# Patient Record
Sex: Female | Born: 1955 | Race: White
Health system: Southern US, Community
[De-identification: ages and names within clinical notes are randomized; demographics above are authoritative.]

## PROBLEM LIST (undated history)

## (undated) DIAGNOSIS — F329 Major depressive disorder, single episode, unspecified: Secondary | ICD-10-CM

## (undated) DIAGNOSIS — R001 Bradycardia, unspecified: Secondary | ICD-10-CM

## (undated) DIAGNOSIS — F32A Depression, unspecified: Secondary | ICD-10-CM

## (undated) DIAGNOSIS — F0390 Unspecified dementia without behavioral disturbance: Secondary | ICD-10-CM

## (undated) DIAGNOSIS — E876 Hypokalemia: Secondary | ICD-10-CM

## (undated) DIAGNOSIS — G309 Alzheimer's disease, unspecified: Secondary | ICD-10-CM

## (undated) DIAGNOSIS — F062 Psychotic disorder with delusions due to known physiological condition: Secondary | ICD-10-CM

## (undated) DIAGNOSIS — C50919 Malignant neoplasm of unspecified site of unspecified female breast: Secondary | ICD-10-CM

## (undated) DIAGNOSIS — I959 Hypotension, unspecified: Secondary | ICD-10-CM

## (undated) DIAGNOSIS — F0280 Dementia in other diseases classified elsewhere without behavioral disturbance: Secondary | ICD-10-CM

## (undated) HISTORY — PX: BREAST BIOPSY: SHX20

## (undated) HISTORY — DX: Hypotension, unspecified: I95.9

## (undated) HISTORY — DX: Hypokalemia: E87.6

## (undated) HISTORY — DX: Malignant neoplasm of unspecified site of unspecified female breast: C50.919

## (undated) HISTORY — DX: Depression, unspecified: F32.A

## (undated) HISTORY — DX: Major depressive disorder, single episode, unspecified: F32.9

## (undated) HISTORY — DX: Unspecified dementia, unspecified severity, without behavioral disturbance, psychotic disturbance, mood disturbance, and anxiety: F03.90

## (undated) HISTORY — PX: ABDOMINAL HYSTERECTOMY: SHX81

## (undated) HISTORY — DX: Dementia in other diseases classified elsewhere, unspecified severity, without behavioral disturbance, psychotic disturbance, mood disturbance, and anxiety: F02.80

## (undated) HISTORY — DX: Psychotic disorder with delusions due to known physiological condition: F06.2

## (undated) HISTORY — DX: Alzheimer's disease, unspecified: G30.9

## (undated) HISTORY — DX: Bradycardia, unspecified: R00.1

---

## 2018-03-29 ENCOUNTER — Other Ambulatory Visit (HOSPITAL_COMMUNITY): Payer: Self-pay | Admitting: Geriatric Medicine

## 2018-03-29 DIAGNOSIS — N63 Unspecified lump in unspecified breast: Secondary | ICD-10-CM

## 2018-04-04 ENCOUNTER — Ambulatory Visit (HOSPITAL_COMMUNITY)
Admission: RE | Admit: 2018-04-04 | Discharge: 2018-04-04 | Disposition: A | Discharge status: Home / Self Care | Payer: Medicaid Other | Source: Ambulatory Visit | Attending: Geriatric Medicine | Admitting: Geriatric Medicine

## 2018-04-04 ENCOUNTER — Other Ambulatory Visit (HOSPITAL_COMMUNITY): Payer: Self-pay | Admitting: Geriatric Medicine

## 2018-04-04 ENCOUNTER — Encounter (HOSPITAL_COMMUNITY): Payer: Self-pay | Admitting: Radiology

## 2018-04-04 DIAGNOSIS — N6320 Unspecified lump in the left breast, unspecified quadrant: Secondary | ICD-10-CM

## 2018-04-04 DIAGNOSIS — N63 Unspecified lump in unspecified breast: Secondary | ICD-10-CM

## 2018-04-04 DIAGNOSIS — N6321 Unspecified lump in the left breast, upper outer quadrant: Secondary | ICD-10-CM | POA: Insufficient documentation

## 2018-04-11 ENCOUNTER — Other Ambulatory Visit (HOSPITAL_COMMUNITY): Payer: Medicaid Other

## 2018-04-11 ENCOUNTER — Other Ambulatory Visit (HOSPITAL_COMMUNITY): Payer: Self-pay | Admitting: Family Medicine

## 2018-04-11 ENCOUNTER — Ambulatory Visit (HOSPITAL_COMMUNITY)
Admission: RE | Admit: 2018-04-11 | Discharge: 2018-04-11 | Disposition: A | Discharge status: Home / Self Care | Payer: Medicaid Other | Source: Ambulatory Visit | Attending: Geriatric Medicine | Admitting: Geriatric Medicine

## 2018-04-11 ENCOUNTER — Ambulatory Visit (HOSPITAL_COMMUNITY)
Admission: RE | Admit: 2018-04-11 | Discharge: 2018-04-11 | Disposition: A | Discharge status: Home / Self Care | Payer: Medicaid Other | Source: Ambulatory Visit | Attending: Family Medicine | Admitting: Family Medicine

## 2018-04-11 ENCOUNTER — Encounter (HOSPITAL_COMMUNITY): Payer: Self-pay

## 2018-04-11 DIAGNOSIS — R928 Other abnormal and inconclusive findings on diagnostic imaging of breast: Secondary | ICD-10-CM

## 2018-04-11 DIAGNOSIS — C50412 Malignant neoplasm of upper-outer quadrant of left female breast: Secondary | ICD-10-CM | POA: Insufficient documentation

## 2018-04-11 DIAGNOSIS — N6321 Unspecified lump in the left breast, upper outer quadrant: Secondary | ICD-10-CM | POA: Diagnosis present

## 2018-04-11 DIAGNOSIS — C773 Secondary and unspecified malignant neoplasm of axilla and upper limb lymph nodes: Secondary | ICD-10-CM | POA: Insufficient documentation

## 2018-04-11 DIAGNOSIS — N6320 Unspecified lump in the left breast, unspecified quadrant: Secondary | ICD-10-CM

## 2018-04-11 MED ORDER — LIDOCAINE HCL (PF) 1 % IJ SOLN
INTRAMUSCULAR | Status: AC
  Filled 2018-04-11: qty 5

## 2018-04-11 MED ORDER — LIDOCAINE-EPINEPHRINE (PF) 1 %-1:200000 IJ SOLN
INTRAMUSCULAR | Status: AC
  Administered 2018-04-11: 6 mL
  Filled 2018-04-11: qty 30

## 2018-04-11 MED ORDER — LIDOCAINE HCL (PF) 1 % IJ SOLN
INTRAMUSCULAR | Status: AC
  Administered 2018-04-11: 5 mL
  Filled 2018-04-11: qty 5

## 2018-04-28 ENCOUNTER — Inpatient Hospital Stay (HOSPITAL_COMMUNITY): Payer: Medicaid Other

## 2018-04-28 ENCOUNTER — Encounter (HOSPITAL_COMMUNITY): Payer: Self-pay | Admitting: Internal Medicine

## 2018-04-28 ENCOUNTER — Inpatient Hospital Stay (HOSPITAL_COMMUNITY): Payer: Medicaid Other | Attending: Internal Medicine | Admitting: Internal Medicine

## 2018-04-28 VITALS — BP 117/62 | HR 66 | Temp 98.1°F | Resp 14 | Ht 61.5 in | Wt 160.0 lb

## 2018-04-28 DIAGNOSIS — Z87891 Personal history of nicotine dependence: Secondary | ICD-10-CM | POA: Diagnosis not present

## 2018-04-28 DIAGNOSIS — R911 Solitary pulmonary nodule: Secondary | ICD-10-CM | POA: Insufficient documentation

## 2018-04-28 DIAGNOSIS — C50812 Malignant neoplasm of overlapping sites of left female breast: Secondary | ICD-10-CM

## 2018-04-28 DIAGNOSIS — F0390 Unspecified dementia without behavioral disturbance: Secondary | ICD-10-CM | POA: Diagnosis not present

## 2018-04-28 DIAGNOSIS — Z171 Estrogen receptor negative status [ER-]: Secondary | ICD-10-CM | POA: Diagnosis not present

## 2018-04-28 LAB — CBC WITH DIFFERENTIAL/PLATELET
BASOS ABS: 0 10*3/uL (ref 0.0–0.1)
BASOS PCT: 0 %
Eosinophils Absolute: 0.1 10*3/uL (ref 0.0–0.7)
Eosinophils Relative: 1 %
HEMATOCRIT: 40.9 % (ref 36.0–46.0)
Hemoglobin: 13.1 g/dL (ref 12.0–15.0)
LYMPHS ABS: 2.7 10*3/uL (ref 0.7–4.0)
LYMPHS PCT: 35 %
MCH: 29.6 pg (ref 26.0–34.0)
MCHC: 32 g/dL (ref 30.0–36.0)
MCV: 92.3 fL (ref 78.0–100.0)
MONO ABS: 0.8 10*3/uL (ref 0.1–1.0)
Monocytes Relative: 10 %
NEUTROS ABS: 4.1 10*3/uL (ref 1.7–7.7)
NEUTROS PCT: 54 %
Platelets: 267 10*3/uL (ref 150–400)
RBC: 4.43 MIL/uL (ref 3.87–5.11)
RDW: 13.5 % (ref 11.5–15.5)
WBC: 7.7 10*3/uL (ref 4.0–10.5)

## 2018-04-28 LAB — COMPREHENSIVE METABOLIC PANEL
ALBUMIN: 3.9 g/dL (ref 3.5–5.0)
ALT: 14 U/L (ref 0–44)
AST: 16 U/L (ref 15–41)
Alkaline Phosphatase: 80 U/L (ref 38–126)
Anion gap: 9 (ref 5–15)
BILIRUBIN TOTAL: 0.6 mg/dL (ref 0.3–1.2)
BUN: 12 mg/dL (ref 8–23)
CALCIUM: 9.2 mg/dL (ref 8.9–10.3)
CO2: 28 mmol/L (ref 22–32)
CREATININE: 0.64 mg/dL (ref 0.44–1.00)
Chloride: 103 mmol/L (ref 98–111)
GFR calc Af Amer: >60 mL/min (ref >60)
GLUCOSE: 117 mg/dL — AB (ref 70–99)
Potassium: 4 mmol/L (ref 3.5–5.1)
Sodium: 140 mmol/L (ref 135–145)
TOTAL PROTEIN: 7.5 g/dL (ref 6.5–8.1)

## 2018-04-28 LAB — LACTATE DEHYDROGENASE: LDH: 135 U/L (ref 98–192)

## 2018-04-28 NOTE — Progress Notes (Signed)
Referring Physician:  [facility]  Diagnosis Malignant neoplasm of overlapping sites of left breast in female, estrogen receptor negative ([place]) - Plan: CBC with Differential/Platelet, Comprehensive metabolic panel, Lactate dehydrogenase, Cancer antigen 27.29, Cancer antigen 15-3, NM PET Image Initial (PI) Skull Base To Thigh, ECHOCARDIOGRAM COMPLETE  Staging Cancer Staging No matching staging information was found for the patient.  Assessment and Plan: 1.  Stage II left breast cancer.  62 yr old female who resides in [facility] and has been in nursing facility since 04/2017.  She reports she felt an area in left breast and underwent evaluation with bilateral diagnostic mammogram on 04/04/2018 that showed  IMPRESSION: 1. Highly suspicious left breast mass with associated calcifications measuring 4.6 cm sonographically.  2. Suspicious lymph node in the left axilla with focal nodular cortical thickening.  3.  No findings of malignancy in the right breast.  She had a biopsy of the left breast lesion 2:00 position and left axillary lymph node on 04/11/2018.  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  I have discussed with the patient, nursing home worker, and her sister who has power of attorney via phone that patient currently has a stage II breast cancer.  She will be recommended for staging evaluation with a PET scan.  I have also discussed with them due to the findings of HER-2 positive breast cancer and the size of the lesion in the left breast as well as axillary adenopathy she will be recommended for neoadjuvant chemotherapy with a Herceptin based regimen.  Potential side effects of medications were reviewed with her sister who is power of attorney via phone.  The patient is set up for PET scan for staging as well as echo for heart function evaluation prior to therapy.   She will return to clinic to go over  the results.  She will not be a candidate for endocrine therapy as her tumor is hormone receptor negative.  She will also be referred to surgery for evaluation for port placement.  She will be set up for chemotherapy teaching once the results of her scan and ECHO  have been reviewed and treatment plan finalized.  Baseline labs obtained today and reviewed and were WNL.  All questions answered and pt expressed understanding of the information presented.    2.  Dementia.  Sister reports this affected several family members.  Pt is alert and conversant.  She is currently in [facility].    3.  Health maintenance.  Pt was not getting mammograms since being in facility.  She is not sure when last colonoscopy was done.    Greater than 60 minutes spent with more than 50% spent in counseling and coordination of care.    HPI: 62 year old female referred for evaluation due to breast cancer.  She is accompanied by nursing home worker.  Patient reportedly has been in nursing facility since September 2018.  She reportedly was diagnosed with dementia which reportedly runs in her family.  Power of attorney is available via phone.  The patient reportedly noted an area in the left breast and brought that to the facility's attention.       She subsequently had bilateral diagnostic mammogram done on 04/04/2018 that showed  IMPRESSION: 1. Highly suspicious left breast mass with associated calcifications measuring 4.6 cm sonographically.  2. Suspicious lymph node in the left axilla with focal nodular cortical thickening.  3.  No findings of malignancy in the right breast.  RECOMMENDATION: 1. Recommend ultrasound-guided biopsy of the palpable mass in the left breast at the 1 to 2 o'clock position.  2. Recommend ultrasound-guided biopsy of the abnormal lymph node in the left axilla.     Patient underwent ultrasound-guided biopsy of the left breast on 04/11/2018.  Clip was placed. Pathology returned as  : ADDITIONAL INFORMATION: 1. Immunohistochemical stain for E-cadherin is positive, consistent with a ductal phenotype. Jaquita Folds MD Pathologist, Electronic Signature ( Signed 04/14/2018) 1. PROGNOSTIC INDICATORS Results: IMMUNOHISTOCHEMICAL AND MORPHOMETRIC ANALYSIS PERFORMED MANUALLY The tumor cells are Positive for Her2 (3+). Estrogen Receptor: 0%, NEGATIVE Progesterone Receptor: 0%, NEGATIVE Proliferation Marker Ki67: 50% REFERENCE RANGE ESTROGEN RECEPTOR NEGATIVE 0% POSITIVE =>1% REFERENCE RANGE PROGESTERONE RECEPTOR NEGATIVE 0% POSITIVE =>1% All controls stained appropriately Vicente Males MD Pathologist, Electronic Signature ( Signed 04/14/2018) FINAL DIAGNOSIS 1 of 3 FINAL for [place], [place] ([id number]) Diagnosis 1. Breast, left, needle core biopsy, 2:00 - INVASIVE MAMMARY CARCINOMA. SEE NOTE. 2. Lymph node, needle/core biopsy, left axilla - INVOLVED BY MAMMARY CARCINOMA  Patient is doing well since biopsy.  She denies any headache, blurred vision, shortness of breath, weight loss, abdominal pain.  She is seen today for consultation due to recent diagnosis of left breast cancer.  Problem List There are no active problems to display for this patient.   Past Medical History Past Medical History:  Diagnosis Date  . Alzheimer's dementia   . Bradycardia   . Breast cancer ([place])   . Dementia   . Depression   . Hypokalemia   . Hypotension   . 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 BREAST BIOPSY Left     Family History History reviewed. No pertinent family history.   Social History  has an unknown smoking status. She has never used smokeless tobacco. She reports that she drank alcohol. She reports that she does not use drugs.  Medications  Current Outpatient Medications:  .  ALPRAZolam (XANAX) 0.25 MG tablet, Take 0.25 mg by mouth 4 (four)  times daily as needed for anxiety., Disp: , Rfl:  .  cholecalciferol (VITAMIN D) 1000 units tablet, Take 1,000 Units by mouth daily., Disp: , Rfl:  .  divalproex (DEPAKOTE) 250 MG DR tablet, Take 250 mg by mouth at bedtime., Disp: , Rfl:  .  divalproex (DEPAKOTE) 500 MG DR tablet, Take 500 mg by mouth every morning., Disp: , Rfl:  .  donepezil (ARICEPT) 10 MG tablet, Take 10 mg by mouth at bedtime., Disp: , Rfl:  .  memantine (NAMENDA) 10 MG tablet, Take 10 mg by mouth 2 (two) times daily., Disp: , Rfl:  .  QUEtiapine (SEROQUEL) 25 MG tablet, Take 12.5 mg by mouth 3 (three) times daily., Disp: , Rfl:  .  sertraline (ZOLOFT) 100 MG tablet, Take 100 mg by mouth daily., Disp: , Rfl:   Allergies Patient has no known allergies.  Review of Systems Review of Systems - Oncology ROS negative.     Physical Exam  Vitals Wt Readings from Last 3 Encounters:  04/28/18 160 lb (72.6 kg)   Temp Readings from Last 3 Encounters:  04/28/18 98.1 F (36.7 C) (Oral)  04/11/18 (!) 97.5 F (36.4 C) (Oral)   BP Readings from Last 3 Encounters:  04/28/18 117/62  04/11/18 (!) 127/56   Pulse Readings from Last 3 Encounters:  04/28/18 66  04/11/18 60  Constitutional: Well-developed, well-nourished, and in no distress.   HENT: Head: Normocephalic and atraumatic.  Mouth/Throat: No oropharyngeal exudate. Mucosa moist. Eyes: Pupils are equal, round, and reactive to light. Conjunctivae are normal. No scleral icterus.  Neck: Normal range of motion. Neck supple. No JVD present.  Cardiovascular: Normal rate, regular rhythm and normal heart sounds.  Exam reveals no gallop and no friction rub.   No murmur heard. Pulmonary/Chest: Effort normal and breath sounds normal. No respiratory distress. No wheezes.No rales.  Abdominal: Soft. Bowel sounds are normal. No distension. There is no tenderness. There is no guarding.  Musculoskeletal: No edema or tenderness.  Lymphadenopathy: No cervical, axillary or  supraclavicular adenopathy.  Neurological: Alert and oriented to person, place, and time. No cranial nerve deficit.  Skin: Skin is warm and dry. No rash noted. No erythema. No pallor.  Psychiatric: Affect and judgment normal.  Breast exam:  Palpable 4 cm lesion in left breast at 2:00 position.  Right breast shows no dominant palpable masses.    Labs Appointment on 04/28/2018  Component Date Value Ref Range Status  . WBC 04/28/2018 7.7  4.0 - 10.5 K/uL Final  . RBC 04/28/2018 4.43  3.87 - 5.11 MIL/uL Final  . Hemoglobin 04/28/2018 13.1  12.0 - 15.0 g/dL Final  . HCT 04/28/2018 40.9  36.0 - 46.0 % Final  . MCV 04/28/2018 92.3  78.0 - 100.0 fL Final  . MCH 04/28/2018 29.6  26.0 - 34.0 pg Final  . MCHC 04/28/2018 32.0  30.0 - 36.0 g/dL Final  . RDW 04/28/2018 13.5  11.5 - 15.5 % Final  . Platelets 04/28/2018 267  150 - 400 K/uL Final  . Neutrophils Relative % 04/28/2018 54  % Final  . Neutro Abs 04/28/2018 4.1  1.7 - 7.7 K/uL Final  . Lymphocytes Relative 04/28/2018 35  % Final  . Lymphs Abs 04/28/2018 2.7  0.7 - 4.0 K/uL Final  . Monocytes Relative 04/28/2018 10  % Final  . Monocytes Absolute 04/28/2018 0.8  0.1 - 1.0 K/uL Final  . Eosinophils Relative 04/28/2018 1  % Final  . Eosinophils Absolute 04/28/2018 0.1  0.0 - 0.7 K/uL Final  . Basophils Relative 04/28/2018 0  % Final  . Basophils Absolute 04/28/2018 0.0  0.0 - 0.1 K/uL Final   Performed at [facility], [address] [place], [place] [address]  . Sodium 04/28/2018 140  135 - 145 mmol/L Final  . Potassium 04/28/2018 4.0  3.5 - 5.1 mmol/L Final  . Chloride 04/28/2018 103  98 - 111 mmol/L Final  . CO2 04/28/2018 28  22 - 32 mmol/L Final  . Glucose, Bld 04/28/2018 117* 70 - 99 mg/dL Final  . BUN 04/28/2018 12  8 - 23 mg/dL Final  . Creatinine, Ser 04/28/2018 0.64  0.44 - 1.00 mg/dL Final  . Calcium 04/28/2018 9.2  8.9 - 10.3 mg/dL Final  . Total Protein 04/28/2018 7.5  6.5 - 8.1 g/dL Final  . Albumin 04/28/2018 3.9  3.5 -  5.0 g/dL Final  . AST 04/28/2018 16  15 - 41 U/L Final  . ALT 04/28/2018 14  0 - 44 U/L Final  . Alkaline Phosphatase 04/28/2018 80  38 - 126 U/L Final  . Total Bilirubin 04/28/2018 0.6  0.3 - 1.2 mg/dL Final  . GFR calc non Af Amer 04/28/2018 >60  >60 mL/min Final  . GFR calc Af Amer 04/28/2018 >60  >60 mL/min Final   Comment: (NOTE) The eGFR has been calculated using the CKD  EPI equation. This calculation has not been validated in all clinical situations. eGFR's persistently <60 mL/min signify possible Chronic Kidney Disease.   Georgiann Hahn gap 04/28/2018 9  5 - 15 Final   Performed at [facility], [address] [place], [place] [address]  . LDH 04/28/2018 135  98 - 192 U/L Final   Performed at [facility], [address] [place], [place] [address]     Pathology Orders Placed This Encounter  Procedures  . NM PET Image Initial (PI) Skull Base To Thigh    Standing Status:   Future    Standing Expiration Date:   04/28/2019    Order Specific Question:   If indicated for the ordered procedure, I authorize the administration of a radiopharmaceutical per Radiology protocol    Answer:   Yes    Order Specific Question:   Preferred imaging location?    Answer:   [facility]    Order Specific Question:   Radiology Contrast Protocol - do NOT remove file path    Answer:   [email or web address]  . CBC with Differential/Platelet    Standing Status:   Future    Number of Occurrences:   1    Standing Expiration Date:   04/29/2019  . Comprehensive metabolic panel    Standing Status:   Future    Number of Occurrences:   1    Standing Expiration Date:   04/29/2019  . Lactate dehydrogenase    Standing Status:   Future    Number of Occurrences:   1    Standing Expiration Date:   04/29/2019  . Cancer antigen 27.29    Standing Status:   Future    Number of Occurrences:   1    Standing Expiration Date:   04/29/2019  . Cancer antigen 15-3    Standing Status:   Future     Number of Occurrences:   1    Standing Expiration Date:   04/29/2019  . ECHOCARDIOGRAM COMPLETE    Standing Status:   Future    Standing Expiration Date:   07/29/2019    Order Specific Question:   Where should this test be performed    Answer:   [facility]    Order Specific Question:   Perflutren DEFINITY (image enhancing agent) should be administered unless hypersensitivity or allergy exist    Answer:   Administer Perflutren    Order Specific Question:   Other Comments    Answer:   heart function evaluation prior to herceptin       Zoila Shutter MD

## 2018-04-28 NOTE — Patient Instructions (Signed)
[facility] Discharge Instructions  You saw Dr. Higgs today.   Thank you for choosing [facility] at [facility] to provide your oncology and hematology care.  To afford each patient quality time with our provider, please arrive at least 15 minutes before your scheduled appointment time.   If you have a lab appointment with the Cancer Center please come in thru the  Main Entrance and check in at the main information desk  You need to re-schedule your appointment should you arrive 10 or more minutes late.  We strive to give you quality time with our providers, and arriving late affects you and other patients whose appointments are after yours.  Also, if you no show three or more times for appointments you may be dismissed from the clinic at the providers discretion.     Again, thank you for choosing [facility].  Our hope is that these requests will decrease the amount of time that you wait before being seen by our physicians.       _____________________________________________________________  Should you have questions after your visit to [facility], please contact our office at [phone] between the hours of 8:00 a.m. and 4:30 p.m.  Voicemails left after 4:00 p.m. will not be returned until the following business day.  For prescription refill requests, have your pharmacy contact our office and allow 72 hours.    Cancer Center Support Programs:   > Cancer Support Group  2nd Tuesday of the month 1pm-2pm, Journey Room    

## 2018-04-29 LAB — CANCER ANTIGEN 27.29: CAN 27.29: 23.8 U/mL (ref 0.0–38.6)

## 2018-04-29 LAB — CANCER ANTIGEN 15-3: CA 15-3: 22.6 U/mL (ref 0.0–25.0)

## 2018-05-03 ENCOUNTER — Ambulatory Visit (HOSPITAL_COMMUNITY)
Admission: RE | Admit: 2018-05-03 | Discharge: 2018-05-03 | Disposition: A | Discharge status: Home / Self Care | Payer: Medicaid Other | Source: Ambulatory Visit | Attending: Internal Medicine | Admitting: Internal Medicine

## 2018-05-03 ENCOUNTER — Other Ambulatory Visit (HOSPITAL_COMMUNITY): Payer: Medicaid Other

## 2018-05-03 DIAGNOSIS — G309 Alzheimer's disease, unspecified: Secondary | ICD-10-CM | POA: Diagnosis not present

## 2018-05-03 DIAGNOSIS — F0280 Dementia in other diseases classified elsewhere without behavioral disturbance: Secondary | ICD-10-CM | POA: Insufficient documentation

## 2018-05-03 DIAGNOSIS — I351 Nonrheumatic aortic (valve) insufficiency: Secondary | ICD-10-CM | POA: Diagnosis not present

## 2018-05-03 DIAGNOSIS — C50812 Malignant neoplasm of overlapping sites of left female breast: Secondary | ICD-10-CM | POA: Diagnosis not present

## 2018-05-03 DIAGNOSIS — C50919 Malignant neoplasm of unspecified site of unspecified female breast: Secondary | ICD-10-CM | POA: Insufficient documentation

## 2018-05-03 DIAGNOSIS — R001 Bradycardia, unspecified: Secondary | ICD-10-CM | POA: Diagnosis not present

## 2018-05-03 DIAGNOSIS — Z171 Estrogen receptor negative status [ER-]: Secondary | ICD-10-CM | POA: Diagnosis present

## 2018-05-03 NOTE — Progress Notes (Signed)
*  PRELIMINARY RESULTS* Echocardiogram 2D Echocardiogram has been performed.  Leavy Cella 05/03/2018, 3:27 PM

## 2018-05-09 ENCOUNTER — Ambulatory Visit (INDEPENDENT_AMBULATORY_CARE_PROVIDER_SITE_OTHER): Payer: Medicaid Other | Admitting: General Surgery

## 2018-05-09 ENCOUNTER — Encounter: Payer: Self-pay | Admitting: General Surgery

## 2018-05-09 VITALS — BP 131/66 | HR 65 | Temp 97.8°F | Resp 16 | Wt 158.0 lb

## 2018-05-09 DIAGNOSIS — C50812 Malignant neoplasm of overlapping sites of left female breast: Secondary | ICD-10-CM | POA: Diagnosis not present

## 2018-05-09 NOTE — Progress Notes (Signed)
[name] [name]; [id number]; June 07, 1956   HPI Patient is a 62 year old white female who was referred to my care by Dr. Walden Field for Port-A-Cath placement.  Patient has left breast carcinoma is about to undergo chemotherapy.  Patient denies any pain.  Patient resides at [facility] facility Past Medical History:  Diagnosis Date  . Alzheimer's dementia   . Bradycardia   . Breast cancer ([place])   . Dementia   . Depression   . Hypokalemia   . Hypotension   . 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 BREAST BIOPSY Left     History reviewed. No pertinent family history.  Current Outpatient Medications on File Prior to Visit  Medication Sig Dispense Refill  . ALPRAZolam (XANAX) 0.25 MG tablet Take 0.25 mg by mouth 4 (four) times daily as needed for anxiety.    . cholecalciferol (VITAMIN D) 1000 units tablet Take 1,000 Units by mouth daily.    . divalproex (DEPAKOTE) 250 MG DR tablet Take 250 mg by mouth at bedtime.    . divalproex (DEPAKOTE) 500 MG DR tablet Take 500 mg by mouth every morning.    . donepezil (ARICEPT) 10 MG tablet Take 10 mg by mouth at bedtime.    . memantine (NAMENDA) 10 MG tablet Take 10 mg by mouth 2 (two) times daily.    . QUEtiapine (SEROQUEL) 25 MG tablet Take 12.5 mg by mouth 3 (three) times daily.    . sertraline (ZOLOFT) 100 MG tablet Take 100 mg by mouth daily.     No current facility-administered medications on file prior to visit.     No Known Allergies  Social History   Substance and Sexual Activity  Alcohol Use Not Currently    Social History   Tobacco Use  Smoking Status Unknown If Ever Smoked  Smokeless Tobacco Never Used    Review of Systems  Constitutional: Negative.   HENT: Negative.   Eyes: Negative.   Respiratory: Negative.   Cardiovascular: Negative.   Gastrointestinal: Negative.   Genitourinary: Negative.   Musculoskeletal: Negative.    Skin: Positive for rash.  Neurological: Negative.   Endo/Heme/Allergies: Negative.   Psychiatric/Behavioral: Negative.     Objective   Vitals:   05/09/18 0857  BP: 131/66  Pulse: 65  Resp: 16  Temp: 97.8 F (36.6 C)    Physical Exam  Constitutional: She is oriented to person, place, and time. She appears well-developed and well-nourished. No distress.  HENT:  Head: Normocephalic and atraumatic.  Cardiovascular: Normal rate, regular rhythm and normal heart sounds. Exam reveals no gallop and no friction rub.  No murmur heard. Pulmonary/Chest: Effort normal and breath sounds normal. No stridor. No respiratory distress. She has no wheezes. She has no rales.  Neurological: She is alert and oriented to person, place, and time.  Skin: Skin is warm and dry.  Vitals reviewed. Oncology notes reviewed  Assessment  Left breast carcinoma, need for central venous access Plan   Patient is scheduled for Port-A-Cath insertion on 05/15/2018.  The risks and benefits of the procedure including bleeding, infection, and pneumothorax were fully explained to the patient, who gave informed consent.

## 2018-05-09 NOTE — H&P (Signed)
[name] [name]; [id number]; 11/19/1955   HPI Patient is a 62 year old white female who was referred to my care by Dr. Walden Field for Port-A-Cath placement.  Patient has left breast carcinoma is about to undergo chemotherapy.  Patient denies any pain.  Patient resides at [facility] facility Past Medical History:  Diagnosis Date  . Alzheimer's dementia   . Bradycardia   . Breast cancer ([place])   . Dementia   . Depression   . Hypokalemia   . Hypotension   . 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 BREAST BIOPSY Left     History reviewed. No pertinent family history.  Current Outpatient Medications on File Prior to Visit  Medication Sig Dispense Refill  . ALPRAZolam (XANAX) 0.25 MG tablet Take 0.25 mg by mouth 4 (four) times daily as needed for anxiety.    . cholecalciferol (VITAMIN D) 1000 units tablet Take 1,000 Units by mouth daily.    . divalproex (DEPAKOTE) 250 MG DR tablet Take 250 mg by mouth at bedtime.    . divalproex (DEPAKOTE) 500 MG DR tablet Take 500 mg by mouth every morning.    . donepezil (ARICEPT) 10 MG tablet Take 10 mg by mouth at bedtime.    . memantine (NAMENDA) 10 MG tablet Take 10 mg by mouth 2 (two) times daily.    . QUEtiapine (SEROQUEL) 25 MG tablet Take 12.5 mg by mouth 3 (three) times daily.    . sertraline (ZOLOFT) 100 MG tablet Take 100 mg by mouth daily.     No current facility-administered medications on file prior to visit.     No Known Allergies  Social History   Substance and Sexual Activity  Alcohol Use Not Currently    Social History   Tobacco Use  Smoking Status Unknown If Ever Smoked  Smokeless Tobacco Never Used    Review of Systems  Constitutional: Negative.   HENT: Negative.   Eyes: Negative.   Respiratory: Negative.   Cardiovascular: Negative.   Gastrointestinal: Negative.   Genitourinary: Negative.   Musculoskeletal: Negative.    Skin: Positive for rash.  Neurological: Negative.   Endo/Heme/Allergies: Negative.   Psychiatric/Behavioral: Negative.     Objective   Vitals:   05/09/18 0857  BP: 131/66  Pulse: 65  Resp: 16  Temp: 97.8 F (36.6 C)    Physical Exam  Constitutional: She is oriented to person, place, and time. She appears well-developed and well-nourished. No distress.  HENT:  Head: Normocephalic and atraumatic.  Cardiovascular: Normal rate, regular rhythm and normal heart sounds. Exam reveals no gallop and no friction rub.  No murmur heard. Pulmonary/Chest: Effort normal and breath sounds normal. No stridor. No respiratory distress. She has no wheezes. She has no rales.  Neurological: She is alert and oriented to person, place, and time.  Skin: Skin is warm and dry.  Vitals reviewed. Oncology notes reviewed  Assessment  Left breast carcinoma, need for central venous access Plan   Patient is scheduled for Port-A-Cath insertion on 05/15/2018.  The risks and benefits of the procedure including bleeding, infection, and pneumothorax were fully explained to the patient, who gave informed consent.

## 2018-05-09 NOTE — Patient Instructions (Signed)
Implanted Port Insertion  Implanted port insertion is a procedure to put in a port and catheter. The port is a device with an injectable disk that can be accessed by your health care provider. The port is connected to a vein in the chest or neck by a small flexible tube (catheter). There are different types of ports. The implanted port may be used as a long-term IV access for:  · Medicines, such as chemotherapy.  · Fluids.  · Liquid nutrition, such as total parenteral nutrition (TPN).  · Blood samples.    Having a port means that your health care provider will not need to use the veins in your arms for these procedures.  Tell a health care provider about:  · Any allergies you have.  · All medicines you are taking, especially blood thinners, as well as any vitamins, herbs, eye drops, creams, over-the-counter medicines, and steroids.  · Any problems you or family members have had with anesthetic medicines.  · Any blood disorders you have.  · Any surgeries you have had.  · Any medical conditions you have, including diabetes or kidney problems.  · Whether you are pregnant or may be pregnant.  What are the risks?  Generally, this is a safe procedure. However, problems may occur, including:  · Allergic reactions to medicines or dyes.  · Damage to other structures or organs.  · Infection.  · Damage to the blood vessel, bruising, or bleeding at the puncture site.  · Blood clot.  · Breakdown of the skin over the port.  · A collection of air in the chest that can cause one of the lungs to collapse (pneumothorax). This is rare.    What happens before the procedure?  Staying hydrated  Follow instructions from your health care provider about hydration, which may include:  · Up to 2 hours before the procedure - you may continue to drink clear liquids, such as water, clear fruit juice, black coffee, and plain tea.    Eating and drinking restrictions  · Follow instructions from your health care provider about eating and drinking,  which may include:  ? 8 hours before the procedure - stop eating heavy meals or foods such as meat, fried foods, or fatty foods.  ? 6 hours before the procedure - stop eating light meals or foods, such as toast or cereal.  ? 6 hours before the procedure - stop drinking milk or drinks that contain milk.  ? 2 hours before the procedure - stop drinking clear liquids.  Medicines  · Ask your health care provider about:  ? Changing or stopping your regular medicines. This is especially important if you are taking diabetes medicines or blood thinners.  ? Taking medicines such as aspirin and ibuprofen. These medicines can thin your blood. Do not take these medicines before your procedure if your health care provider instructs you not to.  · You may be given antibiotic medicine to help prevent infection.  General instructions  · Plan to have someone take you home from the hospital or clinic.  · If you will be going home right after the procedure, plan to have someone with you for 24 hours.  · You may have blood tests.  · You may be asked to shower with a germ-killing soap.  What happens during the procedure?  · To lower your risk of infection:  ? Your health care team will wash or sanitize their hands.  ? Your skin will be washed with   soap.  ? Hair may be removed from the surgical area.  · An IV tube will be inserted into one of your veins.  · You will be given one or more of the following:  ? A medicine to help you relax (sedative).  ? A medicine to numb the area (local anesthetic).  · Two small cuts (incisions) will be made to insert the port.  ? One incision will be made in your neck to get access to the vein where the catheter will lie.  ? The other incision will be made in the upper chest. This is where the port will lie.  · The procedure may be done using continuous X-ray (fluoroscopy) or other imaging tools for guidance.  · The port and catheter will be placed. There may be a small, raised area where the port  is.  · The port will be flushed with a salt solution (saline), and blood will be drawn to make sure that it is working correctly.  · The incisions will be closed.  · Bandages (dressings) may be placed over the incisions.  The procedure may vary among health care providers and hospitals.  What happens after the procedure?  · Your blood pressure, heart rate, breathing rate, and blood oxygen level will be monitored until the medicines you were given have worn off.  · Do not drive for 24 hours if you were given a sedative.  · You will be given a manufacturer's information card for the type of port that you have. Keep this with you.  · Your port will need to be flushed and checked as told by your health care provider, usually every few weeks.  · A chest X-ray will be done to:  ? Check the placement of the port.  ? Make sure there is no injury to your lung.  Summary  · Implanted port insertion is a procedure to put in a port and catheter.  · The implanted port is used as a long-term IV access.  · The port will need to be flushed and checked as told by your health care provider, usually every few weeks.  · Keep your manufacturer's information card with you at all times.  This information is not intended to replace advice given to you by your health care provider. Make sure you discuss any questions you have with your health care provider.  Document Released: 05/30/2013 Document Revised: 06/30/2016 Document Reviewed: 06/30/2016  Elsevier Interactive Patient Education © 2017 [employer]

## 2018-05-11 ENCOUNTER — Ambulatory Visit (HOSPITAL_COMMUNITY)
Admission: RE | Admit: 2018-05-11 | Discharge: 2018-05-11 | Disposition: A | Discharge status: Home / Self Care | Payer: Medicaid Other | Source: Ambulatory Visit | Attending: Internal Medicine | Admitting: Internal Medicine

## 2018-05-11 DIAGNOSIS — C50812 Malignant neoplasm of overlapping sites of left female breast: Secondary | ICD-10-CM | POA: Insufficient documentation

## 2018-05-11 DIAGNOSIS — C773 Secondary and unspecified malignant neoplasm of axilla and upper limb lymph nodes: Secondary | ICD-10-CM | POA: Insufficient documentation

## 2018-05-11 DIAGNOSIS — R918 Other nonspecific abnormal finding of lung field: Secondary | ICD-10-CM | POA: Diagnosis not present

## 2018-05-11 DIAGNOSIS — Z171 Estrogen receptor negative status [ER-]: Secondary | ICD-10-CM | POA: Insufficient documentation

## 2018-05-11 DIAGNOSIS — N6320 Unspecified lump in the left breast, unspecified quadrant: Secondary | ICD-10-CM | POA: Diagnosis not present

## 2018-05-11 LAB — GLUCOSE, CAPILLARY: GLUCOSE-CAPILLARY: 81 mg/dL (ref 70–99)

## 2018-05-11 MED ORDER — FLUDEOXYGLUCOSE F - 18 (FDG) INJECTION
7.8000 | Freq: Once | INTRAVENOUS | Status: AC
  Administered 2018-05-11: 7.8 via INTRAVENOUS

## 2018-05-12 ENCOUNTER — Encounter (HOSPITAL_COMMUNITY): Payer: Self-pay

## 2018-05-12 ENCOUNTER — Encounter (HOSPITAL_COMMUNITY)
Admission: RE | Admit: 2018-05-12 | Discharge: 2018-05-12 | Disposition: A | Discharge status: Home / Self Care | Payer: Medicaid Other | Source: Ambulatory Visit | Attending: General Surgery | Admitting: General Surgery

## 2018-05-15 ENCOUNTER — Ambulatory Visit (HOSPITAL_COMMUNITY): Payer: Medicaid Other

## 2018-05-15 ENCOUNTER — Encounter (HOSPITAL_COMMUNITY): Payer: Self-pay | Admitting: *Deleted

## 2018-05-15 ENCOUNTER — Encounter (HOSPITAL_COMMUNITY)
Admission: RE | Disposition: A | Discharge status: Home / Self Care | Payer: Self-pay | Source: Ambulatory Visit | Attending: General Surgery

## 2018-05-15 ENCOUNTER — Ambulatory Visit (HOSPITAL_COMMUNITY): Payer: Medicaid Other | Admitting: Anesthesiology

## 2018-05-15 ENCOUNTER — Ambulatory Visit (HOSPITAL_COMMUNITY)
Admission: RE | Admit: 2018-05-15 | Discharge: 2018-05-15 | Disposition: A | Discharge status: Home / Self Care | Payer: Medicaid Other | Source: Ambulatory Visit | Attending: General Surgery | Admitting: General Surgery

## 2018-05-15 ENCOUNTER — Other Ambulatory Visit: Payer: Self-pay

## 2018-05-15 DIAGNOSIS — C50912 Malignant neoplasm of unspecified site of left female breast: Secondary | ICD-10-CM | POA: Insufficient documentation

## 2018-05-15 DIAGNOSIS — Z95828 Presence of other vascular implants and grafts: Secondary | ICD-10-CM

## 2018-05-15 DIAGNOSIS — Z79899 Other long term (current) drug therapy: Secondary | ICD-10-CM | POA: Diagnosis not present

## 2018-05-15 DIAGNOSIS — F329 Major depressive disorder, single episode, unspecified: Secondary | ICD-10-CM | POA: Insufficient documentation

## 2018-05-15 DIAGNOSIS — F0280 Dementia in other diseases classified elsewhere without behavioral disturbance: Secondary | ICD-10-CM | POA: Diagnosis not present

## 2018-05-15 DIAGNOSIS — G309 Alzheimer's disease, unspecified: Secondary | ICD-10-CM | POA: Insufficient documentation

## 2018-05-15 DIAGNOSIS — C50812 Malignant neoplasm of overlapping sites of left female breast: Secondary | ICD-10-CM | POA: Diagnosis not present

## 2018-05-15 HISTORY — PX: PORTACATH PLACEMENT: SHX2246

## 2018-05-15 SURGERY — INSERTION, TUNNELED CENTRAL VENOUS DEVICE, WITH PORT
Anesthesia: Monitor Anesthesia Care | Site: Chest | Laterality: Right

## 2018-05-15 MED ORDER — LIDOCAINE HCL (PF) 1 % IJ SOLN
INTRAMUSCULAR | Status: AC
  Filled 2018-05-15: qty 15

## 2018-05-15 MED ORDER — HYDROMORPHONE HCL 1 MG/ML IJ SOLN: 0.2500 mg | INTRAMUSCULAR | Status: DC | PRN

## 2018-05-15 MED ORDER — SODIUM CHLORIDE 0.9 % IV SOLN
INTRAVENOUS | Status: AC | PRN
  Administered 2018-05-15: 500 mL via INTRAMUSCULAR

## 2018-05-15 MED ORDER — LIDOCAINE HCL (CARDIAC) PF 50 MG/5ML IV SOSY
PREFILLED_SYRINGE | INTRAVENOUS | Status: DC | PRN
  Administered 2018-05-15: 40 mg via INTRAVENOUS

## 2018-05-15 MED ORDER — LACTATED RINGERS IV SOLN
INTRAVENOUS | Status: DC
  Administered 2018-05-15: 08:00:00 via INTRAVENOUS

## 2018-05-15 MED ORDER — HYDROCODONE-ACETAMINOPHEN 7.5-325 MG PO TABS: 1.0000 | ORAL_TABLET | Freq: Once | ORAL | Status: DC | PRN

## 2018-05-15 MED ORDER — LIDOCAINE HCL (PF) 1 % IJ SOLN
INTRAMUSCULAR | Status: DC | PRN
  Administered 2018-05-15: 10 mL

## 2018-05-15 MED ORDER — LACTATED RINGERS IV SOLN: INTRAVENOUS | Status: DC

## 2018-05-15 MED ORDER — FENTANYL CITRATE (PF) 100 MCG/2ML IJ SOLN
INTRAMUSCULAR | Status: DC | PRN
  Administered 2018-05-15: 25 ug via INTRAVENOUS

## 2018-05-15 MED ORDER — PROPOFOL 10 MG/ML IV BOLUS
INTRAVENOUS | Status: AC
  Filled 2018-05-15: qty 40

## 2018-05-15 MED ORDER — CEFAZOLIN SODIUM-DEXTROSE 2-4 GM/100ML-% IV SOLN
2.0000 g | INTRAVENOUS | Status: AC
  Administered 2018-05-15: 2 g via INTRAVENOUS
  Filled 2018-05-15: qty 100

## 2018-05-15 MED ORDER — CHLORHEXIDINE GLUCONATE CLOTH 2 % EX PADS: 6.0000 | MEDICATED_PAD | Freq: Once | CUTANEOUS | Status: DC

## 2018-05-15 MED ORDER — FENTANYL CITRATE (PF) 100 MCG/2ML IJ SOLN
INTRAMUSCULAR | Status: AC
  Filled 2018-05-15: qty 2

## 2018-05-15 MED ORDER — PROMETHAZINE HCL 25 MG/ML IJ SOLN: 6.2500 mg | INTRAMUSCULAR | Status: DC | PRN

## 2018-05-15 MED ORDER — KETOROLAC TROMETHAMINE 30 MG/ML IJ SOLN
30.0000 mg | Freq: Once | INTRAMUSCULAR | Status: AC
  Administered 2018-05-15: 30 mg via INTRAVENOUS
  Filled 2018-05-15: qty 1

## 2018-05-15 MED ORDER — HEPARIN SOD (PORK) LOCK FLUSH 100 UNIT/ML IV SOLN
INTRAVENOUS | Status: AC
  Filled 2018-05-15: qty 5

## 2018-05-15 MED ORDER — PROPOFOL 500 MG/50ML IV EMUL
INTRAVENOUS | Status: DC | PRN
  Administered 2018-05-15: 150 ug/kg/min via INTRAVENOUS

## 2018-05-15 MED ORDER — MEPERIDINE HCL 50 MG/ML IJ SOLN: 6.2500 mg | INTRAMUSCULAR | Status: DC | PRN

## 2018-05-15 MED ORDER — PROPOFOL 10 MG/ML IV BOLUS
INTRAVENOUS | Status: DC | PRN
  Administered 2018-05-15 (×2): 20 mg via INTRAVENOUS

## 2018-05-15 MED ORDER — HEPARIN SOD (PORK) LOCK FLUSH 100 UNIT/ML IV SOLN
INTRAVENOUS | Status: DC | PRN
  Administered 2018-05-15: 500 [IU] via INTRAVENOUS

## 2018-05-15 MED ORDER — LIDOCAINE HCL (PF) 1 % IJ SOLN
INTRAMUSCULAR | Status: AC
  Filled 2018-05-15: qty 30

## 2018-05-15 SURGICAL SUPPLY — 31 items
BAG DECANTER FOR FLEXI CONT (MISCELLANEOUS) ×3 IMPLANT
CHLORAPREP W/TINT 10.5 ML (MISCELLANEOUS) ×3 IMPLANT
CLOTH BEACON ORANGE TIMEOUT ST (SAFETY) ×3 IMPLANT
COVER LIGHT HANDLE STERIS (MISCELLANEOUS) ×6 IMPLANT
DECANTER SPIKE VIAL GLASS SM (MISCELLANEOUS) ×3 IMPLANT
DERMABOND ADVANCED (GAUZE/BANDAGES/DRESSINGS) ×2
DERMABOND ADVANCED .7 DNX12 (GAUZE/BANDAGES/DRESSINGS) ×1 IMPLANT
DRAPE C-ARM FOLDED MOBILE STRL (DRAPES) ×3 IMPLANT
ELECT REM PT RETURN 9FT ADLT (ELECTROSURGICAL) ×3
ELECTRODE REM PT RTRN 9FT ADLT (ELECTROSURGICAL) ×1 IMPLANT
GLOVE BIOGEL PI IND STRL 6.5 (GLOVE) ×1 IMPLANT
GLOVE BIOGEL PI IND STRL 7.0 (GLOVE) ×1 IMPLANT
GLOVE BIOGEL PI INDICATOR 6.5 (GLOVE) ×2
GLOVE BIOGEL PI INDICATOR 7.0 (GLOVE) ×2
GLOVE SURG SS PI 6.5 STRL IVOR (GLOVE) ×3 IMPLANT
GLOVE SURG SS PI 7.5 STRL IVOR (GLOVE) ×3 IMPLANT
GOWN STRL REUS W/TWL LRG LVL3 (GOWN DISPOSABLE) ×6 IMPLANT
IV NS 500ML (IV SOLUTION) ×2
IV NS 500ML BAXH (IV SOLUTION) ×1 IMPLANT
KIT PORT POWER 8FR ISP MRI (Port) ×3 IMPLANT
KIT TURNOVER KIT A (KITS) ×3 IMPLANT
NEEDLE HYPO 25X1 1.5 SAFETY (NEEDLE) ×3 IMPLANT
PACK MINOR (CUSTOM PROCEDURE TRAY) ×3 IMPLANT
PAD ARMBOARD 7.5X6 YLW CONV (MISCELLANEOUS) ×3 IMPLANT
SET BASIN LINEN APH (SET/KITS/TRAYS/PACK) ×3 IMPLANT
SUT MNCRL AB 4-0 PS2 18 (SUTURE) ×3 IMPLANT
SUT VIC AB 3-0 SH 27 (SUTURE) ×2
SUT VIC AB 3-0 SH 27X BRD (SUTURE) ×1 IMPLANT
SYR 20CC LL (SYRINGE) ×3 IMPLANT
SYR 5ML LL (SYRINGE) ×3 IMPLANT
SYR CONTROL 10ML LL (SYRINGE) ×3 IMPLANT

## 2018-05-15 NOTE — Transfer of Care (Signed)
Immediate Anesthesia Transfer of Care Note  Patient: [name] [name]  Procedure(s) Performed: INSERTION PORT-A-CATH (Right Chest)  Patient Location: PACU  Anesthesia Type:MAC  Level of Consciousness: awake, alert  and patient cooperative  Airway & Oxygen Therapy: Patient Spontanous Breathing  Post-op Assessment: Report given to RN and Post -op Vital signs reviewed and stable  Post vital signs: Reviewed and stable  Last Vitals:  Vitals Value Taken Time  BP    Temp    Pulse    Resp    SpO2      Last Pain:  Vitals:   05/15/18 0813  TempSrc: Oral  PainSc: 0-No pain      Patients Stated Pain Goal: 5 ([id number])  Complications: No apparent anesthesia complications

## 2018-05-15 NOTE — Anesthesia Postprocedure Evaluation (Signed)
Anesthesia Post Note  Patient: [name] [name]  Procedure(s) Performed: INSERTION PORT-A-CATH (Right Chest)  Patient location during evaluation: PACU Anesthesia Type: MAC Level of consciousness: awake and alert and patient cooperative Pain management: satisfactory to patient Vital Signs Assessment: post-procedure vital signs reviewed and stable Respiratory status: spontaneous breathing Cardiovascular status: stable Postop Assessment: no apparent nausea or vomiting Anesthetic complications: no     Last Vitals:  Vitals:   05/15/18 0908 05/15/18 0915  BP: 104/60 110/64  Pulse:  (!) 53  Resp:  14  Temp: 36.5 C   SpO2: 98% 96%    Last Pain:  Vitals:   05/15/18 0908  TempSrc:   PainSc: Asleep                 Hideo Googe

## 2018-05-15 NOTE — Discharge Instructions (Signed)
Implanted Port Insertion, Care After °This sheet gives you information about how to care for yourself after your procedure. Your health care provider may also give you more specific instructions. If you have problems or questions, contact your health care provider. °What can I expect after the procedure? °After your procedure, it is common to have: °· Discomfort at the port insertion site. °· Bruising on the skin over the port. This should improve over 3-4 days. ° °Follow these instructions at home: °Port care °· After your port is placed, you will get a manufacturer's information card. The card has information about your port. Keep this card with you at all times. °· Take care of the port as told by your health care provider. Ask your health care provider if you or a family member can get training for taking care of the port at home. A home health care nurse may also take care of the port. °· Make sure to remember what type of port you have. °Incision care °· Follow instructions from your health care provider about how to take care of your port insertion site. Make sure you: °? Wash your hands with soap and water before you change your bandage (dressing). If soap and water are not available, use hand sanitizer. °? Change your dressing as told by your health care provider. °? Leave stitches (sutures), skin glue, or adhesive strips in place. These skin closures may need to stay in place for 2 weeks or longer. If adhesive strip edges start to loosen and curl up, you may trim the loose edges. Do not remove adhesive strips completely unless your health care provider tells you to do that. °· Check your port insertion site every day for signs of infection. Check for: °? More redness, swelling, or pain. °? More fluid or blood. °? Warmth. °? Pus or a bad smell. °General instructions °· Do not take baths, swim, or use a hot tub until your health care provider approves. °· Do not lift anything that is heavier than 10 lb (4.5  kg) for a week, or as told by your health care provider. °· Ask your health care provider when it is okay to: °? Return to work or school. °? Resume usual physical activities or sports. °· Do not drive for 24 hours if you were given a medicine to help you relax (sedative). °· Take over-the-counter and prescription medicines only as told by your health care provider. °· Wear a medical alert bracelet in case of an emergency. This will tell any health care providers that you have a port. °· Keep all follow-up visits as told by your health care provider. This is important. °Contact a health care provider if: °· You cannot flush your port with saline as directed, or you cannot draw blood from the port. °· You have a fever or chills. °· You have more redness, swelling, or pain around your port insertion site. °· You have more fluid or blood coming from your port insertion site. °· Your port insertion site feels warm to the touch. °· You have pus or a bad smell coming from the port insertion site. °Get help right away if: °· You have chest pain or shortness of breath. °· You have bleeding from your port that you cannot control. °Summary °· Take care of the port as told by your health care provider. °· Change your dressing as told by your health care provider. °· Keep all follow-up visits as told by your health care provider. °  This information is not intended to replace advice given to you by your health care provider. Make sure you discuss any questions you have with your health care provider. °Document Released: 05/30/2013 Document Revised: 06/30/2016 Document Reviewed: 06/30/2016 °Elsevier Interactive Patient Education © 2017 [employer] °Implanted Port Home Guide °An implanted port is a type of central line that is placed under the skin. Central lines are used to provide IV access when treatment or nutrition needs to be given through a person’s veins. Implanted ports are used for long-term IV access. An implanted port  may be placed because: °· You need IV medicine that would be irritating to the small veins in your hands or arms. °· You need long-term IV medicines, such as antibiotics. °· You need IV nutrition for a long period. °· You need frequent blood draws for lab tests. °· You need dialysis. ° °Implanted ports are usually placed in the chest area, but they can also be placed in the upper arm, the abdomen, or the leg. An implanted port has two main parts: °· Reservoir. The reservoir is round and will appear as a small, raised area under your skin. The reservoir is the part where a needle is inserted to give medicines or draw blood. °· Catheter. The catheter is a thin, flexible tube that extends from the reservoir. The catheter is placed into a large vein. Medicine that is inserted into the reservoir goes into the catheter and then into the vein. ° °How will I care for my incision site? °Do not get the incision site wet. Bathe or shower as directed by your health care provider. °How is my port accessed? °Special steps must be taken to access the port: °· Before the port is accessed, a numbing cream can be placed on the skin. This helps numb the skin over the port site. °· Your health care provider uses a sterile technique to access the port. °? Your health care provider must put on a mask and sterile gloves. °? The skin over your port is cleaned carefully with an antiseptic and allowed to dry. °? The port is gently pinched between sterile gloves, and a needle is inserted into the port. °· Only "non-coring" port needles should be used to access the port. Once the port is accessed, a blood return should be checked. This helps ensure that the port is in the vein and is not clogged. °· If your port needs to remain accessed for a constant infusion, a clear (transparent) bandage will be placed over the needle site. The bandage and needle will need to be changed every week, or as directed by your health care provider. °· Keep the  bandage covering the needle clean and dry. Do not get it wet. Follow your health care provider’s instructions on how to take a shower or bath while the port is accessed. °· If your port does not need to stay accessed, no bandage is needed over the port. ° °What is flushing? °Flushing helps keep the port from getting clogged. Follow your health care provider’s instructions on how and when to flush the port. Ports are usually flushed with saline solution or a medicine called heparin. The need for flushing will depend on how the port is used. °· If the port is used for intermittent medicines or blood draws, the port will need to be flushed: °? After medicines have been given. °? After blood has been drawn. °? As part of routine maintenance. °· If a constant infusion is   running, the port may not need to be flushed. ° °How long will my port stay implanted? °The port can stay in for as long as your health care provider thinks it is needed. When it is time for the port to come out, surgery will be done to remove it. The procedure is similar to the one performed when the port was put in. °When should I seek immediate medical care? °When you have an implanted port, you should seek immediate medical care if: °· You notice a bad smell coming from the incision site. °· You have swelling, redness, or drainage at the incision site. °· You have more swelling or pain at the port site or the surrounding area. °· You have a fever that is not controlled with medicine. ° °This information is not intended to replace advice given to you by your health care provider. Make sure you discuss any questions you have with your health care provider. °Document Released: 08/09/2005 Document Revised: 01/15/2016 Document Reviewed: 04/16/2013 °Elsevier Interactive Patient Education © 2017 [employer] ° ° °PATIENT INSTRUCTIONS °POST-ANESTHESIA ° °IMMEDIATELY FOLLOWING SURGERY:  Do not drive or operate machinery for the first twenty four hours after  surgery.  Do not make any important decisions for twenty four hours after surgery or while taking narcotic pain medications or sedatives.  If you develop intractable nausea and vomiting or a severe headache please notify your doctor immediately. ° °FOLLOW-UP:  Please make an appointment with your surgeon as instructed. You do not need to follow up with anesthesia unless specifically instructed to do so. ° °WOUND CARE INSTRUCTIONS (if applicable):  Keep a dry clean dressing on the anesthesia/puncture wound site if there is drainage.  Once the wound has quit draining you may leave it open to air.  Generally you should leave the bandage intact for twenty four hours unless there is drainage.  If the epidural site drains for more than 36-48 hours please call the anesthesia department. ° °QUESTIONS?:  Please feel free to call your physician or the hospital operator if you have any questions, and they will be happy to assist you.    ° ° ° °

## 2018-05-15 NOTE — Anesthesia Preprocedure Evaluation (Signed)
Anesthesia Evaluation  Patient identified by MRN, date of birth, ID band Patient awake    Reviewed: Allergy & Precautions, H&P , NPO status , Patient's Chart, lab work & pertinent test results, reviewed documented beta blocker date and time   Airway Mallampati: III  TM Distance: >3 FB Neck ROM: full    Dental no notable dental hx. (+) Caps, Poor Dentition, Chipped, Missing, Dental Advidsory Given   Pulmonary neg pulmonary ROS,    Pulmonary exam normal breath sounds clear to auscultation       Cardiovascular Exercise Tolerance: Good negative cardio ROS   Rhythm:regular Rate:Normal     Neuro/Psych PSYCHIATRIC DISORDERS Depression Schizophrenia Dementia negative neurological ROS  negative psych ROS   GI/Hepatic negative GI ROS, Neg liver ROS,   Endo/Other  negative endocrine ROS  Renal/GU negative Renal ROS  negative genitourinary   Musculoskeletal   Abdominal   Peds  Hematology negative hematology ROS (+)   Anesthesia Other Findings Breast CA Cognitive impairment secondary to "dementia" Nutritional deficiency Generalized muscle weakness Psychotic disorder with delusions Sinus [name] [name], 57 Recently lost a lower front cap  Reproductive/Obstetrics negative OB ROS                             Anesthesia Physical Anesthesia Plan  ASA: III  Anesthesia Plan: MAC   Post-op Pain Management:    Induction:   PONV Risk Score and Plan:   Airway Management Planned:   Additional Equipment:   Intra-op Plan:   Post-operative Plan:   Informed Consent: I have reviewed the patients History and Physical, chart, labs and discussed the procedure including the risks, benefits and alternatives for the proposed anesthesia with the patient or authorized representative who has indicated his/her understanding and acceptance.   Dental Advisory Given  Plan Discussed with: CRNA and  Anesthesiologist  Anesthesia Plan Comments:         Anesthesia Quick Evaluation

## 2018-05-15 NOTE — Anesthesia Procedure Notes (Signed)
Procedure Name: MAC Date/Time: 05/15/2018 8:34 AM Performed by: Vista Deck, CRNA Pre-anesthesia Checklist: Patient identified, Emergency Drugs available, Suction available, Timeout performed and Patient being monitored Patient Re-evaluated:Patient Re-evaluated prior to induction Oxygen Delivery Method: Nasal Cannula

## 2018-05-15 NOTE — Interval H&P Note (Signed)
History and Physical Interval Note:  05/15/2018 8:21 AM  [name] [name]  has presented today for surgery, with the diagnosis of left breast cancer  The various methods of treatment have been discussed with the patient and family. After consideration of risks, benefits and other options for treatment, the patient has consented to  Procedure(s): INSERTION PORT-A-CATH as a surgical intervention .  The patient's history has been reviewed, patient examined, no change in status, stable for surgery.  I have reviewed the patient's chart and labs.  Questions were answered to the patient's satisfaction.     Aviva Signs

## 2018-05-15 NOTE — Op Note (Signed)
Patient:  [name] [name]  DOB:  02-May-1956  MRN:  [id number]   Preop Diagnosis: Left breast carcinoma  Postop Diagnosis: Same  Procedure: Port-A-Cath insertion  Surgeon: Aviva Signs, MD  Anes: MAC  Indications: Patient is a 62 year old white female who presents for Port-A-Cath insertion.  She is about to undergo chemotherapy for left breast carcinoma.  The risks and benefits of the procedure including bleeding, infection, and pneumothorax were fully explained to the patient, who gave informed consent.  Procedure note: The patient was placed in the Trendelenburg position after the right upper chest was prepped and draped using usual sterile technique with DuraPrep.  Surgical site confirmation was performed.  1% Xylocaine was used for local anesthesia.  Incision was made below the right clavicle.  Subcutaneous pocket was formed.  A needle was advanced into the right subclavian vein using the Seldinger technique without difficulty.  A guidewire was then advanced into the right atrium under fluoroscopic guidance.  An introducer and peel-away sheath were placed over the guidewire.  The catheter was inserted through the peel-away sheath and the peel-away sheath was removed.  The catheter was then attached to the port and the port placed in subcutaneous pocket.  Adequate positioning was confirmed by fluoroscopy.  Good backflow blood was noted on aspiration of the port.  The port was flushed with heparin flush.  Subcutaneous layer was reapproximated using a 3-0 Vicryl interrupted suture.  The skin was closed using a 4-0 Monocryl subcuticular suture.  Dermabond was applied.  All tape and needle counts were correct at the end of the procedure.  Patient was transferred to the PACU in stable condition.  A chest x-ray will be performed at that time.  Complications: None  EBL: Minimal  Specimen: None

## 2018-05-16 ENCOUNTER — Inpatient Hospital Stay (HOSPITAL_BASED_OUTPATIENT_CLINIC_OR_DEPARTMENT_OTHER): Payer: Medicaid Other | Admitting: Internal Medicine

## 2018-05-16 ENCOUNTER — Other Ambulatory Visit: Payer: Self-pay

## 2018-05-16 ENCOUNTER — Encounter (HOSPITAL_COMMUNITY): Payer: Self-pay | Admitting: Internal Medicine

## 2018-05-16 VITALS — BP 116/54 | HR 69 | Temp 98.4°F | Resp 16 | Wt 157.2 lb

## 2018-05-16 DIAGNOSIS — C50812 Malignant neoplasm of overlapping sites of left female breast: Secondary | ICD-10-CM | POA: Diagnosis not present

## 2018-05-16 DIAGNOSIS — F0390 Unspecified dementia without behavioral disturbance: Secondary | ICD-10-CM

## 2018-05-16 DIAGNOSIS — Z171 Estrogen receptor negative status [ER-]: Secondary | ICD-10-CM | POA: Diagnosis not present

## 2018-05-16 DIAGNOSIS — Z87891 Personal history of nicotine dependence: Secondary | ICD-10-CM

## 2018-05-16 DIAGNOSIS — R911 Solitary pulmonary nodule: Secondary | ICD-10-CM | POA: Diagnosis not present

## 2018-05-16 NOTE — Progress Notes (Signed)
Diagnosis Malignant neoplasm of overlapping sites of left breast in female, estrogen receptor negative ([place]) - Plan: CT Biopsy  Staging Cancer Staging No matching staging information was found for the patient.  Assessment and Plan:  1.  Stage II left breast cancer.  62 yr old female who resides in [facility] and has been in nursing facility since 04/2017.  She reports she felt an area in left breast and underwent evaluation with bilateral diagnostic mammogram on 04/04/2018 that showed  IMPRESSION: 1. Highly suspicious left breast mass with associated calcifications measuring 4.6 cm sonographically.  2. Suspicious lymph node in the left axilla with focal nodular cortical thickening.  3.  No findings of malignancy in the right breast.  She had a biopsy of the left breast lesion 2:00 position and left axillary lymph node on 04/11/2018.  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  FISH +  Pt is here to go over PET scan that was done 05/11/2018 that was reviewed and showed  IMPRESSION: 1. Hypermetabolic LEFT breast mass with hypermetabolic LEFT axillary nodal metastasis. 2. Bilateral hypermetabolic pulmonary nodules consistent pulmonary metastasis. 3. No mediastinal nodal metastasis. 4. No metastatic disease outside of the thorax.  I have discussed with the patient, nursing home worker, and her sister who has power of attorney via phone that Pet findings are concerning for lung involvement.  Pt has reported history of smoking 2 ppd for 20 years but quit smoking 25 years ago.    I have discussed with them if lung lesion is consistent with breast primary, she will still be recommended for Her 2 directed therapy.  She will RTC to go over results of lung biopsy.  ECHO done 05/03/2018 reviewed and showed EF of 60-65%.  She will not be a candidate for endocrine therapy as her tumor is hormone receptor negative.     Baseline labs done 04/28/2018 reviewed and were WNL with normal breast tumor markers.  All questions answered and pt and family expressed understanding of the information presented.    2.  Lung lesion.   Pet findings are concerning for lung involvement.  Pt has reported history of smoking 2 ppd for 20 years but quit smoking 25 years ago.  I have discussed with them if lung lesion is consistent with breast primary, she will still be recommended for Her 2 directed therapy.  She will RTC to go over results of lung biopsy.   3.  Dementia.  Sister reports this affected several family members.  Pt is alert and conversant.  She is currently in [facility].    4.  Health maintenance.  Pt was not getting mammograms since being in facility.  She is not sure when last colonoscopy was done.    30 minutes spent with more than 50% spent in counseling and coordination of care.    Interval history:  Historical data obtained from note dated 04/28/2018.   62 year old female referred for evaluation due to breast cancer.  She is accompanied by nursing home worker.  Patient reportedly has been in nursing facility since September 2018.  She reportedly was diagnosed with dementia which reportedly runs in her family.  Power of attorney is available via phone.  The patient reportedly noted an area in the left breast and brought that to the facility's attention.       She subsequently had bilateral diagnostic mammogram done on 04/04/2018 that showed  IMPRESSION: 1. Highly suspicious left breast mass with associated calcifications measuring 4.6 cm sonographically.  2. Suspicious lymph node in the left axilla with focal nodular cortical thickening.  3.  No findings of malignancy in the right breast.  RECOMMENDATION: 1. Recommend ultrasound-guided biopsy of the palpable mass in the left breast at the 1 to 2 o'clock position.  2. Recommend ultrasound-guided biopsy of the abnormal lymph node in the left axilla.      Patient underwent ultrasound-guided biopsy of the left breast on 04/11/2018.  Clip was placed. Pathology returned as : ADDITIONAL INFORMATION: 1. Immunohistochemical stain for E-cadherin is positive, consistent with a ductal phenotype. Jaquita Folds MD Pathologist, Electronic Signature ( Signed 04/14/2018) 1. PROGNOSTIC INDICATORS Results: IMMUNOHISTOCHEMICAL AND MORPHOMETRIC ANALYSIS PERFORMED MANUALLY The tumor cells are Positive for Her2 (3+). Estrogen Receptor: 0%, NEGATIVE Progesterone Receptor: 0%, NEGATIVE Proliferation Marker Ki67: 50% REFERENCE RANGE ESTROGEN RECEPTOR NEGATIVE 0% POSITIVE =>1% REFERENCE RANGE PROGESTERONE RECEPTOR NEGATIVE 0% POSITIVE =>1% All controls stained appropriately Vicente Males MD Pathologist, Electronic Signature ( Signed 04/14/2018) FINAL DIAGNOSIS 1 of 3 FINAL for [place], Keirsten ([id number]) Diagnosis 1. Breast, left, needle core biopsy, 2:00 - INVASIVE MAMMARY CARCINOMA. SEE NOTE. 2. Lymph node, needle/core biopsy, left axilla - INVOLVED BY MAMMARY CARCINOMA   Current Status:  Pt is seen today for follow-up.  She is here to go over PET scan and ECHO.  She has undergone port placement.     Problem List Patient Active Problem List   Diagnosis Date Noted  . Malignant neoplasm of overlapping sites of left female breast ([place]) [C50.812]     Past Medical History Past Medical History:  Diagnosis Date  . Alzheimer's dementia   . Bradycardia   . Breast cancer ([place])   . Dementia   . Depression   . Hypokalemia   . Hypotension   . 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 ABDOMINAL HYSTERECTOMY    . BREAST BIOPSY Left     Family History History reviewed. No pertinent family history.   Social History  has an unknown smoking status. She has never used smokeless tobacco. She reports that she drank alcohol. She reports  that she does not use drugs.  Medications  Current Outpatient Medications:  .  ALPRAZolam (XANAX) 0.25 MG tablet, Take 0.25 mg by mouth every 6 (six) hours as needed for anxiety., Disp: , Rfl:  .  cholecalciferol (VITAMIN D) 1000 units tablet, Take 1,000 Units by mouth daily., Disp: , Rfl:  .  divalproex (DEPAKOTE) 250 MG DR tablet, Take 250 mg by mouth at bedtime., Disp: , Rfl:  .  divalproex (DEPAKOTE) 500 MG DR tablet, Take 500 mg by mouth daily. , Disp: , Rfl:  .  donepezil (ARICEPT) 10 MG tablet, Take 10 mg by mouth at bedtime., Disp: , Rfl:  .  memantine (NAMENDA) 10 MG tablet, Take 10 mg by mouth 2 (two) times daily., Disp: , Rfl:  .  QUEtiapine (SEROQUEL) 25 MG tablet, Take 12.5 mg by mouth 3 (three) times daily., Disp: , Rfl:  .  sertraline (ZOLOFT) 100 MG tablet, Take 100 mg by mouth daily., Disp: , Rfl:   Allergies Patient has no known allergies.  Review of Systems Review of Systems - Oncology ROS negative   Physical Exam  Vitals Wt Readings from Last 3 Encounters:  05/16/18 157 lb 3.2 oz (71.3 kg)  05/15/18 158 lb (71.7 kg)  05/09/18 158 lb (71.7 kg)   Temp Readings from Last 3 Encounters:  05/16/18 98.4 F (36.9 C) (Oral)  05/15/18 98 F (36.7 C) (Oral)  05/09/18 97.8 F (36.6 C) (Temporal)   BP Readings from Last 3 Encounters:  05/16/18 (!) 116/54  05/15/18 140/60  05/09/18 131/66   Pulse Readings from Last 3 Encounters:  05/16/18 69  05/15/18 (!) 57  05/09/18 65    Constitutional: Well-developed, well-nourished, and in no distress.   HENT: Head: Normocephalic and atraumatic.  Mouth/Throat: No oropharyngeal exudate. Mucosa moist. Eyes: Pupils are equal, round, and reactive to light. Conjunctivae are normal. No scleral icterus.  Neck: Normal range of motion. Neck supple. No JVD present.  Cardiovascular: Normal rate, regular rhythm and normal heart sounds.  Exam reveals no gallop and no friction rub.   No murmur heard. Pulmonary/Chest: Effort normal  and breath sounds normal. No respiratory distress. No wheezes.No rales.  Abdominal: Soft. Bowel sounds are normal. No distension. There is no tenderness. There is no guarding.  Musculoskeletal: No edema or tenderness.  Lymphadenopathy: No cervical, axillary or supraclavicular adenopathy.  Neurological: Alert and oriented to person, place, and time. No cranial nerve deficit.  Skin: Skin is warm and dry. No rash noted. No erythema. No pallor.  Psychiatric: Affect and judgment normal.  Limited breast exam:  Chaperone present.  Left breast mass unchanged.    Labs No visits with results within 3 Day(s) from this visit.  Latest known visit with results is:  Hospital Outpatient Visit on 05/11/2018  Component Date Value Ref Range Status  . Glucose-Capillary 05/11/2018 81  70 - 99 mg/dL Final     Pathology Orders Placed This Encounter  Procedures  . CT Biopsy    Standing Status:   Future    Standing Expiration Date:   05/16/2019    Order Specific Question:   Lab orders requested (DO NOT place separate lab orders, these will be automatically ordered during procedure specimen collection):    Answer:   Surgical Pathology    Order Specific Question:   Reason for Exam (SYMPTOM  OR DIAGNOSIS REQUIRED)    Answer:   breast cancer, lung lesions noted on PET.  History of smoking    Order Specific Question:   Preferred imaging location?    Answer:   [facility]    Order Specific Question:   Radiology Contrast Protocol - do NOT remove file path    Answer:   [email or web address]       Zoila Shutter MD

## 2018-05-16 NOTE — Patient Instructions (Signed)
[place] at [facility] Discharge Instructions    You saw Dr. Walden Field today. We will set you up with a lung biopsy. You will return after biopsy to discuss results and plan.     Thank you for choosing [place] at [facility] to provide your oncology and hematology care.  To afford each patient quality time with our provider, please arrive at least 15 minutes before your scheduled appointment time.   If you have a lab appointment with the [facility] please come in thru the  Main Entrance and check in at the main information desk  You need to re-schedule your appointment should you arrive 10 or more minutes late.  We strive to give you quality time with our providers, and arriving late affects you and other patients whose appointments are after yours.  Also, if you no show three or more times for appointments you may be dismissed from the clinic at the providers discretion.     Again, thank you for choosing [facility].  Our hope is that these requests will decrease the amount of time that you wait before being seen by our physicians.       _____________________________________________________________  Should you have questions after your visit to [facility], please contact our office at [phone] between the hours of 8:00 a.m. and 4:30 p.m.  Voicemails left after 4:00 p.m. will not be returned until the following business day.  For prescription refill requests, have your pharmacy contact our office and allow 72 hours.    Cancer Center Support Programs:   > Cancer Support Group  2nd Tuesday of the month 1pm-2pm, Journey Room

## 2018-05-17 ENCOUNTER — Encounter (HOSPITAL_COMMUNITY): Payer: Self-pay | Admitting: General Surgery

## 2018-05-19 ENCOUNTER — Other Ambulatory Visit (HOSPITAL_COMMUNITY): Payer: Self-pay | Admitting: Internal Medicine

## 2018-05-22 ENCOUNTER — Other Ambulatory Visit: Payer: Self-pay | Admitting: Radiology

## 2018-05-23 ENCOUNTER — Ambulatory Visit (HOSPITAL_COMMUNITY)
Admission: RE | Admit: 2018-05-23 | Discharge: 2018-05-23 | Disposition: A | Discharge status: Home / Self Care | Payer: Medicaid Other | Source: Ambulatory Visit | Attending: Internal Medicine | Admitting: Internal Medicine

## 2018-05-23 ENCOUNTER — Encounter (HOSPITAL_COMMUNITY): Payer: Self-pay

## 2018-05-23 ENCOUNTER — Ambulatory Visit (HOSPITAL_COMMUNITY)
Admission: RE | Admit: 2018-05-23 | Discharge: 2018-05-23 | Disposition: A | Discharge status: Home / Self Care | Payer: Medicaid Other | Source: Ambulatory Visit | Attending: Interventional Radiology | Admitting: Interventional Radiology

## 2018-05-23 DIAGNOSIS — G309 Alzheimer's disease, unspecified: Secondary | ICD-10-CM | POA: Diagnosis not present

## 2018-05-23 DIAGNOSIS — F329 Major depressive disorder, single episode, unspecified: Secondary | ICD-10-CM | POA: Diagnosis not present

## 2018-05-23 DIAGNOSIS — Z79899 Other long term (current) drug therapy: Secondary | ICD-10-CM | POA: Diagnosis not present

## 2018-05-23 DIAGNOSIS — C50812 Malignant neoplasm of overlapping sites of left female breast: Secondary | ICD-10-CM | POA: Insufficient documentation

## 2018-05-23 DIAGNOSIS — Z171 Estrogen receptor negative status [ER-]: Secondary | ICD-10-CM | POA: Diagnosis present

## 2018-05-23 DIAGNOSIS — Z9889 Other specified postprocedural states: Secondary | ICD-10-CM

## 2018-05-23 DIAGNOSIS — F0280 Dementia in other diseases classified elsewhere without behavioral disturbance: Secondary | ICD-10-CM | POA: Diagnosis not present

## 2018-05-23 DIAGNOSIS — C7802 Secondary malignant neoplasm of left lung: Secondary | ICD-10-CM | POA: Diagnosis not present

## 2018-05-23 DIAGNOSIS — R918 Other nonspecific abnormal finding of lung field: Secondary | ICD-10-CM | POA: Diagnosis present

## 2018-05-23 LAB — CBC
HEMATOCRIT: 40.5 % (ref 36.0–46.0)
Hemoglobin: 12.8 g/dL (ref 12.0–15.0)
MCH: 29.4 pg (ref 26.0–34.0)
MCHC: 31.6 g/dL (ref 30.0–36.0)
MCV: 92.9 fL (ref 78.0–100.0)
PLATELETS: 253 10*3/uL (ref 150–400)
RBC: 4.36 MIL/uL (ref 3.87–5.11)
RDW: 12.6 % (ref 11.5–15.5)
WBC: 6 10*3/uL (ref 4.0–10.5)

## 2018-05-23 LAB — PROTIME-INR
INR: 0.99
Prothrombin Time: 13 seconds (ref 11.4–15.2)

## 2018-05-23 MED ORDER — FENTANYL CITRATE (PF) 100 MCG/2ML IJ SOLN
INTRAMUSCULAR | Status: AC | PRN
  Administered 2018-05-23 (×2): 25 ug via INTRAVENOUS

## 2018-05-23 MED ORDER — MIDAZOLAM HCL 2 MG/2ML IJ SOLN
INTRAMUSCULAR | Status: AC
  Filled 2018-05-23: qty 4

## 2018-05-23 MED ORDER — MIDAZOLAM HCL 2 MG/2ML IJ SOLN
INTRAMUSCULAR | Status: AC | PRN
  Administered 2018-05-23 (×2): 0.5 mg via INTRAVENOUS

## 2018-05-23 MED ORDER — FENTANYL CITRATE (PF) 100 MCG/2ML IJ SOLN
INTRAMUSCULAR | Status: AC
  Filled 2018-05-23: qty 4

## 2018-05-23 MED ORDER — SODIUM CHLORIDE 0.9 % IV SOLN: INTRAVENOUS | Status: DC

## 2018-05-23 MED ORDER — LIDOCAINE HCL 1 % IJ SOLN
INTRAMUSCULAR | Status: AC
  Filled 2018-05-23: qty 20

## 2018-05-23 NOTE — Sedation Documentation (Signed)
Patient is resting comfortably. 

## 2018-05-23 NOTE — Discharge Instructions (Signed)
Needle Biopsy, Care After °These instructions give you information about caring for yourself after your procedure. Your doctor may also give you more specific instructions. Call your doctor if you have any problems or questions after your procedure. °Follow these instructions at home: °· Rest as told by your doctor. °· Take medicines only as told by your doctor. °· There are many different ways to close and cover the biopsy site, including stitches (sutures), skin glue, and adhesive strips. Follow instructions from your doctor about: °? How to take care of your biopsy site. °? When and how you should change your bandage (dressing). °? When you should remove your dressing. °? Removing whatever was used to close your biopsy site. °· Check your biopsy site every day for signs of infection. Watch for: °? Redness, swelling, or pain. °? Fluid, blood, or pus. °Contact a doctor if: °· You have a fever. °· You have redness, swelling, or pain at the biopsy site, and it lasts longer than a few days. °· You have fluid, blood, or pus coming from the biopsy site. °· You feel sick to your stomach (nauseous). °· You throw up (vomit). °Get help right away if: °· You are short of breath. °· You have trouble breathing. °· Your chest hurts. °· You feel dizzy or you pass out (faint). °· You have bleeding that does not stop with pressure or a bandage. °· You cough up blood. °· Your belly (abdomen) hurts. °This information is not intended to replace advice given to you by your health care provider. Make sure you discuss any questions you have with your health care provider. °Document Released: 07/22/2008 Document Revised: 01/15/2016 Document Reviewed: 08/05/2014 °Elsevier Interactive Patient Education © 2018 Elsevier Inc. ° °

## 2018-05-23 NOTE — Sedation Documentation (Signed)
dsg R upper chest intact

## 2018-05-23 NOTE — H&P (Addendum)
Chief Complaint: Patient was seen in consultation today for lung nodule biopsy at the request of Higgs,Vetta  Referring Physician(s): Higgs,Vetta  Supervising Physician: Sandi Mariscal  Patient Status: [name] [name] - Out-pt  History of Present Illness: [name] [name] is a 62 y.o. female   Newly diagnosed Breast Ca FINAL for ALLECIA, [name] [phone]) Diagnosis 1. Breast, left, needle core biopsy, 2:00 - INVASIVE MAMMARY CARCINOMA. SEE NOTE. 2. Lymph node, needle/core biopsy, left axilla - INVOLVED BY MAMMARY CARCINOMA  PET scan 05/11/18 IMPRESSION: 1. Hypermetabolic LEFT breast mass with hypermetabolic LEFT axillary nodal metastasis. 2. Bilateral hypermetabolic pulmonary nodules consistent pulmonary metastasis. 3. No mediastinal nodal metastasis. 4. No metastatic disease outside of the thorax.  Dr Walden Field note 05/11/18: Lung lesion.   Pet findings are concerning for lung involvement.  Pt has reported history of smoking 2 ppd for 20 years but quit smoking 25 years ago.  I have discussed with them if lung lesion is consistent with breast primary, she will still be recommended for Her 2 directed therapy.  She will RTC to go over results of lung biopsy.   Scheduled now for lung nodule biopsy  Past Medical History:  Diagnosis Date  . Alzheimer's dementia ([place])   . Bradycardia   . Breast cancer ([name] [name])   . Dementia ([name] [name])   . Depression   . Hypokalemia   . Hypotension   . 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 ABDOMINAL HYSTERECTOMY    . BREAST BIOPSY Left   . PORTACATH PLACEMENT Right 05/15/2018   Procedure: INSERTION PORT-A-CATH;  Surgeon: Aviva Signs, MD;  Location: [facility];  Service: General;  Laterality: Right;    Allergies: Patient has no known allergies.  Medications: Prior to Admission medications   Medication Sig Start Date End Date Taking? Authorizing Provider  cholecalciferol  (VITAMIN D) 1000 units tablet Take 1,000 Units by mouth daily.   Yes [provider]  divalproex (DEPAKOTE) 250 MG DR tablet Take 250 mg by mouth at bedtime.   Yes [provider]  divalproex (DEPAKOTE) 500 MG DR tablet Take 500 mg by mouth daily.    Yes [provider]  donepezil (ARICEPT) 10 MG tablet Take 10 mg by mouth at bedtime.   Yes [provider]  memantine (NAMENDA) 10 MG tablet Take 10 mg by mouth 2 (two) times daily.   Yes [provider]  QUEtiapine (SEROQUEL) 25 MG tablet Take 12.5 mg by mouth 3 (three) times daily.   Yes [provider]  sertraline (ZOLOFT) 100 MG tablet Take 100 mg by mouth daily.   Yes [provider]  ALPRAZolam (XANAX) 0.25 MG tablet Take 0.25 mg by mouth every 6 (six) hours as needed for anxiety.    [provider]     History reviewed. No pertinent family history.  Social History   Socioeconomic History  . Marital status: Widowed    Spouse name: Not on file  . Number of children: Not on file  . Years of education: Not on file  . Highest education level: Not on file  Occupational History  . Not on file  Social Needs  . Financial resource strain: Not on file  . Food insecurity:    Worry: Not on file    Inability: Not on file  . Transportation needs:    Medical: Not on file    Non-medical: Not on file  Tobacco Use  .  Smoking status: Unknown If Ever Smoked  . Smokeless tobacco: Never Used  Substance and Sexual Activity  . Alcohol use: Not Currently  . Drug use: Never  . Sexual activity: Not Currently  Lifestyle  . Physical activity:    Days per week: Not on file    Minutes per session: Not on file  . Stress: Not on file  Relationships  . Social connections:    Talks on phone: Not on file    Gets together: Not on file    Attends religious service: Not on file    Active member of club or organization: Not on file    Attends meetings of clubs or organizations: Not on  file    Relationship status: Not on file  Other Topics Concern  . Not on file  Social History Narrative   Lives at [facility] in [place]: A 12 point ROS discussed and pertinent positives are indicated in the HPI above.  All other systems are negative.  Review of Systems  Constitutional: Negative for activity change, fatigue, fever and unexpected weight change.  Respiratory: Negative for cough and shortness of breath.   Cardiovascular: Negative for chest pain.  Gastrointestinal: Negative for abdominal pain.  Neurological: Negative for weakness.  Psychiatric/Behavioral: Positive for confusion and decreased concentration.    Vital Signs: BP 114/60   Pulse (!) 58   Temp 98 F (36.7 C)   Resp 20   Ht 5\' 2"  (1.575 m)   Wt 170 lb (77.1 kg)   SpO2 97%   BMI 31.09 kg/m   Physical Exam  Cardiovascular: Normal rate, regular rhythm and normal heart sounds.  Pulmonary/Chest: Effort normal and breath sounds normal. She has no wheezes.  Abdominal: Soft. Bowel sounds are normal.  Musculoskeletal: Normal range of motion.  Neurological: She is alert.  Skin: Skin is warm and dry.  Psychiatric: She has a normal mood and affect.  Consented with sister [name] [name] via phone Pt also has good understanding of procedure  Vitals reviewed.   Imaging: Nm Pet Image Initial (pi) Skull Base To Thigh  Result Date: 05/11/2018 CLINICAL DATA:  Initial treatment strategy for tumor type. EXAM: NUCLEAR MEDICINE PET SKULL BASE TO THIGH TECHNIQUE: 7.8 mCi F-18 FDG was injected intravenously. Full-ring PET imaging was performed from the skull base to thigh after the radiotracer. CT data was obtained and used for attenuation correction and anatomic localization. Fasting blood glucose: 81 mg/dl COMPARISON:  None. FINDINGS: Mediastinal blood pool activity: SUV max 3.27 NECK: No hypermetabolic lymph nodes in the neck. Incidental CT findings: none CHEST: Large hypermetabolic mass in  the LEFT breast measuring 4 cm with SUV max equal 22.7. Hypermetabolic irregular LEFT axial lymph node measuring 2.0 cm SUV max equal 17.5. Bilateral hypermetabolic pulmonary nodules. Large example nodule in the LEFT upper lobe measures 2.3 cm SUV max equal 22.3. Small nodule in the RIGHT middle lobe measures 1.0 cm SUV max equal 12.7. Additional large hypermetabolic nodule in the RIGHT lower lobe measuring 2.9 cm. Approximately 6 nodules per lung. Incidental CT findings: none ABDOMEN/PELVIS: No abnormal hypermetabolic activity within the liver, pancreas, adrenal glands, or spleen. No hypermetabolic lymph nodes in the abdomen or pelvis. Incidental CT findings: Post hysterectomy. SKELETON: No focal hypermetabolic activity to suggest skeletal metastasis. Incidental CT findings: none IMPRESSION: 1. Hypermetabolic LEFT breast mass with hypermetabolic LEFT axillary nodal metastasis. 2. Bilateral hypermetabolic pulmonary nodules consistent pulmonary metastasis. 3. No mediastinal nodal metastasis. 4. No metastatic disease  outside of the thorax. Electronically Signed   By: Suzy Bouchard M.D.   On: 05/11/2018 13:53   Dg Chest Port 1 View  Result Date: 05/15/2018 CLINICAL DATA:  Port-A-Cath placement EXAM: PORTABLE CHEST 1 VIEW COMPARISON:  PET-CT May 11, 2018 FINDINGS: Port-A-Cath tip is in the superior vena cava. No pneumothorax. There is a mass in the right base region laterally measuring 3.1 x 2.7 cm. There is a mass in the mid left lung measuring 3.1 x 2.8 cm. Lungs elsewhere clear. Heart size normal. No adenopathy evident. No bone lesions. IMPRESSION: Port-A-Cath tip in superior vena cava. No pneumothorax. Parenchymal lung masses bilaterally, similar to recent CT. No consolidation. Stable cardiac silhouette. Electronically Signed   By: Lowella Grip III M.D.   On: 05/15/2018 09:26   Dg C-arm 1-60 Min-no Report  Result Date: 05/15/2018 Fluoroscopy was utilized by the requesting physician.  No  radiographic interpretation.    Labs:  CBC: Recent Labs    04/28/18 1521  WBC 7.7  HGB 13.1  HCT 40.9  PLT 267    COAGS: No results for input(s): INR, APTT in the last 8760 hours.  BMP: Recent Labs    04/28/18 1521  NA 140  K 4.0  CL 103  CO2 28  GLUCOSE 117*  BUN 12  CALCIUM 9.2  CREATININE 0.64  GFRNONAA >60  GFRAA >60    LIVER FUNCTION TESTS: Recent Labs    04/28/18 1521  BILITOT 0.6  AST 16  ALT 14  ALKPHOS 80  PROT 7.5  ALBUMIN 3.9    TUMOR MARKERS: No results for input(s): AFPTM, CEA, CA199, CHROMGRNA in the last 8760 hours.  Assessment and Plan:  New Breast Ca + PET lung nodules Scheduled now for lung nodule biopsy per Dr Walden Field Risks and benefits discussed with the patient and sister Neoma Laming via phoneincluding, but not limited to bleeding, hemoptysis, respiratory failure requiring intubation, infection, pneumothorax requiring chest tube placement, stroke from air embolism or even death. All of the patient and sisters questions were answered, Neoma Laming is agreeable to proceed. Consent signed via phone and in chart.   Thank you for this interesting consult.  I greatly enjoyed meeting Lorece Keach and look forward to participating in their care.  A copy of this report was sent to the requesting provider on this date.  Electronically Signed: Lavonia Drafts, PA-C 05/23/2018, 9:47 AM   I spent a total of  30 Minutes   in face to face in clinical consultation, greater than 50% of which was counseling/coordinating care for lung nodule biopsy

## 2018-05-23 NOTE — Procedures (Signed)
Pre procedural Dx: Left upper lobe pulmonary mass  Post procedural Dx: Same  Technically successful CT guided biopsy of indeterminate mass within the left upper lobe.   EBL: None.   Complications: None immediate.   Ronny Bacon, MD Pager #: [phone]

## 2018-05-29 NOTE — Progress Notes (Signed)
[name] [name], [place] [id number]   CLINIC:  Medical Oncology/Hematology  PCP:  Renata Caprice, DO [address] [place] [address] [phone]   REASON FOR VISIT: Follow-up for breast cancer, ER-/PR-/HER2+  CURRENT THERAPY: work-up   INTERVAL HISTORY:  [name] [name] 62 y.o. female returns for routine follow-up breast cancer. Patient is here with a care giver from the [facility]. [name] [name] has been living there for almost a year now. [name] [name] has dementia however [name] [name] is full functioning and able to answer and respond appropriately to questions and understand the discussion. [name] [name] has no complaints at this time. Her appetite and energy level are 100% and [name] [name] has no problem maintaining her weight. [name] [name] denies any pain. Denies any nausea, vomiting, or diarrhea. Denies any numbness or tingling to her hands or feet. [name] [name] is ready to start treatment.      REVIEW OF SYSTEMS:  Review of Systems  All other systems reviewed and are negative.    PAST MEDICAL/SURGICAL HISTORY:  Past Medical History:  Diagnosis Date  . Alzheimer's dementia ([place])   . Bradycardia   . Breast cancer ([place])   . Dementia ([place])   . Depression   . Hypokalemia   . Hypotension   . 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 ABDOMINAL HYSTERECTOMY    . BREAST BIOPSY Left   . PORTACATH PLACEMENT Right 05/15/2018   Procedure: INSERTION PORT-A-CATH;  Surgeon: Aviva Signs, MD;  Location: [facility];  Service: General;  Laterality: Right;     SOCIAL HISTORY:  Social History   Socioeconomic History  . Marital status: Widowed    Spouse name: Not on file  . Number of children: Not on file  . Years of education: Not on file  . Highest education level: Not on file  Occupational History  . Not on file  Social Needs  . Financial resource strain: Not on file  . Food insecurity:    Worry: Not on file   Inability: Not on file  . Transportation needs:    Medical: Not on file    Non-medical: Not on file  Tobacco Use  . Smoking status: Unknown If Ever Smoked  . Smokeless tobacco: Never Used  Substance and Sexual Activity  . Alcohol use: Not Currently  . Drug use: Never  . Sexual activity: Not Currently  Lifestyle  . Physical activity:    Days per week: Not on file    Minutes per session: Not on file  . Stress: Not on file  Relationships  . Social connections:    Talks on phone: Not on file    Gets together: Not on file    Attends religious service: Not on file    Active member of club or organization: Not on file    Attends meetings of clubs or organizations: Not on file    Relationship status: Not on file  . Intimate partner violence:    Fear of current or ex partner: Not on file    Emotionally abused: Not on file    Physically abused: Not on file    Forced sexual activity: Not on file  Other Topics Concern  . Not on file  Social History Narrative   Lives at [facility] in [place]:  History reviewed. No pertinent family history.  CURRENT MEDICATIONS:  Outpatient Encounter Medications as of 05/30/2018  Medication Sig  . ALPRAZolam (XANAX) 0.25 MG tablet Take 0.25 mg by mouth every 6 (six) hours as needed for anxiety.  . cholecalciferol (VITAMIN D) 1000 units tablet Take 1,000 Units by mouth daily.  . divalproex (DEPAKOTE) 250 MG DR tablet Take 250 mg by mouth at bedtime.  . divalproex (DEPAKOTE) 500 MG DR tablet Take 500 mg by mouth daily.   Marland Kitchen donepezil (ARICEPT) 10 MG tablet Take 10 mg by mouth at bedtime.  . memantine (NAMENDA) 10 MG tablet Take 10 mg by mouth 2 (two) times daily.  . QUEtiapine (SEROQUEL) 25 MG tablet Take 12.5 mg by mouth 3 (three) times daily.  . sertraline (ZOLOFT) 100 MG tablet Take 100 mg by mouth daily.  Marland Kitchen lidocaine-prilocaine (EMLA) cream Apply to affected area once  . prochlorperazine (COMPAZINE) 10 MG tablet Take 1  tablet (10 mg total) by mouth every 6 (six) hours as needed (Nausea or vomiting).   No facility-administered encounter medications on file as of 05/30/2018.     ALLERGIES:  No Known Allergies   PHYSICAL EXAM:  ECOG Performance status: 2  Vitals:   05/30/18 0832  BP: 121/72  Pulse: 66  Resp: 18  Temp: 98.2 F (36.8 C)  SpO2: 100%   There were no vitals filed for this visit.  Physical Exam  Constitutional: [name] [name] is oriented to person, place, and time. [name] [name] appears well-developed and well-nourished.  Abdominal: Soft.  Musculoskeletal: Normal range of motion.  Neurological: [name] [name] is alert and oriented to person, place, and time.  Skin: Skin is warm and dry.  Psychiatric: [name] [name] has a normal mood and affect. Her behavior is normal. Judgment and thought content normal.  Breast: Left breast mass upper mid quadrant, Apx 3 inches, hard , non-tender freely mobile.  This is below the nipple.  Left axillary lymph node palpable.  No skin changes of the breast mass. Abdomen: No palpable hepatospleno megaly.   LABORATORY DATA:  I have reviewed the labs as listed.  CBC    Component Value Date/Time   WBC 6.8 05/30/2018 1014   RBC 4.54 05/30/2018 1014   HGB 13.2 05/30/2018 1014   HCT 41.9 05/30/2018 1014   PLT 288 05/30/2018 1014   MCV 92.3 05/30/2018 1014   MCH 29.1 05/30/2018 1014   MCHC 31.5 05/30/2018 1014   RDW 13.2 05/30/2018 1014   LYMPHSABS 1.4 05/30/2018 1014   MONOABS 0.5 05/30/2018 1014   EOSABS 0.1 05/30/2018 1014   BASOSABS 0.0 05/30/2018 1014   CMP Latest Ref Rng & Units 05/30/2018 04/28/2018  Glucose 70 - 99 mg/dL 88 117(H)  BUN 8 - 23 mg/dL 13 12  Creatinine 0.44 - 1.00 mg/dL 0.57 0.64  Sodium 135 - 145 mmol/L 140 140  Potassium 3.5 - 5.1 mmol/L 4.4 4.0  Chloride 98 - 111 mmol/L 104 103  CO2 22 - 32 mmol/L 27 28  Calcium 8.9 - 10.3 mg/dL 9.0 9.2  Total Protein 6.5 - 8.1 g/dL 7.4 7.5  Total Bilirubin 0.3 - 1.2 mg/dL 0.6 0.6  Alkaline Phos 38 - 126 U/L 67 80  AST 15 -  41 U/L 17 16  ALT 0 - 44 U/L 13 14       DIAGNOSTIC IMAGING:  I have independently reviewed images of the PET CT scan dated 05/11/2018 and discussed with the patient.     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 - PET/CT scan on 05/11/2018 shows bilateral hypermetabolic pulmonary nodules consistent with pulmonary metastasis with no metastatic disease outside of the thorax. - On 05/23/2018, lung biopsy shows poorly differentiated metastatic carcinoma, consistent with breast primary. - Echocardiogram on 05/03/2018 shows ejection fraction of 60 to 65%. - We talked about the normal prognosis of metastatic breast cancer which is treated in a palliative setting.  Patient is a resident at [facility] in [place] for the last few years secondary to dementia.  Her healthcare proxy was her Sister Neoma Laming who lives at [employer].  I have called her and talked to her in detail about her advanced breast cancer and treatment in the palliative setting. - I have recommended initiating her on combination chemo and HER-2 directed therapy with regimen containing docetaxel, trastuzumab and Pertuzumab.  We talked about the side effects including but not limited to alopecia, bone marrow suppression, life-threatening infections, peripheral neuropathy, nail changes, rare chance of CHF, diarrhea and electrolyte abnormalities.  [name] [name] and her sister understand and give [place] permission to proceed with the treatment. -If [name] [name] does not tolerate chemotherapy, we will discontinue it and continue HER-2 directed therapy. -[name] [name] already has a port placed.  We will plan to start her chemotherapy in the next few days. -We will also consider testing for hereditary breast cancer syndromes.  [name] [name] has 2 young  daughters. -We will see her back 2 weeks after initiation of chemotherapy to see how [name] [name] is tolerating it.  Total time spent is 40 minutes with more than 50% of time spent face-to-face discussing scan results, biopsy results, treatment plan, side effects and coordination of care.    Orders placed this encounter:  Orders Placed This Encounter  Procedures  . CBC with Differential  . Comprehensive metabolic panel  . CBC with Differential/Platelet  . Comprehensive metabolic panel  . Cancer antigen 27.29  . Cancer antigen 15-3  . CBC with Differential/Platelet  . Comprehensive metabolic panel  . PHYSICIAN COMMUNICATION ORDER      Derek Jack, [place] [phone]

## 2018-05-30 ENCOUNTER — Inpatient Hospital Stay (HOSPITAL_COMMUNITY): Payer: Medicaid Other

## 2018-05-30 ENCOUNTER — Encounter (HOSPITAL_COMMUNITY): Payer: Self-pay | Admitting: Hematology

## 2018-05-30 ENCOUNTER — Inpatient Hospital Stay (HOSPITAL_COMMUNITY): Payer: Medicaid Other | Attending: Internal Medicine | Admitting: Hematology

## 2018-05-30 VITALS — BP 121/72 | HR 66 | Temp 98.2°F | Resp 18

## 2018-05-30 DIAGNOSIS — C773 Secondary and unspecified malignant neoplasm of axilla and upper limb lymph nodes: Secondary | ICD-10-CM | POA: Diagnosis not present

## 2018-05-30 DIAGNOSIS — Z5112 Encounter for antineoplastic immunotherapy: Secondary | ICD-10-CM | POA: Insufficient documentation

## 2018-05-30 DIAGNOSIS — Z7189 Other specified counseling: Secondary | ICD-10-CM | POA: Insufficient documentation

## 2018-05-30 DIAGNOSIS — C50812 Malignant neoplasm of overlapping sites of left female breast: Secondary | ICD-10-CM

## 2018-05-30 DIAGNOSIS — F0390 Unspecified dementia without behavioral disturbance: Secondary | ICD-10-CM | POA: Diagnosis not present

## 2018-05-30 DIAGNOSIS — Z171 Estrogen receptor negative status [ER-]: Secondary | ICD-10-CM

## 2018-05-30 DIAGNOSIS — C7802 Secondary malignant neoplasm of left lung: Secondary | ICD-10-CM | POA: Insufficient documentation

## 2018-05-30 DIAGNOSIS — C50412 Malignant neoplasm of upper-outer quadrant of left female breast: Secondary | ICD-10-CM | POA: Insufficient documentation

## 2018-05-30 DIAGNOSIS — C50912 Malignant neoplasm of unspecified site of left female breast: Secondary | ICD-10-CM

## 2018-05-30 DIAGNOSIS — Z5111 Encounter for antineoplastic chemotherapy: Secondary | ICD-10-CM | POA: Diagnosis present

## 2018-05-30 LAB — CBC WITH DIFFERENTIAL/PLATELET
Basophils Absolute: 0 10*3/uL (ref 0.0–0.1)
Basophils Relative: 0 %
Eosinophils Absolute: 0.1 10*3/uL (ref 0.0–0.5)
Eosinophils Relative: 1 %
HCT: 41.9 % (ref 36.0–46.0)
Hemoglobin: 13.2 g/dL (ref 12.0–15.0)
LYMPHS PCT: 21 %
Lymphs Abs: 1.4 10*3/uL (ref 0.7–4.0)
MCH: 29.1 pg (ref 26.0–34.0)
MCHC: 31.5 g/dL (ref 30.0–36.0)
MCV: 92.3 fL (ref 80.0–100.0)
MONO ABS: 0.5 10*3/uL (ref 0.1–1.0)
Monocytes Relative: 8 %
NEUTROS ABS: 4.8 10*3/uL (ref 1.7–7.7)
NEUTROS PCT: 70 %
NRBC: 0 % (ref 0.0–0.2)
Platelets: 288 10*3/uL (ref 150–400)
RBC: 4.54 MIL/uL (ref 3.87–5.11)
RDW: 13.2 % (ref 11.5–15.5)
WBC: 6.8 10*3/uL (ref 4.0–10.5)

## 2018-05-30 LAB — COMPREHENSIVE METABOLIC PANEL
ALT: 13 U/L (ref 0–44)
ANION GAP: 9 (ref 5–15)
AST: 17 U/L (ref 15–41)
Albumin: 3.9 g/dL (ref 3.5–5.0)
Alkaline Phosphatase: 67 U/L (ref 38–126)
BILIRUBIN TOTAL: 0.6 mg/dL (ref 0.3–1.2)
BUN: 13 mg/dL (ref 8–23)
CO2: 27 mmol/L (ref 22–32)
Calcium: 9 mg/dL (ref 8.9–10.3)
Chloride: 104 mmol/L (ref 98–111)
Creatinine, Ser: 0.57 mg/dL (ref 0.44–1.00)
Glucose, Bld: 88 mg/dL (ref 70–99)
POTASSIUM: 4.4 mmol/L (ref 3.5–5.1)
Sodium: 140 mmol/L (ref 135–145)
TOTAL PROTEIN: 7.4 g/dL (ref 6.5–8.1)

## 2018-05-30 MED ORDER — LIDOCAINE-PRILOCAINE 2.5-2.5 % EX CREA: TOPICAL_CREAM | CUTANEOUS | 3 refills | Status: DC

## 2018-05-30 MED ORDER — PROCHLORPERAZINE MALEATE 10 MG PO TABS: 10.0000 mg | ORAL_TABLET | Freq: Four times a day (QID) | ORAL | 1 refills | Status: DC | PRN

## 2018-05-30 NOTE — Patient Instructions (Signed)
[facility]  Discharge Instructions:   _______________________________________________________________  Thank you for choosing [facility] to provide your oncology and hematology care.  To afford each patient quality time with our providers, please arrive at least 15 minutes before your scheduled appointment.  You need to re-schedule your appointment if you arrive 10 or more minutes late.  We strive to give you quality time with our providers, and arriving late affects you and other patients whose appointments are after yours.  Also, if you no show three or more times for appointments you may be dismissed from the clinic.  Again, thank you for choosing [facility]. Our hope is that these requests will allow you access to exceptional care and in a timely manner. _______________________________________________________________  If you have questions after your visit, please contact our office at [phone] between the hours of 8:30 a.m. and 5:00 p.m. Voicemails left after 4:30 p.m. will not be returned until the following business day. _______________________________________________________________  For prescription refill requests, have your pharmacy contact our office. _______________________________________________________________  Recommendations made by the consultant and any test results will be sent to your referring physician. _______________________________________________________________ 

## 2018-05-30 NOTE — Progress Notes (Signed)
START ON PATHWAY REGIMEN - Breast     A cycle is every 21 days:     Pertuzumab      Pertuzumab      Trastuzumab-xxxx      Trastuzumab-xxxx      Docetaxel   **Always confirm dose/schedule in your pharmacy ordering system**  Patient Characteristics: Distant Metastases or Locoregional Recurrent Disease - Unresected or Locally Advanced Unresectable Disease Progressing after Neoadjuvant and Local Therapies, HER2 Positive, ER Negative/Unknown, Chemotherapy, First Line Therapeutic Status: Distant Metastases BRCA Mutation Status: Did Not Order Test ER Status: Negative (-) HER2 Status: Positive (+) PR Status: Negative (-) Line of therapy: First Line Intent of Therapy: Non-Curative / Palliative Intent, Discussed with Patient

## 2018-05-30 NOTE — Assessment & Plan Note (Signed)
1.  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 - PET/CT scan on 05/11/2018 shows bilateral hypermetabolic pulmonary nodules consistent with pulmonary metastasis with no metastatic disease outside of the thorax. - On 05/23/2018, lung biopsy shows poorly differentiated metastatic carcinoma, consistent with breast primary. - Echocardiogram on 05/03/2018 shows ejection fraction of 60 to 65%. - We talked about the normal prognosis of metastatic breast cancer which is treated in a palliative setting.  Patient is a resident at [facility] in [place] for the last few years secondary to dementia.  Her healthcare proxy was her Sister Neoma Laming who lives at [employer].  I have called her and talked to her in detail about her advanced breast cancer and treatment in the palliative setting. - I have recommended initiating her on combination chemo and HER-2 directed therapy with regimen containing docetaxel, trastuzumab and Pertuzumab.  We talked about the side effects including but not limited to alopecia, bone marrow suppression, life-threatening infections, peripheral neuropathy, nail changes, rare chance of CHF, diarrhea and electrolyte abnormalities.  She and her sister understand and give [place] permission to proceed with the treatment. -If she does not tolerate chemotherapy, we will discontinue it and continue HER-2 directed therapy. -She already has a port placed.  We will plan to start her chemotherapy in the next few days. -We will also consider testing for hereditary breast cancer syndromes.  She has 2 young daughters. -We will see her back 2 weeks after initiation of chemotherapy to see how she is tolerating it.

## 2018-05-31 NOTE — Progress Notes (Signed)
Chemotherapy and immunotherapy teaching pulled together. 

## 2018-05-31 NOTE — Patient Instructions (Signed)
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  We are going to treat your cancer with palliative intent, which means that your cancer is treatable but not curable.  We are going to be treating you with chemotherapy through your port a cath once every 21 days.  We are going to be treating you with Docetaxel (Taxotere), Herceptin (trastuzumab) and Perjeta (pertuzumab).  We will be giving you Udenyca (pegfilgrastim) two days after chemotherapy to help boost your white blood cell counts.  You will see the doctor regularly throughout treatment.  We monitor your lab work prior to every treatment. The doctor monitors your response to treatment by the way you are feeling, your blood work, and scans periodically.  There will be wait times while you are here for treatment.  It will take about 30 minutes to 1 hour for your lab work to result.  Then there will be wait times while pharmacy mixes your medications.     Premedication: You will be given the following medication prior to treatment each time.  Tylenol and Benadryl - given to prevent reaction to the chemotherapy  Decadron (dexamethasone) - steroid - given to reduce the risk of you having an allergic type reaction to the chemotherapy. Decadron can cause you to feel energized, nervous/anxious/jittery, make you have trouble sleeping, and/or make you feel hot/flushed in the face/neck and/or look pink/red in the face/neck. These side effects will pass as the medication wears off.  Docetaxel (Taxotere)  About This Drug Docetaxel is used to treat cancer. It is given in the vein (IV).  Possible Side Effects  Bone marrow suppression. This is a decrease in the number of white blood cells, red blood cells, and platelets. This may raise your risk of infection, make you tired and weak (fatigue), and raise your risk of bleeding.  Fever in the setting of decreased white blood  cells, which is a serious condition that can be life threatening  Soreness of the mouth and throat. You may have red areas, white patches, or sores that hurt.  Nausea and vomiting (throwing up)  Constipation (not able to move bowels)  Diarrhea (loose bowel movements)  Infections  Swelling of your legs, ankles, and/or feet  Changes in the way food and drinks taste  Effects on the nerves are called peripheral neuropathy. You may feel numbness, tingling, or pain in your hands and feet. It may be hard for you to button your clothes, open jars, or walk as usual. The effect on the nerves may get worse with more doses of the drug. These effects get better in some people after the drug is stopped but it does not get better in all people.  Decreased appetite (decreased hunger)  Weakness  Pain  Muscle pain/aching  Trouble breathing  Changes in your nail color, you may have nail loss and/or brittle nail  Hair loss. Hair loss is often temporary, although there have been cases of permanent hair loss reported. Hair loss may happen suddenly or gradually. If you lose hair, you may lose it from your head, face, armpits, pubic area, chest, and/or legs. You may also notice your hair getting thin.  Allergic skin reaction. You may develop blisters on your skin that are filled with fluid or a severe red rash all over your body that may be painful.  Allergic reactions, including anaphylaxis are rare but may happen in some patients. Signs of allergic reaction to  this drug may be swelling of the face, feeling like your tongue or throat are swelling, trouble breathing, rash, itching, fever, chills, feeling dizzy, and/or feeling that your heart is beating in a fast or not normal way. If this happens, do not take another dose of this drug. You should get urgent medical treatment.  Note: Not all possible side effects are included above.  Warnings and Precautions  Severe bone marrow  suppression  Severe allergic reactions, including anaphylaxis which can be life-threatening.  Swelling (inflammation) in the colon in the setting of severely low white blood cells, which raises your risk of infection and can be life-threatening  Severe swelling in the eye or other changes in eyesight  Severe swelling of your legs, ankles and/or feet. Sometimes, fluid can build up in your lungs and/or around your heart causing you trouble breathing.  If you have a history of abnormal liver function, receive high doses of docetaxel, or have a history of lung cancer and have received treatment with a platinum (type of chemotherapy medication), you have an increased risk of death  Severe weakness  This drug may raise your risk of getting a second cancer such as leukemia and myelodysplastic syndrome  Severe peripheral neuropathy and/or numbness, tingling or a sensation of pins and needles in your arms, hands, legs or feet  This drug contains alcohol and may affect your central nervous system. The central nervous system is made up of your brain and spinal cord. You may feel drunk during and after your treatment and it can impair your ability to drive or use machinery for one to two hours after infusion.  Note: Some of the side effects above are very rare. If you have concerns and/or questions, please discuss them with your medical team.  Important Information  This drug may be present in the saliva, tears, sweat, urine, stool, vomit, semen, and vaginal secretions. Talk to your doctor and/or your nurse about the necessary precautions to take during this time.  Treating Side Effects  Manage tiredness by pacing your activities for the day.  Be sure to include periods of rest between energy-draining activities.  Get regular exercise. If you feel too tired to exercise vigorously, try taking a short walk.  To decrease the risk of infection, wash your hands regularly.  Avoid close  contact with people who have a cold, the flu, or other infections.  Take your temperature as your doctor or nurse tells you, and whenever you feel like you may have a fever.  To help decrease bleeding, use a soft toothbrush. Check with your nurse before using dental floss.  Be very careful when using knives or tools.  Use an electric shaver instead of a razor.  Mouth care is very important. Your mouth care should consist of routine, gentle cleaning of your teeth or dentures and rinsing your mouth with a mixture of 1/2 teaspoon of salt in 8 ounces of water or 1/2 teaspoon of baking soda in 8 ounces of water. This should be done at least after each meal and at bedtime.  If you have mouth sores, avoid mouthwash that has alcohol. Also avoid alcohol and smoking because they can bother your mouth and throat.  Ask your doctor or nurse about medicines that are available to help stop or lessen constipation and/ or diarrhea.  If you are not able to move your bowels, check with your doctor or nurse before you use enemas, laxatives, or suppositories.  Drink plenty of fluids (a minimum  of eight glasses per day is recommended).  If you throw up or have loose bowel movements, you should drink more fluids so that you do not become dehydrated (lack of water in the body from losing too much fluid).  If you get diarrhea, eat low-fiber foods that are high in protein and calories and avoid foods that can irritate your digestive tracts or lead to cramping.  To help with nausea and vomiting, eat small, frequent meals instead of three large meals a day. Choose foods and drinks that are at room temperature. Ask your nurse or doctor about other helpful tips and medicine that is available to help or stop lessen these symptoms.  To help with decreased appetite, eat foods high in calories and protein, such as meat, poultry, fish, dry beans, tofu, eggs, nuts, milk, yogurt, cheese, ice cream, pudding, and  nutritional supplements.  Consider using sauces and spices to increase taste. Daily exercise, with your doctor's approval, may increase your appetite.  Keeping your pain under control is important to your well-being. Please tell your doctor or nurse if you are experiencing pain.  If you get a rash do not put anything on it unless your doctor or nurse says you may. Keep the area around the rash clean and dry. Ask your doctor for medicine if your rash bothers you.  Keeping your nails moisturized may help with brittleness.  To help with hair loss, wash with a mild shampoo and avoid washing your hair every day.  Avoid rubbing your scalp, pat your hair or scalp dry.  Avoid coloring your hair.  Limit your use of hair spray, electric curlers, blow dryers, and curling irons.  If you are interested in getting a wig, talk to your nurse. You can also call the [facility] at 800-ACS-2345 to find out information about the Look Good, Feel Better program close to where you live. It is a free program where women getting chemotherapy can learn about wigs, turbans and scarves as well as makeup techniques and skin and nail care.  If you have numbness and tingling in your hands and feet, be careful when cooking, walking, and handling sharp objects and hot liquids.  Food and Drug Interactions  There are no known interactions of docetaxel with food.  This drug may interact with other medicines. Tell your doctor and pharmacist about all the prescription and over-the-counter medicines and dietary supplements (vitamins, minerals, herbs and others) that you are taking at this time. Also, check with your doctor or pharmacist before starting any new prescription or over-the-counter medicines, or dietary supplements to make sure that there are no interactions.  When to Call the Doctor Call your doctor or nurse if you have any of these symptoms and/or any new or unusual symptoms:  Fever of  100.4 F (38 C) or higher  Chills  Blurred vision or other changes in eyesight  Easy bruising or bleeding  Wheezing or trouble breathing  Feeling dizzy or lightheaded  Fatigue that interferes with your daily activities  Pain in your mouth or throat that makes it hard to eat or drink  Nausea that stops you from eating or drinking and/or is not relieved by prescribed medicines  Throwing up more than 3 times a day  Lasting loss of appetite or rapid weight loss of five pounds in a week  Diarrhea, 4 times in one day or diarrhea with lack of strength or a feeling of being dizzy  No bowel movement in 3 days or  when you feel uncomfortable  Severe abdominal pain that does not go away  Numbness, tingling, or pain your hands and feet  Swelling of legs, ankles, or feet  Weight gain of 5 pounds in one week (fluid retention)  Extreme weakness that interferes with normal activities  New rash and/or itching  Rash that is not relieved by prescribed medicines  Signs of allergic reaction: swelling of the face, feeling like your tongue or throat are swelling, trouble breathing, rash, itching, fever, chills, feeling dizzy, and/or feeling that your heart is beating in a fast or not normal way  Signs of possible liver problems: dark urine, pale bowel movements, bad stomach pain, feeling very tired and weak, unusual itching, or yellowing of the eyes or skin  Symptoms of being drunk, confusion, or being very sleepy  If you think you may be pregnant  Reproduction Warnings  Pregnancy warning: This drug can have harmful effects on the unborn baby. Women of childbearing potential should use effective methods of birth control during your cancer treatment. Let your doctor know right away if you think you may be pregnant.  Breastfeeding warning: It is not known if this drug passes into breast milk. For this reason, women should talk to their doctor about the risks and benefits of  breastfeeding during treatment with this drug because this drug may enter the breast milk and cause harm to a breastfeeding baby.  Fertility warning: Human fertility studies have not been done with this drug. Talk with your doctor or nurse if you plan to have children. Ask for information on sperm or egg banking.  Pertuzumab (Perjeta)  About This Drug Pertuzumab is used to treat cancer. It is given in the vein (IV).  Possible Side Effects  Bone marrow suppression. This is a decrease in the number of white blood cells, red blood cells, and platelets. This may raise your risk of infection, make you tired and weak (fatigue), and raise your risk of bleeding.  Nausea and vomiting (throwing up)  Diarrhea (loose bowel movements)  Unable to move bowels (constipation)  Tiredness  Headache  Effects on the nerves are called peripheral neuropathy. You may feel numbness, tingling, or pain in your hands and feet. It may be hard for you to button your clothes, open jars, or walk as usual. The effect on the nerves may get worse with more doses of the drug. These effects get better in some people after the drug is stopped but it does not get better in all people.  Rash  Hair loss. Hair loss is often temporary, although with certain medicine, hair loss can sometimes be permanent. Hair loss may happen suddenly or gradually. If you lose hair, you may lose it from your head, face, armpits, pubic area, chest, and/or legs. You may also notice your hair getting thin.  Note: Each of the side effects above was reported in 30% or greater of patients treated with pertuzumab. Not all possible side effects are included above.  Warnings and Precautions  Congestive heart failure - your heart has less ability to pump blood properly. You may be short of breath. Your arms, hands, legs and feet may swell.  Allergic reactions, including anaphylaxis are rare but may happen in some patients. Signs of  allergic reaction to this drug may be swelling of the face, feeling like your tongue or throat are swelling, trouble breathing, rash, itching, fever, chills, feeling dizzy, and/or feeling that your heart is beating in a fast or not normal way.  If this happens, do not take another dose of this drug. You should get urgent medical treatment.  While you are getting this drug in your vein (IV), you may have a reaction to the drug. Sometimes you may be given medication to stop or lessen these side effects. Your nurse will check you closely for these signs: fever or shaking chills, flushing, facial swelling, feeling dizzy, headache, trouble breathing, rash, itching, chest tightness, or chest pain. These reactions may happen after your infusion. If this happens, call 911 for emergency care.  Note: Some of the side effects above are very rare. If you have concerns and/or questions, please discuss them with your medical team.  Important Information  This drug may be present in the saliva, tears, sweat, urine, stool, vomit, semen, and vaginal secretions. Talk to your doctor and/or your nurse about the necessary precautions to take during this time.  Treating Side Effects  Manage tiredness by pacing your activities for the day.  Be sure to include periods of rest between energy-draining activities.  To decrease the risk of infection, wash your hands regularly.  Avoid close contact with people who have a cold, the flu, or other infections.  Take your temperature as your doctor or nurse tells you, and whenever you feel like you may have a fever.  To help decrease the risk of bleeding, use a soft toothbrush. Check with your nurse before using dental floss.  Be very careful when using knives or tools.  Use an electric shaver instead of a razor.  Drink plenty of fluids (a minimum of eight glasses per day is recommended).  To help with nausea and vomiting, eat small, frequent meals instead  of three large meals a day. Choose foods and drinks that are at room temperature. Ask your nurse or doctor about other helpful tips and medicine that is available to help stop or lessen these symptoms.  If you throw up or have loose bowel movements, you should drink more fluids so that you do not become dehydrated (lack of water in the body from losing too much fluid).  If you have diarrhea, eat low-fiber foods that are high in protein and calories and avoid foods that can irritate your digestive tracts or lead to cramping.  Ask your nurse or doctor about medicine that can lessen or stop your diarrhea or constipation.  If you are not able to move your bowels, check with your doctor or nurse before you use enemas, laxatives, or suppositories.  If you get a rash do not put anything on it unless your doctor or nurse says you may. Keep the area around the rash clean and dry. Ask your doctor for medicine if your rash bothers you.  If you have numbness and tingling in your hands and feet, be careful when cooking, walking, and handling sharp objects and hot liquids.  Infusion reactions may occur after your infusion. If this happens, call 911 for emergency care.  To help with hair loss, wash with a mild shampoo and avoid washing your hair every day.  Avoid rubbing your scalp, pat your hair or scalp dry.  Avoid coloring your hair.  Limit your use of hair spray, electric curlers, blow dryers, and curling irons.  If you are interested in getting a wig, talk to your nurse. You can also call the [facility] at 800-ACS-2345 to find out information about the Look Good, Feel Better program close to where you live. It is a free program where women  getting chemotherapy can learn about wigs, turbans and scarves as well as makeup techniques and skin and nail care.  Keeping your pain under control is important to your well-being. Please tell your doctor or nurse if you are experiencing  pain.  Food and Drug Interactions  There are no known interactions of pertuzumab with food.  This drug may interact with other medicines. Tell your doctor and pharmacist about all the prescription and over-the-counter medicines and dietary supplements (vitamins, minerals, herbs and others) that you are taking at this time. Also, check with your doctor or pharmacist before starting any new prescription or over-the-counter medicines, or dietary supplements to make sure that there are no interactions.  When to Call the Doctor Call your doctor or nurse if you have any of these symptoms and/or any new or unusual symptoms:  Fever of 100.4 F (38 C) or higher  Chills  Trouble breathing  Tiredness that interferes with your daily activities  Feeling dizzy or lightheaded  Easy bleeding or bruising  Swelling of legs, ankles, or feet  Weight gain of 5 pounds in one week (fluid retention)  Headache that does not go away  Nausea that stops you from eating or drinking and/or is not relieved by prescribed medicines  Throwing up more than 3 times a day  Diarrhea, 4 times in one day or diarrhea with lack of strength or a feeling of being dizzy  No bowel movement in 3 days or when you feel uncomfortable  A new rash or a rash that is not relieved by prescribed medicines  Numbness, tingling, or pain in your hands and feet  Signs of allergic reaction: swelling of the face, feeling like your tongue or throat are swelling, trouble breathing, rash, itching, fever, chills, feeling dizzy, and/or feeling that your heart is beating in a fast or not normal way. If this happens, call 911 for emergency care.  Signs of infusion reaction: fever or shaking chills, flushing, facial swelling, feeling dizzy, headache, trouble breathing, rash, itching, chest tightness, or chest pain. If this happens, call 911 for emergency care.  If you think you may be pregnant  Reproduction Warnings  Pregnancy  warning: This drug can have harmful effects on the unborn baby. Women of childbearing potential should use effective methods of birth control during your cancer treatment and for 7 months after treatment. Let your doctor know right away if you think you may be pregnant.  Breastfeeding warning: It is not known if this drug passes into breast milk. For this reason, women should not breastfeed during treatment and for 7 months after treatment because this drug could enter the breast milk and cause harm to a breastfeeding baby.  Fertility warning: Human fertility studies have not been done with this drug. Talk with your doctor or nurse if you plan to have children. Ask for information on sperm or egg banking.  Trastuzumab-(Herceptin, Kanjinti)  About This Drug Trastuzumab-xxxx is used to treat cancer. It is given in the vein (IV)  Possible Side Effects  Bone marrow suppression. This is a decrease in the number of white blood cells, red blood cells, and platelets. This may raise your risk of infection, make you tired and weak (fatigue), and raise your risk of bleeding.  Congestive heart failure - your heart has less ability to pump blood properly  Soreness of the mouth and throat. You may have red areas, white patches, or sores that hurt  Nausea  Diarrhea (loose bowel movements)  Fever  Chills  Tiredness  Infection  Inflammation of nasal passages and throat  Changes in the way food and drinks taste  Weight loss  Headache  Trouble sleeping  Cough  Upper respiratory infection  Rash  Note: Each of the side effects above was reported in 10% or greater of patients treated with trastuzumab-xxxx. Not all possible side effects are included above.  Warnings and Precautions  Changes in the tissue of the heart and heart function. Some changes may happen that can cause your heart to have less ability to pump blood. This drug may also increase your risk of heart attack.   Serious and life-threatening lung problems such as inflammation (swelling) and scarring of the lungs which makes breathing difficult.  While you are getting this drug in your vein (IV), you may have a reaction to the drug. Sometimes you may be given medication to stop or lessen these side effects. Your nurse will check you closely for these signs: fever or shaking chills, flushing, facial swelling, feeling dizzy, headache, trouble breathing, rash, itching, chest tightness, or chest pain. These reactions may happen after your infusion. If this happens, call 911 for emergency care.  Severe decrease in the number of white blood cells. This may raise your risk of infection which may be life-threatening.  Note: Some of the side effects above are very rare. If you have concerns and/or questions, please discuss them with your medical team.  Important Information  This drug may be present in the saliva, tears, sweat, urine, stool, vomit, semen, and vaginal secretions. Talk to your doctor and/or your nurse about the necessary precautions to take during this time.  Treating Side Effects  Manage tiredness by pacing your activities for the day.  Be sure to include periods of rest between energy-draining activities.  To decrease the risk of infection, wash your hands regularly.  Avoid close contact with people who have a cold, the flu, or other infections.  Take your temperature as your doctor or nurse tells you, and whenever you feel like you may have a fever.  To help decrease the risk of bleeding, use a soft toothbrush. Check with your nurse before using dental floss.  Be very careful when using knives or tools.  Use an electric shaver instead of a razor.  Drink plenty of fluids (a minimum of eight glasses per day is recommended).  To help with nausea, eat small, frequent meals instead of three large meals a day. Choose foods and drinks that are at room temperature. Ask your nurse  or doctor about other helpful tips and medicine that is available to help stop or lessen these symptoms.  Mouth care is very important. Your mouth care should consist of routine, gentle cleaning of your teeth or dentures and rinsing your mouth with a mixture of 1/2 teaspoon of salt in 8 ounces of water or 1/2 teaspoon of baking soda in 8 ounces of water. This should be done at least after each meal and at bedtime.  If you have mouth sores, avoid mouthwash that has alcohol. Also avoid alcohol and smoking because they can bother your mouth and throat.  If you throw up or have loose bowel movements, you should drink more fluids so that you do not become dehydrated (lack of water in the body from losing too much fluid).  If you have diarrhea, eat low-fiber foods that are high in protein and calories and avoid foods that can irritate your digestive tracts or lead to cramping.  Ask your nurse  or doctor about medicine that can lessen or stop your diarrhea.  To help with weight loss, drink fluids that contribute calories (whole milk, juice, soft drinks, sweetened beverages, milkshakes, and nutritional supplements) instead of water.  Include a source of protein at every meal and snack, such as meat, poultry, fish, dry beans, tofu, eggs, nuts, milk, yogurt, cheese, ice cream, pudding, and nutritional supplements.  If you get a rash do not put anything on it unless your doctor or nurse says you may. Keep the area around the rash clean and dry. Ask your doctor for medicine if your rash bothers you.  Keeping your pain under control is important to your well-being. Please tell your doctor or nurse if you are experiencing pain.  If you are having trouble sleeping, talk to your nurse or doctor on tips to help you sleep better  Infusion reactions may occur after your infusion. If this happens, call 911 for emergency care.  Food and Drug Interactions  There are no known interactions of  trastuzumab-xxxx with food.  This drug may interact with other medicines. Tell your doctor and pharmacist about all the prescription and over-the-counter medicines and dietary supplements (vitamins, minerals, herbs and others) that you are taking at this time. Also, check with your doctor or pharmacist before starting any new prescription or over-the-counter medicines, or dietary supplements to make sure that there are no interactions.  When to Call the Doctor Call your doctor or nurse if you have any of these symptoms and/or any new or unusual symptoms:  Fever of 100.4 F (38 C) or higher  Chills  Tiredness that interferes with your daily activities  Trouble falling or staying asleep  Feeling dizzy or lightheaded  A headache that does not go away  Easy bleeding or bruising  Wheezing or trouble breathing or dry cough  Coughing up yellow, green, or bloody mucus  Feeling that your heart is beating in a fast or not normal way (palpitations)  Chest pain or symptoms of a heart attack. Most heart attacks involve pain in the center of the chest that lasts more than a few minutes. The pain may go away and come back, or it can be constant. It can feel like pressure, squeezing, fullness, or pain. Sometimes pain is felt in one or both arms, the back, neck, jaw, or stomach. If any of these symptoms last 2 minutes, call 911.  Pain in your mouth or throat that makes it hard to eat or drink  Nausea that stops you from eating or drinking and/or is not relieved by prescribed medicines  Diarrhea, 4 times in one day or diarrhea with lack of strength or a feeling of being dizzy  Lasting loss of appetite or rapid weight loss of five pounds in a week  Swelling of arms, hand, legs, and/or feet  Weight gain of 5 pounds in one week (fluid retention)  A new rash and/or itching that is not relieved by prescribed medicines  Signs of infusion reaction: fever or shaking chills, flushing, facial  swelling, feeling dizzy, headache, trouble breathing, rash, itching, chest tightness, or chest pain. If this happens call 911 for emergency care.  If you think you may be pregnant  Reproduction Warnings  Pregnancy warning: This drug can have harmful effects on the unborn baby. Women of childbearing potential should use effective methods of birth control during your cancer treatment and for 7 months after treatment. Let your doctor know right away if you think you may be pregnant  during treatment or within 7 months of receiving treatment.  Breastfeeding warning: It is not known if this drug passes into breast milk. For this reason, women should talk to their doctor about the risks and benefits of breastfeeding during treatment with this drug and for 7 months after treatment because this drug may enter the breast milk and cause harm to a breastfeeding baby.  Fertility warning: Human fertility studies have not been done with this drug. Talk with your doctor or nurse if you plan to have children. Ask for information on sperm or egg banking.  Pegfilgrastim-xxxx (Neulasta, Neulasta Onpro, Fulphila, Udenyca)  About This Drug  Pegfilgrastim-xxxx belongs to a class of medicines called granulocyte colony-stimulating factor (G-CSF). G-CSF helps the body make more white blood cells. White blood cells help fight infection in your body. This drug is given as an injection under the skin (subcutaneously).  Possible Side Effects  Bone pain  Pain in your arms and/or legs  Note: Each of the side effects above was reported in 5% or greater of patients treated with pegfilgrastim-xxxx. Not all possible side effects are included above.  Warnings and Precautions  Enlargement and inflammation (swelling) of your spleen, which can very rarely rupture and be lifethreatening. Signs of enlargement may be left-sided pain in your abdomen and/or shoulder.  Trouble breathing because of fluid build-up  around your lungs and/or inflammation (swelling) of the lungs.  Allergic reactions, including anaphylaxis are rare but may happen in some patients. Signs of allergic reaction to this drug may be swelling of the face, feeling like your tongue or throat are swelling, trouble breathing, rash, itching, fever, chills, feeling dizzy, and/or feeling that your heart is beating in a fast or not normal way. If this happens, do not take another dose of this drug. You should get urgent medical treatment.  Sickle cell crisis in sickle cell patients treated with pegfilgrastim-xxxx  Changes in your kidney function  A rapid increase in your white blood cells may happen  A syndrome where fluid and protein can leak from your blood vessels into your tissues. This can cause a decrease in your blood protein level and blood pressure and fluid can accumulate in your tissues and/or lungs.  The on-body injector uses acrylic adhesive. Caution should be used if you have an allergy to acrylic adhesives.  Missed or partial doses of the medication have been reported from the on-body injector device not working properly. This could increase your risk of developing severely low white blood cells, which puts you at a risk of severe and life-threatening infections. If you have any problems with the onbody injector, contact your doctor and/or nurse right away.  Inflammation of the aorta- symptoms may include fever, abdominal pain, back pain and feeling tired.  Note: Some of the side effects above are very rare. If you have concerns and/or questions, please discuss them with your medical team.  Important Information  If you have the on-body injector (OBI) placed, avoid activities such as traveling, driving, or operating heavy machinery during hours 26-29 after it is placed.  How to Take Your Medication  Talk to your doctor, nurse and/or pharmacist for proper preparation, dosing, administration. It is important  that you follow the instructions and precautions closely.  Treating Side Effects  Keeping your pain under control is important to your well-being. Please tell your doctor or nurse if you are experiencing pain.  Food and Drug Interactions  There are no known interactions of pegfilgrastim-xxxx with food.  Tell  your doctor and pharmacist about all the prescription and over-the-counter medicines and dietary supplements (vitamins, minerals, herbs and others) that you are taking at this time. Also, check with your doctor or pharmacist before starting any new prescription or over-the-counter medicines, or dietary supplements to make sure that there are no interactions.  When to Call the Doctor Call your doctor or nurse if you have any of these symptoms and/or any new or unusual symptoms:  Fever of 100.4 F (38 C) or higher  Chills  Cough  Wheezing and/or trouble breathing  Feeling dizzy or lightheaded  Tiredness that interferes with your daily activities  Pain in the left side of the abdomen and/or shoulder pain  Back pain  Decreased urine, or very dark urine  Pain that does not go away or is not relieved by prescribed medicine  Swelling of legs, ankles, or feet  Signs of allergic reaction: swelling of the face, feeling like your tongue or throat are swelling, trouble breathing, rash, itching, fever, chills, feeling dizzy, and/or feeling that your heart is beating in a fast or not normal way. If this happens, call 911 for emergency care.  if you think you may be pregnant  Reproduction Warnings  Pregnancy warning: It is not known if this drug may harm an unborn child. For this reason, be sure to talk with your doctor if you are pregnant or planning to become pregnant while receiving this drug. Let your doctor know right away if you think you may be pregnant.  Breastfeeding warning: It is not known if this drug passes into breast milk. For this reason, women should talk  to their doctor about the risks and benefits of breastfeeding during treatment with this drug because this drug may enter the breast milk and cause harm to a breastfeeding baby.  Fertility warning: Human fertility studies have not been done with this drug. Talk with your doctor or nurse if you plan to have children. Ask for information on sperm or egg banking.  SELF CARE ACTIVITIES WHILE ON CHEMOTHERAPY:  Hydration Increase your fluid intake 48 hours prior to treatment and drink at least 8 to 12 cups (64 ounces) of water/decaffeinated beverages per day after treatment. You can still have your cup of coffee or soda but these beverages do not count as part of your 8 to 12 cups that you need to drink daily. No alcohol intake.  Medications Continue taking your normal prescription medication as prescribed.  If you start any new herbal or new supplements please let us know first to make sure it is safe.  Mouth Care Have teeth cleaned professionally before starting treatment. Keep dentures and partial plates clean. Use soft toothbrush and do not use mouthwashes that contain alcohol. Biotene is a good mouthwash that is available at most pharmacies or may be ordered by calling [phone]. Use warm salt water gargles (1 teaspoon salt per 1 quart warm water) before and after meals and at bedtime. Or you may rinse with 2 tablespoons of three-percent hydrogen peroxide mixed in eight ounces of water. If you are still having problems with your mouth or sores in your mouth please call the clinic. If you need dental work, please let the doctor know before you go for your appointment so that we can coordinate the best possible time for you in regards to your chemo regimen. You need to also let your dentist know that you are actively taking chemo. We may need to do labs prior to your dental  appointment.  Skin Care Always use sunscreen that has not expired and with SPF (Sun Protection Factor) of 50 or higher.  Wear hats to protect your head from the sun. Remember to use sunscreen on your hands, ears, face, & feet.  Use good moisturizing lotions such as udder cream, eucerin, or even Vaseline. Some chemotherapies can cause dry skin, color changes in your skin and nails.     Avoid long, hot showers or baths.  Use gentle, fragrance-free soaps and laundry detergent.  Use moisturizers, preferably creams or ointments rather than lotions because the thicker consistency is better at preventing skin dehydration. Apply the cream or ointment within 15 minutes of showering. Reapply moisturizer at night, and moisturize your hands every time after you wash them.  Hair Loss (if your doctor says your hair will fall out)   If your doctor says that your hair is likely to fall out, decide before you begin chemo whether you want to wear a wig. You may want to shop before treatment to match your hair color.  Hats, turbans, and scarves can also camouflage hair loss, although some people prefer to leave their heads uncovered. If you go bare-headed outdoors, be sure to use sunscreen on your scalp.  Cut your hair short. It eases the inconvenience of shedding lots of hair, but it also can reduce the emotional impact of watching your hair fall out.  Don't perm or color your hair during chemotherapy. Those chemical treatments are already damaging to hair and can enhance hair loss. Once your chemo treatments are done and your hair has grown back, it's OK to resume dyeing or perming hair. With chemotherapy, hair loss is almost always temporary. But when it grows back, it may be a different color or texture. In older adults who still had hair color before chemotherapy, the new growth may be completely gray.  Often, new hair is very fine and soft.  Infection Prevention Please wash your hands for at least 30 seconds using warm soapy water. Handwashing is the #1 way to prevent the spread of germs. Stay away from sick people or people  who are getting over a cold. If you develop respiratory systems such as green/yellow mucus production or productive cough or persistent cough let us know and we will see if you need an antibiotic. It is a good idea to keep a pair of gloves on when going into grocery stores/[employer] to decrease your risk of coming into contact with germs on the carts, etc. Carry alcohol hand gel with you at all times and use it frequently if out in public. If your temperature reaches 100.5 or higher please call the clinic and let us know.  If it is after hours or on the weekend please go to the ER if your temperature is over 100.5.  Please have your own personal thermometer at home to use.    Sex and bodily fluids If you are going to have sex, a condom must be used to protect the person that isn't taking chemotherapy. Chemo can decrease your libido (sex drive). For a few days after chemotherapy, chemotherapy can be excreted through your bodily fluids.  When using the toilet please close the lid and flush the toilet twice.  Do this for a few day after you have had chemotherapy.   Effects of chemotherapy on your sex life Some changes are simple and won't last long. They won't affect your sex life permanently. Sometimes you may feel:  too tired  not  strong enough to be very active  sick or sore   not in the mood  anxious or low Your anxiety might not seem related to sex. For example, you may be worried about the cancer and how your treatment is going. Or you may be worried about money, or about how you family are coping with your illness. These things can cause stress, which can affect your interest in sex. It's important to talk to your partner about how you feel. Remember - the changes to your sex life don't usually last long. There's usually no medical reason to stop having sex during chemo. The drugs won't have any long term physical effects on your performance or enjoyment of sex. Cancer can't be passed on to  your partner during sex  Contraception It's important to use reliable contraception during treatment. Avoid getting pregnant while you or your partner are having chemotherapy. This is because the drugs may harm the baby. Sometimes chemotherapy drugs can leave a man or woman infertile.  This means you would not be able to have children in the future. You might want to talk to someone about permanent infertility. It can be very difficult to learn that you may no longer be able to have children. Some people find counselling helpful. There might be ways to preserve your fertility, although this is easier for men than for women. You may want to speak to a fertility expert. You can talk about sperm banking or harvesting your eggs. You can also ask about other fertility options, such as donor eggs. If you have or have had breast cancer, your doctor might advise you not to take the contraceptive pill. This is because the hormones in it might affect the cancer.  It is not known for sure whether or not chemotherapy drugs can be passed on through semen or secretions from the vagina. Because of this some doctors advise people to use a barrier method if you have sex during treatment. This applies to vaginal, anal or oral sex. Generally, doctors advise a barrier method only for the time you are actually having the treatment and for about a week after your treatment. Advice like this can be worrying, but this does not mean that you have to avoid being intimate with your partner. You can still have close contact with your partner and continue to enjoy sex.  Animals If you have cats or birds we just ask that you not change the litter or change the cage.  Please have someone else do this for you while you are on chemotherapy.   Food Safety During and After Cancer Treatment Food safety is important for people both during and after cancer treatment. Cancer and cancer treatments, such as chemotherapy, radiation therapy,  and stem cell/bone marrow transplantation, often weaken the immune system. This makes it harder for your body to protect itself from foodborne illness, also called food poisoning. Foodborne illness is caused by eating food that contains harmful bacteria, parasites, or viruses.  Foods to avoid Some foods have a higher risk of becoming tainted with bacteria. These include:  Unwashed fresh fruit and vegetables, especially leafy vegetables that can hide dirt and other contaminants  Raw sprouts, such as alfalfa sprouts  Raw or undercooked beef, especially ground beef, or other raw or undercooked meat and poultry  Fatty, fried, or spicy foods immediately before or after treatment.  These can sit heavy on your stomach and make you feel nauseous.  Raw or undercooked shellfish, such as oysters.  Sushi and sashimi, which often contain raw fish.   Unpasteurized beverages, such as unpasteurized fruit juices, raw milk, raw yogurt, or cider  Undercooked eggs, such as soft boiled, over easy, and poached; raw, unpasteurized eggs; or foods made with raw egg, such as homemade raw cookie dough and homemade mayonnaise Simple steps for food safety [employer].  Do not buy food stored or displayed in an unclean area.  Do not buy bruised or damaged fruits or vegetables.  Do not buy cans that have cracks, dents, or bulges.  Pick up foods that can spoil at the end of your shopping trip and store them in a cooler on the way home. Prepare and clean up foods carefully.  Rinse all fresh fruits and vegetables under running water, and dry them with a clean towel or paper towel.  Clean the top of cans before opening them.  After preparing food, wash your hands for 20 seconds with hot water and soap. Pay special attention to areas between fingers and under nails.  Clean your utensils and dishes with hot water and soap.  Disinfect your kitchen and cutting boards using 1 teaspoon of liquid, unscented bleach  mixed into 1 quart of water.   Dispose of old food.  Eat canned and packaged food before its expiration date (the use by or best before date).  Consume refrigerated leftovers within 3 to 4 days. After that time, throw out the food. Even if the food does not smell or look spoiled, it still may be unsafe. Some bacteria, such as Listeria, can grow even on foods stored in the refrigerator if they are kept for too long. Take precautions when eating out.  At restaurants, avoid buffets and salad bars where food sits out for a long time and comes in contact with many people. Food can become contaminated when someone with a virus, often a norovirus, or another bug handles it.  Put any leftover food in a to-go container yourself, rather than having the server do it. And, refrigerate leftovers as soon as you get home.  Choose restaurants that are clean and that are willing to prepare your food as you order it cooked.   MEDICATIONS:                                                                                                                                                                Compazine/Prochlorperazine 10mg  tablet. Take 1 tablet every 6 hours as needed for nausea/vomiting. (This can make you sleepy)   EMLA cream. Apply a quarter size amount to port site 1 hour prior to chemo. Do not rub in. Cover with plastic wrap.   Over-the-Counter Meds:  Colace - 100 mg capsules - take 2 capsules daily.  If this doesn't help then you  can increase to 2 capsules twice daily.  Call us if this does not help your bowels move.   Imodium 2mg  capsule. Take 2 capsules after the 1st loose stool and then 1 capsule every 2 hours until you go a total of 12 hours without having a loose stool. Call the [facility] if loose stools continue. If diarrhea occurs at bedtime, take 2 capsules at bedtime. Then take 2 capsules every 4 hours until morning. Call [facility].    Diarrhea Sheet   If you are  having loose stools/diarrhea, please purchase Imodium and begin taking as outlined:  At the first sign of poorly formed or loose stools you should begin taking Imodium (loperamide) 2 mg capsules.  Take two caplets (4mg ) followed by one caplet (2mg ) every 2 hours until you have had no diarrhea for 12 hours.  During the night take two caplets (4mg ) at bedtime and continue every 4 hours during the night until the morning.  Stop taking Imodium only after there is no sign of diarrhea for 12 hours.    Always call the [facility] if you are having loose stools/diarrhea that you can't get under control.  Loose stools/diarrhea leads to dehydration (loss of water) in your body.  We have other options of trying to get the loose stools/diarrhea to stop but you must let us know!   Constipation Sheet  Colace - 100 mg capsules - take 2 capsules daily.  If this doesn't help then you can increase to 2 capsules twice daily.  Please call if the above does not work for you.   Do not go more than 2 days without a bowel movement.  It is very important that you do not become constipated.  It will make you feel sick to your stomach (nausea) and can cause abdominal pain and vomiting.   Nausea Sheet   Compazine/Prochlorperazine 10mg  tablet. Take 1 tablet every 6 hours as needed for nausea/vomiting. (This can make you sleepy)  If you are having persistent nausea (nausea that does not stop) please call the [facility] and let us know the amount of nausea that you are experiencing.  If you begin to vomit, you need to call the [facility] and if it is the weekend and you have vomited more than one time and can't get it to stop-go to the Emergency Room.  Persistent nausea/vomiting can lead to dehydration (loss of fluid in your body) and will make you feel terrible.   Ice chips, sips of clear liquids, foods that are @ room temperature, crackers, and toast tend to be better tolerated.   SYMPTOMS TO REPORT AS SOON AS  POSSIBLE AFTER TREATMENT:   FEVER GREATER THAN 100.5 F  CHILLS WITH OR WITHOUT FEVER  NAUSEA AND VOMITING THAT IS NOT CONTROLLED WITH YOUR NAUSEA MEDICATION  UNUSUAL SHORTNESS OF BREATH  UNUSUAL BRUISING OR BLEEDING  TENDERNESS IN MOUTH AND THROAT WITH OR WITHOUT PRESENCE OF ULCERS  URINARY PROBLEMS  BOWEL PROBLEMS  UNUSUAL RASH      Wear comfortable clothing and clothing appropriate for easy access to any Portacath or PICC line. Let us know if there is anything that we can do to make your therapy better!    What to do if you need assistance after hours or on the weekends: CALL [phone].  HOLD on the line, do not hang up.  You will hear multiple messages but at the end you will be connected with a nurse triage line.  They will contact  the doctor if necessary.  Most of the time they will be able to assist you.  Do not call the hospital operator.      I have been informed and understand all of the instructions given to me and have received a copy. I have been instructed to call the clinic [phone] or my family physician as soon as possible for continued medical care, if indicated. I do not have any more questions at this time but understand that I may call the [facility] or the Patient Navigator at [phone] during office hours should I have questions or need assistance in obtaining follow-up care.

## 2018-06-05 ENCOUNTER — Inpatient Hospital Stay (HOSPITAL_COMMUNITY): Payer: Medicaid Other

## 2018-06-05 ENCOUNTER — Encounter (HOSPITAL_COMMUNITY): Payer: Self-pay

## 2018-06-05 VITALS — BP 138/69 | HR 74 | Temp 98.0°F | Resp 18 | Wt 156.6 lb

## 2018-06-05 DIAGNOSIS — C50812 Malignant neoplasm of overlapping sites of left female breast: Secondary | ICD-10-CM

## 2018-06-05 DIAGNOSIS — Z5112 Encounter for antineoplastic immunotherapy: Secondary | ICD-10-CM | POA: Diagnosis not present

## 2018-06-05 DIAGNOSIS — Z171 Estrogen receptor negative status [ER-]: Principal | ICD-10-CM

## 2018-06-05 MED ORDER — SODIUM CHLORIDE 0.9 % IV SOLN
75.0000 mg/m2 | Freq: Once | INTRAVENOUS | Status: AC
  Administered 2018-06-05: 140 mg via INTRAVENOUS
  Filled 2018-06-05: qty 14

## 2018-06-05 MED ORDER — ACETAMINOPHEN 325 MG PO TABS
ORAL_TABLET | ORAL | Status: AC
  Filled 2018-06-05: qty 2

## 2018-06-05 MED ORDER — FAMOTIDINE IN NACL 20-0.9 MG/50ML-% IV SOLN
INTRAVENOUS | Status: AC
  Filled 2018-06-05: qty 50

## 2018-06-05 MED ORDER — FAMOTIDINE IN NACL 20-0.9 MG/50ML-% IV SOLN
20.0000 mg | Freq: Once | INTRAVENOUS | Status: AC
  Administered 2018-06-05: 20 mg via INTRAVENOUS

## 2018-06-05 MED ORDER — SODIUM CHLORIDE 0.9 % IV SOLN
Freq: Once | INTRAVENOUS | Status: AC
  Administered 2018-06-05: 09:00:00 via INTRAVENOUS

## 2018-06-05 MED ORDER — ACETAMINOPHEN 325 MG PO TABS
650.0000 mg | ORAL_TABLET | Freq: Once | ORAL | Status: AC
  Administered 2018-06-05: 650 mg via ORAL

## 2018-06-05 MED ORDER — TRASTUZUMAB CHEMO 150 MG IV SOLR
600.0000 mg | Freq: Once | INTRAVENOUS | Status: AC
  Administered 2018-06-05: 600 mg via INTRAVENOUS
  Filled 2018-06-05: qty 28.57

## 2018-06-05 MED ORDER — HEPARIN SOD (PORK) LOCK FLUSH 100 UNIT/ML IV SOLN
500.0000 [IU] | Freq: Once | INTRAVENOUS | Status: AC | PRN
  Administered 2018-06-05: 500 [IU]

## 2018-06-05 MED ORDER — SODIUM CHLORIDE 0.9 % IV SOLN
840.0000 mg | Freq: Once | INTRAVENOUS | Status: AC
  Administered 2018-06-05: 840 mg via INTRAVENOUS
  Filled 2018-06-05: qty 28

## 2018-06-05 MED ORDER — SODIUM CHLORIDE 0.9 % IV SOLN: 10.0000 mg | Freq: Once | INTRAVENOUS | Status: DC

## 2018-06-05 MED ORDER — DIPHENHYDRAMINE HCL 25 MG PO CAPS
50.0000 mg | ORAL_CAPSULE | Freq: Once | ORAL | Status: AC
  Administered 2018-06-05: 50 mg via ORAL

## 2018-06-05 MED ORDER — DIPHENHYDRAMINE HCL 25 MG PO CAPS
ORAL_CAPSULE | ORAL | Status: AC
  Filled 2018-06-05: qty 2

## 2018-06-05 MED ORDER — SODIUM CHLORIDE 0.9 % IV SOLN
Freq: Once | INTRAVENOUS | Status: AC
  Administered 2018-06-05: 09:00:00 via INTRAVENOUS
  Filled 2018-06-05: qty 4

## 2018-06-05 MED ORDER — SODIUM CHLORIDE 0.9% FLUSH
10.0000 mL | INTRAVENOUS | Status: DC | PRN
  Administered 2018-06-05: 10 mL
  Filled 2018-06-05: qty 10

## 2018-06-05 NOTE — Patient Instructions (Signed)
[facility] Discharge Instructions for Patients Receiving Chemotherapy  Today you received the following chemotherapy agents   To help prevent nausea and vomiting after your treatment, we encourage you to take your nausea medication   If you develop nausea and vomiting that is not controlled by your nausea medication, call the clinic.   BELOW ARE SYMPTOMS THAT SHOULD BE REPORTED IMMEDIATELY:  *FEVER GREATER THAN 100.5 F  *CHILLS WITH OR WITHOUT FEVER  NAUSEA AND VOMITING THAT IS NOT CONTROLLED WITH YOUR NAUSEA MEDICATION  *UNUSUAL SHORTNESS OF BREATH  *UNUSUAL BRUISING OR BLEEDING  TENDERNESS IN MOUTH AND THROAT WITH OR WITHOUT PRESENCE OF ULCERS  *URINARY PROBLEMS  *BOWEL PROBLEMS  UNUSUAL RASH Items with * indicate a potential emergency and should be followed up as soon as possible.  Feel free to call the clinic should you have any questions or concerns. The clinic phone number is [phone].  Please show the CHEMO ALERT CARD at check-in to the Emergency Department and triage nurse.   

## 2018-06-05 NOTE — Progress Notes (Signed)
Treatment given today per MD orders. Tolerated infusion without adverse affects. Vital signs stable. No complaints at this time. Discharged from clinic ambulatory. F/U with [facility] as scheduled.   

## 2018-06-07 ENCOUNTER — Inpatient Hospital Stay (HOSPITAL_COMMUNITY): Payer: Medicaid Other | Attending: Internal Medicine

## 2018-06-07 VITALS — BP 119/48 | HR 89 | Temp 98.9°F | Resp 18

## 2018-06-07 DIAGNOSIS — C50412 Malignant neoplasm of upper-outer quadrant of left female breast: Secondary | ICD-10-CM | POA: Insufficient documentation

## 2018-06-07 DIAGNOSIS — Z171 Estrogen receptor negative status [ER-]: Secondary | ICD-10-CM

## 2018-06-07 DIAGNOSIS — C773 Secondary and unspecified malignant neoplasm of axilla and upper limb lymph nodes: Secondary | ICD-10-CM | POA: Diagnosis not present

## 2018-06-07 DIAGNOSIS — C50812 Malignant neoplasm of overlapping sites of left female breast: Secondary | ICD-10-CM

## 2018-06-07 DIAGNOSIS — C7802 Secondary malignant neoplasm of left lung: Secondary | ICD-10-CM | POA: Diagnosis not present

## 2018-06-07 DIAGNOSIS — Z5189 Encounter for other specified aftercare: Secondary | ICD-10-CM | POA: Insufficient documentation

## 2018-06-07 MED ORDER — PEGFILGRASTIM-CBQV 6 MG/0.6ML ~~LOC~~ SOSY
PREFILLED_SYRINGE | SUBCUTANEOUS | Status: AC
  Filled 2018-06-07: qty 0.6

## 2018-06-07 MED ORDER — PEGFILGRASTIM-CBQV 6 MG/0.6ML ~~LOC~~ SOSY
6.0000 mg | PREFILLED_SYRINGE | Freq: Once | SUBCUTANEOUS | Status: AC
  Administered 2018-06-07: 6 mg via SUBCUTANEOUS

## 2018-06-07 NOTE — Patient Instructions (Signed)
[facility]  Discharge Instructions:   _______________________________________________________________  Thank you for choosing [facility] at [facility] to provide your oncology and hematology care.  To afford each patient quality time with our providers, please arrive at least 15 minutes before your scheduled appointment.  You need to re-schedule your appointment if you arrive 10 or more minutes late.  We strive to give you quality time with our providers, and arriving late affects you and other patients whose appointments are after yours.  Also, if you no show three or more times for appointments you may be dismissed from the clinic.  Again, thank you for choosing [facility] at [facility]. Our hope is that these requests will allow you access to exceptional care and in a timely manner. _______________________________________________________________  If you have questions after your visit, please contact our office at [phone] between the hours of 8:30 a.m. and 5:00 p.m. Voicemails left after 4:30 p.m. will not be returned until the following business day. _______________________________________________________________  For prescription refill requests, have your pharmacy contact our office. _______________________________________________________________  Recommendations made by the consultant and any test results will be sent to your referring physician. _______________________________________________________________ 

## 2018-06-07 NOTE — Progress Notes (Signed)
Pt presents to the [facility] for Udenyca injection. Pt ambulated with no assist. No complaints at this time. VSS. Pt tolerated well. Ambulated off the unit. F/U as scheduled.

## 2018-06-16 ENCOUNTER — Ambulatory Visit (HOSPITAL_COMMUNITY): Payer: Medicaid Other | Admitting: Hematology

## 2018-06-19 ENCOUNTER — Ambulatory Visit (HOSPITAL_COMMUNITY): Payer: Medicaid Other | Admitting: Internal Medicine

## 2018-06-19 ENCOUNTER — Ambulatory Visit (HOSPITAL_COMMUNITY): Payer: Medicaid Other | Admitting: Hematology

## 2018-06-19 ENCOUNTER — Ambulatory Visit (HOSPITAL_COMMUNITY): Payer: Medicaid Other

## 2018-06-19 ENCOUNTER — Other Ambulatory Visit (HOSPITAL_COMMUNITY): Payer: Medicaid Other

## 2018-06-20 ENCOUNTER — Ambulatory Visit (HOSPITAL_COMMUNITY): Payer: Medicaid Other | Admitting: Internal Medicine

## 2018-06-21 ENCOUNTER — Ambulatory Visit (HOSPITAL_COMMUNITY): Payer: Medicaid Other

## 2018-06-26 ENCOUNTER — Inpatient Hospital Stay (HOSPITAL_BASED_OUTPATIENT_CLINIC_OR_DEPARTMENT_OTHER): Payer: Medicaid Other | Admitting: Internal Medicine

## 2018-06-26 ENCOUNTER — Encounter (HOSPITAL_COMMUNITY): Payer: Self-pay | Admitting: Internal Medicine

## 2018-06-26 ENCOUNTER — Inpatient Hospital Stay (HOSPITAL_COMMUNITY): Payer: Medicaid Other | Attending: Nurse Practitioner

## 2018-06-26 ENCOUNTER — Inpatient Hospital Stay (HOSPITAL_COMMUNITY): Payer: Medicaid Other

## 2018-06-26 VITALS — BP 112/52 | HR 70 | Temp 98.6°F | Resp 16 | Wt 153.9 lb

## 2018-06-26 VITALS — BP 131/58 | HR 73 | Temp 98.0°F | Resp 18

## 2018-06-26 DIAGNOSIS — C50412 Malignant neoplasm of upper-outer quadrant of left female breast: Secondary | ICD-10-CM | POA: Diagnosis present

## 2018-06-26 DIAGNOSIS — C7801 Secondary malignant neoplasm of right lung: Secondary | ICD-10-CM | POA: Diagnosis not present

## 2018-06-26 DIAGNOSIS — Z5189 Encounter for other specified aftercare: Secondary | ICD-10-CM | POA: Diagnosis not present

## 2018-06-26 DIAGNOSIS — Z5111 Encounter for antineoplastic chemotherapy: Secondary | ICD-10-CM | POA: Insufficient documentation

## 2018-06-26 DIAGNOSIS — F0390 Unspecified dementia without behavioral disturbance: Secondary | ICD-10-CM

## 2018-06-26 DIAGNOSIS — Z171 Estrogen receptor negative status [ER-]: Secondary | ICD-10-CM

## 2018-06-26 DIAGNOSIS — C50812 Malignant neoplasm of overlapping sites of left female breast: Secondary | ICD-10-CM

## 2018-06-26 DIAGNOSIS — Z5112 Encounter for antineoplastic immunotherapy: Secondary | ICD-10-CM | POA: Insufficient documentation

## 2018-06-26 DIAGNOSIS — C773 Secondary and unspecified malignant neoplasm of axilla and upper limb lymph nodes: Secondary | ICD-10-CM | POA: Diagnosis not present

## 2018-06-26 DIAGNOSIS — C50912 Malignant neoplasm of unspecified site of left female breast: Secondary | ICD-10-CM

## 2018-06-26 LAB — COMPREHENSIVE METABOLIC PANEL
ALK PHOS: 73 U/L (ref 38–126)
ALT: 10 U/L (ref 0–44)
ANION GAP: 10 (ref 5–15)
AST: 14 U/L — ABNORMAL LOW (ref 15–41)
Albumin: 3.5 g/dL (ref 3.5–5.0)
BILIRUBIN TOTAL: 0.5 mg/dL (ref 0.3–1.2)
BUN: 9 mg/dL (ref 8–23)
CALCIUM: 9.3 mg/dL (ref 8.9–10.3)
CO2: 28 mmol/L (ref 22–32)
Chloride: 101 mmol/L (ref 98–111)
Creatinine, Ser: 0.56 mg/dL (ref 0.44–1.00)
GFR calc non Af Amer: >60 mL/min (ref >60)
Glucose, Bld: 105 mg/dL — ABNORMAL HIGH (ref 70–99)
POTASSIUM: 3.9 mmol/L (ref 3.5–5.1)
SODIUM: 139 mmol/L (ref 135–145)
TOTAL PROTEIN: 7.1 g/dL (ref 6.5–8.1)

## 2018-06-26 LAB — CBC WITH DIFFERENTIAL/PLATELET
Abs Immature Granulocytes: 0.04 10*3/uL (ref 0.00–0.07)
BASOS ABS: 0 10*3/uL (ref 0.0–0.1)
Basophils Relative: 1 %
EOS PCT: 0 %
Eosinophils Absolute: 0 10*3/uL (ref 0.0–0.5)
HEMATOCRIT: 39.7 % (ref 36.0–46.0)
Hemoglobin: 12.4 g/dL (ref 12.0–15.0)
Immature Granulocytes: 1 %
Lymphocytes Relative: 14 %
Lymphs Abs: 1.2 10*3/uL (ref 0.7–4.0)
MCH: 29 pg (ref 26.0–34.0)
MCHC: 31.2 g/dL (ref 30.0–36.0)
MCV: 92.8 fL (ref 80.0–100.0)
MONO ABS: 0.6 10*3/uL (ref 0.1–1.0)
Monocytes Relative: 7 %
NRBC: 0 % (ref 0.0–0.2)
Neutro Abs: 6.5 10*3/uL (ref 1.7–7.7)
Neutrophils Relative %: 77 %
Platelets: 521 10*3/uL — ABNORMAL HIGH (ref 150–400)
RBC: 4.28 MIL/uL (ref 3.87–5.11)
RDW: 13.7 % (ref 11.5–15.5)
WBC: 8.4 10*3/uL (ref 4.0–10.5)

## 2018-06-26 MED ORDER — LIDOCAINE-PRILOCAINE 2.5-2.5 % EX CREA: TOPICAL_CREAM | CUTANEOUS | 3 refills | Status: DC

## 2018-06-26 MED ORDER — FAMOTIDINE IN NACL 20-0.9 MG/50ML-% IV SOLN
20.0000 mg | Freq: Once | INTRAVENOUS | Status: AC
  Administered 2018-06-26: 20 mg via INTRAVENOUS
  Filled 2018-06-26: qty 50

## 2018-06-26 MED ORDER — DIPHENHYDRAMINE HCL 25 MG PO CAPS
50.0000 mg | ORAL_CAPSULE | Freq: Once | ORAL | Status: AC
  Administered 2018-06-26: 50 mg via ORAL
  Filled 2018-06-26: qty 2

## 2018-06-26 MED ORDER — SODIUM CHLORIDE 0.9 % IV SOLN
Freq: Once | INTRAVENOUS | Status: AC
  Administered 2018-06-26: 10:00:00 via INTRAVENOUS

## 2018-06-26 MED ORDER — SODIUM CHLORIDE 0.9 % IV SOLN
420.0000 mg | Freq: Once | INTRAVENOUS | Status: AC
  Administered 2018-06-26: 420 mg via INTRAVENOUS
  Filled 2018-06-26: qty 14

## 2018-06-26 MED ORDER — SODIUM CHLORIDE 0.9% FLUSH
10.0000 mL | INTRAVENOUS | Status: DC | PRN
  Administered 2018-06-26: 10 mL
  Filled 2018-06-26: qty 10

## 2018-06-26 MED ORDER — ACETAMINOPHEN 325 MG PO TABS
650.0000 mg | ORAL_TABLET | Freq: Once | ORAL | Status: AC
  Administered 2018-06-26: 650 mg via ORAL
  Filled 2018-06-26: qty 2

## 2018-06-26 MED ORDER — HEPARIN SOD (PORK) LOCK FLUSH 100 UNIT/ML IV SOLN
500.0000 [IU] | Freq: Once | INTRAVENOUS | Status: AC | PRN
  Administered 2018-06-26: 500 [IU]
  Filled 2018-06-26: qty 5

## 2018-06-26 MED ORDER — TRASTUZUMAB CHEMO 150 MG IV SOLR
450.0000 mg | Freq: Once | INTRAVENOUS | Status: AC
  Administered 2018-06-26: 450 mg via INTRAVENOUS
  Filled 2018-06-26: qty 21.43

## 2018-06-26 MED ORDER — SODIUM CHLORIDE 0.9 % IV SOLN
75.0000 mg/m2 | Freq: Once | INTRAVENOUS | Status: AC
  Administered 2018-06-26: 140 mg via INTRAVENOUS
  Filled 2018-06-26: qty 14

## 2018-06-26 MED ORDER — SODIUM CHLORIDE 0.9 % IV SOLN
Freq: Once | INTRAVENOUS | Status: AC
  Administered 2018-06-26: 11:00:00 via INTRAVENOUS
  Filled 2018-06-26: qty 4

## 2018-06-26 NOTE — Patient Instructions (Addendum)
[facility] Discharge Instructions for Patients Receiving Chemotherapy   Beginning January 23rd 2017 lab work for the [facility] will be done in the  Main lab at [facility] on 1st floor. If you have a lab appointment with the [facility] please come in thru the  Main Entrance and check in at the main information desk   Today you received the following chemotherapy agents Taxotere,Herceptin and Perjeta. Follow-up as scheduled. Call clinic for any questions or concerns  To help prevent nausea and vomiting after your treatment, we encourage you to take your nausea medication   If you develop nausea and vomiting, or diarrhea that is not controlled by your medication, call the clinic.  The clinic phone number is [phone]. Office hours are Monday-Friday 8:30am-5:00pm.  BELOW ARE SYMPTOMS THAT SHOULD BE REPORTED IMMEDIATELY:  *FEVER GREATER THAN 101.0 F  *CHILLS WITH OR WITHOUT FEVER  NAUSEA AND VOMITING THAT IS NOT CONTROLLED WITH YOUR NAUSEA MEDICATION  *UNUSUAL SHORTNESS OF BREATH  *UNUSUAL BRUISING OR BLEEDING  TENDERNESS IN MOUTH AND THROAT WITH OR WITHOUT PRESENCE OF ULCERS  *URINARY PROBLEMS  *BOWEL PROBLEMS  UNUSUAL RASH Items with * indicate a potential emergency and should be followed up as soon as possible. If you have an emergency after office hours please contact your primary care physician or go to the nearest emergency department.  Please call the clinic during office hours if you have any questions or concerns.   You may also contact the Patient Navigator at [phone] should you have any questions or need assistance in obtaining follow up care.      Resources For Cancer Patients and their Caregivers ? American Cancer Society: Can assist with transportation, wigs, general needs, runs Look Good Feel Better.        657-579-4939 ? Cancer Care: Provides financial assistance, online support groups, medication/co-pay assistance.   1-800-813-HOPE 651-797-8918) ? [facility] Assists [place] cancer patients and their families through emotional , educational and financial support.  606-248-2566 ? [place] DSS Where to apply for food stamps, Medicaid and utility assistance. 520 516 5216 ? [facility]: Transportation to medical appointments. 2702538907 ? Social Security Administration: May apply for disability if have a Stage IV cancer. 2531369384 531-770-4759 ? [employer], Disability and Transit Services: Assists with nutrition, care and transit needs. 720-162-5177

## 2018-06-26 NOTE — Progress Notes (Signed)
1000 Labs reviewed with and pt seen by Dr. Walden Field and pt approved for chemo tx today per MD                                                                           [name] [name] tolerated chemo tx well without complaints or incident. VSS upon discharge. Pt discharged self ambulatory in satisfactory condition with transportation from [facility] notified

## 2018-06-26 NOTE — Progress Notes (Signed)
Diagnosis Malignant neoplasm of overlapping sites of left breast in female, estrogen receptor negative ([place]) - Plan: CBC with Differential/Platelet, Comprehensive metabolic panel, Lactate dehydrogenase, DISCONTINUED: 0.9 %  sodium chloride infusion, DISCONTINUED: sodium chloride flush (NS) 0.9 % injection 10 mL, DISCONTINUED: heparin lock flush 100 unit/mL, DISCONTINUED: famotidine (PEPCID) IVPB 20 mg premix, DISCONTINUED: ondansetron (ZOFRAN) 8 mg, dexamethasone (DECADRON) 4 mg in sodium chloride 0.9 % 50 mL IVPB, DISCONTINUED: acetaminophen (TYLENOL) tablet 650 mg, DISCONTINUED: diphenhydrAMINE (BENADRYL) capsule 50 mg, DISCONTINUED: trastuzumab (HERCEPTIN) 462 mg in sodium chloride 0.9 % 250 mL chemo infusion, DISCONTINUED: pertuzumab (PERJETA) 420 mg in sodium chloride 0.9 % 250 mL chemo infusion, DISCONTINUED: DOCEtaxel (TAXOTERE) 140 mg in sodium chloride 0.9 % 250 mL chemo infusion  Staging Cancer Staging No matching staging information was found for the patient.  Assessment and Plan:  1.  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 - PET/CT scan on 05/11/2018 shows bilateral hypermetabolic pulmonary nodules consistent with pulmonary metastasis with no metastatic disease outside of the thorax. - On 05/23/2018, lung biopsy shows poorly differentiated metastatic carcinoma, consistent with breast primary. - Echocardiogram on 05/03/2018 shows ejection fraction of 60 to 65%.  - Patient is a resident at [facility] in [place] for the last few years secondary to dementia.  Her healthcare proxy was her Sister Neoma Laming who lives at [employer].  Pt is being treated for advanced breast cancer  in the palliative setting. - Pt is seen today for evaluation prior to C2 of Taxotere dosed at 75  mg/m2, Herceptin 6 mg/kg and Perjeta 420 mg IV every 3 weeks for 6 cycles.  PT should be set up for repeat imaging after 6 cycles.  Mass in left breast is improved on evaluation today.  Sister understands goal of therapy and has given consent to treat.    -If she does not tolerate chemotherapy, we will discontinue it and continue HER-2 directed therapy.  Labs done 06/26/2018 reviewed and showed WBC 8.4 HB 12.4 plts 521,000.  Chemistries WNL with K+ 3.9 Cr 0.56 and normal LFTs.  Pt will proceed with therapy today.  She will RTC in 3 weeks for follow-up and labs prior to C3 of THP.   Will consider testing for hereditary breast cancer syndromes.  She has 2 young daughters.  2.  Dementia.  Sister reports this affected several family members.  Pt is alert and conversant.  She is currently in [facility].  Sister is Marine scientist.   3.  Health maintenance.  Pt was not getting mammograms since being in facility.  She is not sure when last colonoscopy was done.    25 minutes spent with more than 50% spent in counseling and coordination of care.    Interval history:  Historical data obtained from note dated 05/16/2018.   62 year old female referred for evaluation due to breast cancer.  She is accompanied by nursing home worker.  Patient reportedly has been in nursing facility since September 2018.  She reportedly was diagnosed with dementia which reportedly runs in her family.  Power of attorney is available via phone.  The patient reportedly noted an area in the left breast and brought that to the facility's attention.       She subsequently had bilateral diagnostic mammogram done on 04/04/2018 that showed  IMPRESSION: 1. Highly suspicious left breast mass with associated calcifications measuring 4.6 cm sonographically.  2. Suspicious lymph node in the left axilla with focal nodular cortical thickening.  3.  No findings of malignancy in the right breast.  RECOMMENDATION: 1. Recommend  ultrasound-guided biopsy of the palpable mass in the left breast at the 1 to 2 o'clock position.  2. Recommend ultrasound-guided biopsy of the abnormal lymph node in the left axilla.     Patient underwent ultrasound-guided biopsy of the left breast on 04/11/2018.  Clip was placed. Pathology returned as : ADDITIONAL INFORMATION: 1. Immunohistochemical stain for E-cadherin is positive, consistent with a ductal phenotype. Jaquita Folds MD Pathologist, Electronic Signature ( Signed 04/14/2018) 1. PROGNOSTIC INDICATORS Results: IMMUNOHISTOCHEMICAL AND MORPHOMETRIC ANALYSIS PERFORMED MANUALLY The tumor cells are Positive for Her2 (3+). Estrogen Receptor: 0%, NEGATIVE Progesterone Receptor: 0%, NEGATIVE Proliferation Marker Ki67: 50% REFERENCE RANGE ESTROGEN RECEPTOR NEGATIVE 0% POSITIVE =>1% REFERENCE RANGE PROGESTERONE RECEPTOR NEGATIVE 0% POSITIVE =>1% All controls stained appropriately Vicente Males MD Pathologist, Electronic Signature ( Signed 04/14/2018) FINAL DIAGNOSIS 1 of 3 FINAL for [place], [name] ([id number]) Diagnosis 1. Breast, left, needle core biopsy, 2:00 - INVASIVE MAMMARY CARCINOMA. SEE NOTE. 2. Lymph node, needle/core biopsy, left axilla - INVOLVED BY MAMMARY CARCINOMA   Current Status:  Pt is seen today for follow-up prior to C2 of THP.  She reports she is tolerating therapy.      Malignant neoplasm of overlapping sites of left female breast ([place])   05/15/2018 Initial Diagnosis    Malignant neoplasm of overlapping sites of left female breast ([place])    05/30/2018 -  Chemotherapy    The patient had pegfilgrastim-cbqv (UDENYCA) injection 6 mg, 6 mg, Subcutaneous, Once, 2 of 6 cycles Administration: 6 mg (06/07/2018) trastuzumab (HERCEPTIN) 600 mg in sodium chloride 0.9 % 250 mL chemo infusion, 609 mg, Intravenous,  Once, 2 of 6 cycles Administration: 600 mg (06/05/2018) DOCEtaxel (TAXOTERE) 140 mg in sodium chloride 0.9 % 250 mL chemo infusion, 75 mg/m2 = 140  mg, Intravenous,  Once, 2 of 6 cycles Administration: 140 mg (06/05/2018) ondansetron (ZOFRAN) 8 mg, dexamethasone (DECADRON) 4 mg in sodium chloride 0.9 % 50 mL IVPB, , Intravenous,  Once, 2 of 6 cycles Administration:  (06/05/2018) pertuzumab (PERJETA) 840 mg in sodium chloride 0.9 % 250 mL chemo infusion, 840 mg, Intravenous, Once, 2 of 6 cycles Administration: 840 mg (06/05/2018)  for chemotherapy treatment.       Problem List Patient Active Problem List   Diagnosis Date Noted  . Goals of care, counseling/discussion [Z71.89] 05/30/2018  . HER2-positive carcinoma of left breast ([place]) [C50.912] 05/30/2018  . Malignant neoplasm of overlapping sites of left female breast ([place]) [C50.812]     Past Medical History Past Medical History:  Diagnosis Date  . Alzheimer's dementia ([place])   . Bradycardia   . Breast cancer ([place])   . Dementia ([place])   . Depression   . Hypokalemia   . Hypotension   . 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 ABDOMINAL HYSTERECTOMY    . BREAST BIOPSY Left   . PORTACATH PLACEMENT Right 05/15/2018   Procedure: INSERTION PORT-A-CATH;  Surgeon: Aviva Signs, MD;  Location: [facility];  Service: General;  Laterality: Right;    Family History History reviewed. No pertinent family history.   Social History  has an unknown smoking status. She has never used smokeless tobacco. She reports that  she drank alcohol. She reports that she does not use drugs.  Medications  Current Outpatient Medications:  .  docusate sodium (COLACE) 100 MG capsule, Take 100 mg by mouth 2 (two) times daily., Disp: , Rfl:  .  ondansetron (ZOFRAN) 4 MG tablet, Take 4 mg by mouth every 8 (eight) hours as needed for nausea or vomiting., Disp: , Rfl:  .  ALPRAZolam (XANAX) 0.25 MG tablet, Take 0.25 mg by mouth every 6 (six) hours as needed for anxiety., Disp: , Rfl:  .   cholecalciferol (VITAMIN D) 1000 units tablet, Take 1,000 Units by mouth daily., Disp: , Rfl:  .  divalproex (DEPAKOTE) 250 MG DR tablet, Take 250 mg by mouth at bedtime., Disp: , Rfl:  .  divalproex (DEPAKOTE) 500 MG DR tablet, Take 500 mg by mouth daily. , Disp: , Rfl:  .  DOCEtaxel (TAXOTERE IV), Inject into the vein., Disp: , Rfl:  .  donepezil (ARICEPT) 10 MG tablet, Take 10 mg by mouth at bedtime., Disp: , Rfl:  .  lidocaine-prilocaine (EMLA) cream, Apply to affected area once, Disp: 30 g, Rfl: 3 .  memantine (NAMENDA) 10 MG tablet, Take 10 mg by mouth 2 (two) times daily., Disp: , Rfl:  .  pegfilgrastim-cbqv (UDENYCA) 6 MG/0.6ML injection, Inject 6 mg into the skin once., Disp: , Rfl:  .  Pertuzumab (PERJETA IV), Inject into the vein., Disp: , Rfl:  .  prochlorperazine (COMPAZINE) 10 MG tablet, Take 1 tablet (10 mg total) by mouth every 6 (six) hours as needed (Nausea or vomiting)., Disp: 30 tablet, Rfl: 1 .  QUEtiapine (SEROQUEL) 25 MG tablet, Take 12.5 mg by mouth 3 (three) times daily., Disp: , Rfl:  .  sertraline (ZOLOFT) 100 MG tablet, Take 100 mg by mouth daily., Disp: , Rfl:  .  Trastuzumab (HERCEPTIN IV), Inject into the vein., Disp: , Rfl:  No current facility-administered medications for this visit.   Facility-Administered Medications Ordered in Other Visits:  .  DOCEtaxel (TAXOTERE) 140 mg in sodium chloride 0.9 % 250 mL chemo infusion, 75 mg/m2 (Treatment Plan Recorded), Intravenous, Once, Albi Rappaport, MD .  heparin lock flush 100 unit/mL, 500 Units, Intracatheter, Once PRN, Emery Binz, Mathis Dad, MD .  pertuzumab (PERJETA) 420 mg in sodium chloride 0.9 % 250 mL chemo infusion, 420 mg, Intravenous, Once, Liese Dizdarevic, MD, Last Rate: 528 mL/hr at 06/26/18 1231, 420 mg at 06/26/18 1231 .  sodium chloride flush (NS) 0.9 % injection 10 mL, 10 mL, Intracatheter, PRN, Rosaly Labarbera, MD, 10 mL at 06/26/18 1009  Allergies Patient has no known allergies.  Review of Systems Review of Systems  - Oncology ROS negative   Physical Exam  Vitals Wt Readings from Last 3 Encounters:  06/26/18 153 lb 14.4 oz (69.8 kg)  06/05/18 156 lb 9.6 oz (71 kg)  05/23/18 170 lb (77.1 kg)   Temp Readings from Last 3 Encounters:  06/26/18 98.6 F (37 C) (Oral)  06/07/18 98.9 F (37.2 C) (Oral)  06/05/18 98 F (36.7 C)   BP Readings from Last 3 Encounters:  06/26/18 (!) 112/52  06/07/18 (!) 119/48  06/05/18 138/69   Pulse Readings from Last 3 Encounters:  06/26/18 70  06/07/18 89  06/05/18 74   Constitutional: Well-developed, well-nourished, and in no distress.   HENT: Head: Normocephalic and atraumatic.  Mouth/Throat: No oropharyngeal exudate. Mucosa moist. Eyes: Pupils are equal, round, and reactive to light. Conjunctivae are normal. No scleral icterus.  Neck: Normal range of motion. Neck supple. No JVD  present.  Cardiovascular: Normal rate, regular rhythm and normal heart sounds.  Exam reveals no gallop and no friction rub.   No murmur heard. Pulmonary/Chest: Effort normal and breath sounds normal. No respiratory distress. No wheezes.No rales.  Abdominal: Soft. Bowel sounds are normal. No distension. There is no tenderness. There is no guarding.  Musculoskeletal: No edema or tenderness.  Lymphadenopathy: No cervical, axillary or supraclavicular adenopathy.  Neurological: Alert and oriented to person, place, and time. No cranial nerve deficit.  Skin: Skin is warm and dry. No rash noted. No erythema. No pallor.  Psychiatric: Affect and judgment normal.  Breast exam:  Chaperone present.  Left breast mass at 1:00 position measures 2-3 cm today on exam.  Right breast shows no palpable dominant masses.    Labs Appointment on 06/26/2018  Component Date Value Ref Range Status  . WBC 06/26/2018 8.4  4.0 - 10.5 K/uL Final  . RBC 06/26/2018 4.28  3.87 - 5.11 MIL/uL Final  . Hemoglobin 06/26/2018 12.4  12.0 - 15.0 g/dL Final  . HCT 06/26/2018 39.7  36.0 - 46.0 % Final  . MCV  06/26/2018 92.8  80.0 - 100.0 fL Final  . MCH 06/26/2018 29.0  26.0 - 34.0 pg Final  . MCHC 06/26/2018 31.2  30.0 - 36.0 g/dL Final  . RDW 06/26/2018 13.7  11.5 - 15.5 % Final  . Platelets 06/26/2018 521* 150 - 400 K/uL Final  . nRBC 06/26/2018 0.0  0.0 - 0.2 % Final  . Neutrophils Relative % 06/26/2018 77  % Final  . Neutro Abs 06/26/2018 6.5  1.7 - 7.7 K/uL Final  . Lymphocytes Relative 06/26/2018 14  % Final  . Lymphs Abs 06/26/2018 1.2  0.7 - 4.0 K/uL Final  . Monocytes Relative 06/26/2018 7  % Final  . Monocytes Absolute 06/26/2018 0.6  0.1 - 1.0 K/uL Final  . Eosinophils Relative 06/26/2018 0  % Final  . Eosinophils Absolute 06/26/2018 0.0  0.0 - 0.5 K/uL Final  . Basophils Relative 06/26/2018 1  % Final  . Basophils Absolute 06/26/2018 0.0  0.0 - 0.1 K/uL Final  . Immature Granulocytes 06/26/2018 1  % Final  . Abs Immature Granulocytes 06/26/2018 0.04  0.00 - 0.07 K/uL Final   Performed at [facility], [address], [place], [place] [address]  . Sodium 06/26/2018 139  135 - 145 mmol/L Final  . Potassium 06/26/2018 3.9  3.5 - 5.1 mmol/L Final  . Chloride 06/26/2018 101  98 - 111 mmol/L Final  . CO2 06/26/2018 28  22 - 32 mmol/L Final  . Glucose, Bld 06/26/2018 105* 70 - 99 mg/dL Final  . BUN 06/26/2018 9  8 - 23 mg/dL Final  . Creatinine, Ser 06/26/2018 0.56  0.44 - 1.00 mg/dL Final  . Calcium 06/26/2018 9.3  8.9 - 10.3 mg/dL Final  . Total Protein 06/26/2018 7.1  6.5 - 8.1 g/dL Final  . Albumin 06/26/2018 3.5  3.5 - 5.0 g/dL Final  . AST 06/26/2018 14* 15 - 41 U/L Final  . ALT 06/26/2018 10  0 - 44 U/L Final  . Alkaline Phosphatase 06/26/2018 73  38 - 126 U/L Final  . Total Bilirubin 06/26/2018 0.5  0.3 - 1.2 mg/dL Final  . GFR calc non Af Amer 06/26/2018 >60  >60 mL/min Final  . GFR calc Af Amer 06/26/2018 >60  >60 mL/min Final   Comment: (NOTE) The eGFR has been calculated using the CKD EPI equation. This calculation has not been validated in all clinical  situations. eGFR's persistently <60 mL/min signify possible Chronic Kidney Disease.   Georgiann Hahn gap 06/26/2018 10  5 - 15 Final   Performed at [facility], [address] [place], [place] [address]     Pathology Orders Placed This Encounter  Procedures  . CBC with Differential/Platelet    Standing Status:   Future    Standing Expiration Date:   06/27/2019  . Comprehensive metabolic panel    Standing Status:   Future    Standing Expiration Date:   06/27/2019  . Lactate dehydrogenase    Standing Status:   Future    Standing Expiration Date:   06/27/2019       Zoila Shutter MD

## 2018-06-27 LAB — CANCER ANTIGEN 27.29: CAN 27.29: 32 U/mL (ref 0.0–38.6)

## 2018-06-27 LAB — CANCER ANTIGEN 15-3: CA 15-3: 33.8 U/mL — ABNORMAL HIGH (ref 0.0–25.0)

## 2018-06-28 ENCOUNTER — Inpatient Hospital Stay (HOSPITAL_COMMUNITY): Payer: Medicaid Other

## 2018-06-28 VITALS — BP 121/52 | HR 78 | Temp 98.5°F | Resp 18

## 2018-06-28 DIAGNOSIS — Z5111 Encounter for antineoplastic chemotherapy: Secondary | ICD-10-CM | POA: Diagnosis not present

## 2018-06-28 DIAGNOSIS — Z171 Estrogen receptor negative status [ER-]: Principal | ICD-10-CM

## 2018-06-28 DIAGNOSIS — C50812 Malignant neoplasm of overlapping sites of left female breast: Secondary | ICD-10-CM

## 2018-06-28 MED ORDER — PEGFILGRASTIM-CBQV 6 MG/0.6ML ~~LOC~~ SOSY
PREFILLED_SYRINGE | SUBCUTANEOUS | Status: AC
  Filled 2018-06-28: qty 0.6

## 2018-06-28 MED ORDER — PEGFILGRASTIM-CBQV 6 MG/0.6ML ~~LOC~~ SOSY
6.0000 mg | PREFILLED_SYRINGE | Freq: Once | SUBCUTANEOUS | Status: AC
  Administered 2018-06-28: 6 mg via SUBCUTANEOUS

## 2018-06-28 NOTE — Progress Notes (Signed)
[name] [name] tolerated Udenyca injection without incident or complaint. VSS. Pt given copy of schedule to take back to [facility]. Pt escorted down to main entrance waiting for transport from [facility].

## 2018-06-28 NOTE — Patient Instructions (Signed)
[facility] _______________________________________________________________  Thank you for choosing [facility] at [facility] to provide your oncology and hematology care.  To afford each patient quality time with our providers, please arrive at least 15 minutes before your scheduled appointment.  You need to re-schedule your appointment if you arrive 10 or more minutes late.  We strive to give you quality time with our providers, and arriving late affects you and other patients whose appointments are after yours.  Also, if you no show three or more times for appointments you may be dismissed from the clinic.  Again, thank you for choosing [facility] at [facility]. Our hope is that these requests will allow you access to exceptional care and in a timely manner. _______________________________________________________________  If you have questions after your visit, please contact our office at [phone] between the hours of 8:30 a.m. and 5:00 p.m. Voicemails left after 4:30 p.m. will not be returned until the following business day. _______________________________________________________________  For prescription refill requests, have your pharmacy contact our office. _______________________________________________________________  Recommendations made by the consultant and any test results will be sent to your referring physician. _______________________________________________________________ 

## 2018-07-03 ENCOUNTER — Telehealth (HOSPITAL_COMMUNITY): Payer: Self-pay | Admitting: Surgery

## 2018-07-03 NOTE — Telephone Encounter (Signed)
Caryl Pina from [facility] called on behalf of [name] [name], stating pt is having difficulty with her eyes and that multiple antibiotic drops have been used.  Caryl Pina wanted to know if these problems were related to her treatment at the cancer center and if Dr. Walden Field had any suggestions.  Per Dr. Walden Field, pt needs to have an optometrist appointment set up.

## 2018-07-17 ENCOUNTER — Encounter (HOSPITAL_COMMUNITY): Payer: Self-pay

## 2018-07-17 ENCOUNTER — Inpatient Hospital Stay (HOSPITAL_BASED_OUTPATIENT_CLINIC_OR_DEPARTMENT_OTHER): Payer: Medicaid Other | Admitting: Internal Medicine

## 2018-07-17 ENCOUNTER — Inpatient Hospital Stay (HOSPITAL_COMMUNITY): Payer: Medicaid Other

## 2018-07-17 ENCOUNTER — Other Ambulatory Visit: Payer: Self-pay

## 2018-07-17 VITALS — BP 125/65 | HR 72 | Temp 98.6°F | Resp 16

## 2018-07-17 DIAGNOSIS — C7801 Secondary malignant neoplasm of right lung: Secondary | ICD-10-CM

## 2018-07-17 DIAGNOSIS — C50412 Malignant neoplasm of upper-outer quadrant of left female breast: Secondary | ICD-10-CM

## 2018-07-17 DIAGNOSIS — R110 Nausea: Secondary | ICD-10-CM | POA: Diagnosis not present

## 2018-07-17 DIAGNOSIS — C50912 Malignant neoplasm of unspecified site of left female breast: Secondary | ICD-10-CM

## 2018-07-17 DIAGNOSIS — C773 Secondary and unspecified malignant neoplasm of axilla and upper limb lymph nodes: Secondary | ICD-10-CM

## 2018-07-17 DIAGNOSIS — C50812 Malignant neoplasm of overlapping sites of left female breast: Secondary | ICD-10-CM

## 2018-07-17 DIAGNOSIS — Z5111 Encounter for antineoplastic chemotherapy: Secondary | ICD-10-CM | POA: Diagnosis not present

## 2018-07-17 DIAGNOSIS — Z171 Estrogen receptor negative status [ER-]: Secondary | ICD-10-CM

## 2018-07-17 DIAGNOSIS — F0390 Unspecified dementia without behavioral disturbance: Secondary | ICD-10-CM

## 2018-07-17 LAB — CBC WITH DIFFERENTIAL/PLATELET
Abs Immature Granulocytes: 0.02 10*3/uL (ref 0.00–0.07)
BASOS ABS: 0 10*3/uL (ref 0.0–0.1)
BASOS PCT: 1 %
EOS ABS: 0 10*3/uL (ref 0.0–0.5)
EOS PCT: 0 %
HEMATOCRIT: 37.5 % (ref 36.0–46.0)
Hemoglobin: 11.3 g/dL — ABNORMAL LOW (ref 12.0–15.0)
Immature Granulocytes: 0 %
Lymphocytes Relative: 15 %
Lymphs Abs: 1 10*3/uL (ref 0.7–4.0)
MCH: 28.6 pg (ref 26.0–34.0)
MCHC: 30.1 g/dL (ref 30.0–36.0)
MCV: 94.9 fL (ref 80.0–100.0)
MONOS PCT: 9 %
Monocytes Absolute: 0.6 10*3/uL (ref 0.1–1.0)
NEUTROS PCT: 75 %
NRBC: 0 % (ref 0.0–0.2)
Neutro Abs: 5 10*3/uL (ref 1.7–7.7)
PLATELETS: 388 10*3/uL (ref 150–400)
RBC: 3.95 MIL/uL (ref 3.87–5.11)
RDW: 16.2 % — AB (ref 11.5–15.5)
WBC: 6.6 10*3/uL (ref 4.0–10.5)

## 2018-07-17 LAB — COMPREHENSIVE METABOLIC PANEL
ALT: 8 U/L (ref 0–44)
ANION GAP: 8 (ref 5–15)
AST: 13 U/L — ABNORMAL LOW (ref 15–41)
Albumin: 3.5 g/dL (ref 3.5–5.0)
Alkaline Phosphatase: 64 U/L (ref 38–126)
BILIRUBIN TOTAL: 0.3 mg/dL (ref 0.3–1.2)
BUN: 10 mg/dL (ref 8–23)
CHLORIDE: 105 mmol/L (ref 98–111)
CO2: 27 mmol/L (ref 22–32)
Calcium: 8.9 mg/dL (ref 8.9–10.3)
Creatinine, Ser: 0.5 mg/dL (ref 0.44–1.00)
GFR calc Af Amer: >60 mL/min (ref >60)
Glucose, Bld: 123 mg/dL — ABNORMAL HIGH (ref 70–99)
POTASSIUM: 4.3 mmol/L (ref 3.5–5.1)
Sodium: 140 mmol/L (ref 135–145)
Total Protein: 7.4 g/dL (ref 6.5–8.1)

## 2018-07-17 LAB — LACTATE DEHYDROGENASE: LDH: 129 U/L (ref 98–192)

## 2018-07-17 MED ORDER — HEPARIN SOD (PORK) LOCK FLUSH 100 UNIT/ML IV SOLN
500.0000 [IU] | Freq: Once | INTRAVENOUS | Status: AC | PRN
  Administered 2018-07-17: 500 [IU]

## 2018-07-17 MED ORDER — SODIUM CHLORIDE 0.9 % IV SOLN
420.0000 mg | Freq: Once | INTRAVENOUS | Status: AC
  Administered 2018-07-17: 420 mg via INTRAVENOUS
  Filled 2018-07-17: qty 14

## 2018-07-17 MED ORDER — HEPARIN SOD (PORK) LOCK FLUSH 100 UNIT/ML IV SOLN
INTRAVENOUS | Status: AC
  Filled 2018-07-17: qty 5

## 2018-07-17 MED ORDER — TRASTUZUMAB CHEMO 150 MG IV SOLR
450.0000 mg | Freq: Once | INTRAVENOUS | Status: AC
  Administered 2018-07-17: 450 mg via INTRAVENOUS
  Filled 2018-07-17: qty 21.43

## 2018-07-17 MED ORDER — SODIUM CHLORIDE 0.9 % IV SOLN
Freq: Once | INTRAVENOUS | Status: AC
  Administered 2018-07-17: 09:00:00 via INTRAVENOUS

## 2018-07-17 MED ORDER — DIPHENHYDRAMINE HCL 25 MG PO CAPS
50.0000 mg | ORAL_CAPSULE | Freq: Once | ORAL | Status: AC
  Administered 2018-07-17: 50 mg via ORAL
  Filled 2018-07-17: qty 2

## 2018-07-17 MED ORDER — FAMOTIDINE IN NACL 20-0.9 MG/50ML-% IV SOLN
20.0000 mg | Freq: Once | INTRAVENOUS | Status: AC
  Administered 2018-07-17: 20 mg via INTRAVENOUS
  Filled 2018-07-17: qty 50

## 2018-07-17 MED ORDER — SODIUM CHLORIDE 0.9 % IV SOLN
Freq: Once | INTRAVENOUS | Status: AC
  Administered 2018-07-17: 10:00:00 via INTRAVENOUS
  Filled 2018-07-17: qty 4

## 2018-07-17 MED ORDER — ACETAMINOPHEN 325 MG PO TABS
650.0000 mg | ORAL_TABLET | Freq: Once | ORAL | Status: AC
  Administered 2018-07-17: 650 mg via ORAL
  Filled 2018-07-17: qty 2

## 2018-07-17 MED ORDER — SODIUM CHLORIDE 0.9% FLUSH
10.0000 mL | INTRAVENOUS | Status: DC | PRN
  Administered 2018-07-17: 10 mL
  Filled 2018-07-17: qty 10

## 2018-07-17 MED ORDER — SODIUM CHLORIDE 0.9 % IV SOLN
75.0000 mg/m2 | Freq: Once | INTRAVENOUS | Status: AC
  Administered 2018-07-17: 140 mg via INTRAVENOUS
  Filled 2018-07-17: qty 14

## 2018-07-17 NOTE — Patient Instructions (Addendum)
[facility] Discharge Instructions for Patients Receiving Chemotherapy   Beginning January 23rd 2017 lab work for the [facility] will be done in the  Main lab at [facility] on 1st floor. If you have a lab appointment with the [place] please come in thru the  Main Entrance and check in at the main information desk   Today you received the following chemotherapy agents taxotere, herceptin, perjeta  To help prevent nausea and vomiting after your treatment, we encourage you to take your nausea medication     If you develop nausea and vomiting, or diarrhea that is not controlled by your medication, call the clinic.  The clinic phone number is [phone]. Office hours are Monday-Friday 8:30am-5:00pm.  BELOW ARE SYMPTOMS THAT SHOULD BE REPORTED IMMEDIATELY:  *FEVER GREATER THAN 101.0 F  *CHILLS WITH OR WITHOUT FEVER  NAUSEA AND VOMITING THAT IS NOT CONTROLLED WITH YOUR NAUSEA MEDICATION  *UNUSUAL SHORTNESS OF BREATH  *UNUSUAL BRUISING OR BLEEDING  TENDERNESS IN MOUTH AND THROAT WITH OR WITHOUT PRESENCE OF ULCERS  *URINARY PROBLEMS  *BOWEL PROBLEMS  UNUSUAL RASH Items with * indicate a potential emergency and should be followed up as soon as possible. If you have an emergency after office hours please contact your primary care physician or go to the nearest emergency department.  Please call the clinic during office hours if you have any questions or concerns.   You may also contact the Patient Navigator at [phone] should you have any questions or need assistance in obtaining follow up care.      Resources For Cancer Patients and their Caregivers ? American Cancer Society: Can assist with transportation, wigs, general needs, runs Look Good Feel Better.        854-584-9973 ? Cancer Care: Provides financial assistance, online support groups, medication/co-pay assistance.  1-800-813-HOPE (212) 553-9167) ? [place] Assists [place] cancer patients and their families through emotional , educational and financial support.  (442)426-1661 ? [place] DSS Where to apply for food stamps, Medicaid and utility assistance. 517-756-8601 ? [facility]: Transportation to medical appointments. 331-136-5434 ? Social Security Administration: May apply for disability if have a Stage IV cancer. (225)193-4452 (918)240-7872 ? [employer], Disability and Transit Services: Assists with nutrition, care and transit needs. [place] Discharge Instructions for Patients Receiving Chemotherapy  Today you received the following chemotherapy agents   To help prevent nausea and vomiting after your treatment, we encourage you to take your nausea medication   If you develop nausea and vomiting that is not controlled by your nausea medication, call the clinic.   BELOW ARE SYMPTOMS THAT SHOULD BE REPORTED IMMEDIATELY: *FEVER GREATER THAN 100.5 F *CHILLS WITH OR WITHOUT FEVER NAUSEA AND VOMITING THAT IS NOT CONTROLLED WITH YOUR NAUSEA MEDICATION *UNUSUAL SHORTNESS OF BREATH *UNUSUAL BRUISING OR BLEEDING TENDERNESS IN MOUTH AND THROAT WITH OR WITHOUT PRESENCE OF ULCERS *URINARY PROBLEMS *BOWEL PROBLEMS UNUSUAL RASH Items with * indicate a potential emergency and should be followed up as soon as possible.  Feel free to call the clinic should you have any questions or concerns. The clinic phone number is [phone].  Please show the [place] at check-in to the Emergency Department and triage nurse.

## 2018-07-17 NOTE — Progress Notes (Signed)
Diagnosis Malignant neoplasm of overlapping sites of left breast in female, estrogen receptor negative ([place]) - Plan: CBC with Differential/Platelet, Comprehensive metabolic panel, Lactate dehydrogenase, DISCONTINUED: 0.9 %  sodium chloride infusion, DISCONTINUED: sodium chloride flush (NS) 0.9 % injection 10 mL, DISCONTINUED: heparin lock flush 100 unit/mL, DISCONTINUED: famotidine (PEPCID) IVPB 20 mg premix, DISCONTINUED: ondansetron (ZOFRAN) 8 mg, dexamethasone (DECADRON) 4 mg in sodium chloride 0.9 % 50 mL IVPB, DISCONTINUED: acetaminophen (TYLENOL) tablet 650 mg, DISCONTINUED: diphenhydrAMINE (BENADRYL) capsule 50 mg, DISCONTINUED: trastuzumab (HERCEPTIN) 462 mg in sodium chloride 0.9 % 250 mL chemo infusion, DISCONTINUED: pertuzumab (PERJETA) 420 mg in sodium chloride 0.9 % 250 mL chemo infusion, DISCONTINUED: DOCEtaxel (TAXOTERE) 140 mg in sodium chloride 0.9 % 250 mL chemo infusion  Staging Cancer Staging No matching staging information was found for the patient.  Assessment and Plan:   1.  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 - PET/CT scan on 05/11/2018 shows bilateral hypermetabolic pulmonary nodules consistent with pulmonary metastasis with no metastatic disease outside of the thorax. - On 05/23/2018, lung biopsy shows poorly differentiated metastatic carcinoma, consistent with breast primary. - Echocardiogram on 05/03/2018 shows ejection fraction of 60 to 65%.  - Patient is a resident at [facility] in [place] for the last few years secondary to dementia.  Her healthcare proxy was her Sister Neoma Laming who lives at [employer].  Pt is being treated for advanced breast cancer  in the palliative setting. - Pt is seen today for evaluation prior to C3 of Taxotere dosed at 75  mg/m2, Herceptin 6 mg/kg and Perjeta 420 mg IV every 3 weeks for 6 cycles.  PT should be set up for repeat imaging after 6 cycles.  Mass in left breast is improved on evaluation today.  Sister understands goal of therapy and has given consent to treat.    Labs done 07/17/2018 reviewed and showed WBC 6.6 HB 11.3 plts 388,000.  Chemistries WNL with K+ 4.3 Cr 0.5 and normal LFTs.  Will set pt up for repeat ECHO in 07/2018 prior to C4.  She will be seen in 3 weeks for follow-up prior to C4 of THP.     Option of genetic testing as she has 2 young daughters.  2.  Dementia.  Sister reports this affected several family members.  Pt is alert and conversant.  She is currently in [facility].  Sister is Marine scientist.   3.  Nausea.  Will instruct nursing facility regarding nausea regimen.    4.  Health maintenance.  Pt was not getting mammograms since being in facility.  She is not sure when last colonoscopy was done.    25 minutes spent with more than 50% spent in counseling and coordination of care.    Interval history:  Historical data obtained from note dated 05/16/2018.   62 year old female referred for evaluation due to breast cancer.  She is accompanied by nursing home worker.  Patient reportedly has been in nursing facility since September 2018.  She reportedly was diagnosed with dementia which reportedly runs in her family.  Power of attorney is available via phone.  The patient reportedly noted an area in the left breast and brought that to the facility's attention.       She subsequently had bilateral diagnostic mammogram done on 04/04/2018 that  showed  IMPRESSION: 1. Highly suspicious left breast mass with associated calcifications measuring 4.6 cm sonographically.  2. Suspicious lymph node in the left axilla with focal nodular cortical thickening.  3.  No findings of malignancy in the right breast.  RECOMMENDATION: 1. Recommend ultrasound-guided biopsy of the palpable mass in  the left breast at the 1 to 2 o'clock position.  2. Recommend ultrasound-guided biopsy of the abnormal lymph node in the left axilla.     Patient underwent ultrasound-guided biopsy of the left breast on 04/11/2018.  Clip was placed. Pathology returned as : ADDITIONAL INFORMATION: 1. Immunohistochemical stain for E-cadherin is positive, consistent with a ductal phenotype. Jaquita Folds MD Pathologist, Electronic Signature ( Signed 04/14/2018) 1. PROGNOSTIC INDICATORS Results: IMMUNOHISTOCHEMICAL AND MORPHOMETRIC ANALYSIS PERFORMED MANUALLY The tumor cells are Positive for Her2 (3+). Estrogen Receptor: 0%, NEGATIVE Progesterone Receptor: 0%, NEGATIVE Proliferation Marker Ki67: 50% REFERENCE RANGE ESTROGEN RECEPTOR NEGATIVE 0% POSITIVE =>1% REFERENCE RANGE PROGESTERONE RECEPTOR NEGATIVE 0% POSITIVE =>1% All controls stained appropriately Vicente Males MD Pathologist, Electronic Signature ( Signed 04/14/2018) FINAL DIAGNOSIS 1 of 3 FINAL for [place], Beyza ([id number]) Diagnosis 1. Breast, left, needle core biopsy, 2:00 - INVASIVE MAMMARY CARCINOMA. SEE NOTE. 2. Lymph node, needle/core biopsy, left axilla - INVOLVED BY MAMMARY CARCINOMA   Current Status:  Pt is seen today for follow-up prior to C3 of THP.  She reports mild nausea.      Malignant neoplasm of overlapping sites of left female breast ([place])   05/15/2018 Initial Diagnosis    Malignant neoplasm of overlapping sites of left female breast ([place])    05/30/2018 -  Chemotherapy    The patient had pegfilgrastim-cbqv (UDENYCA) injection 6 mg, 6 mg, Subcutaneous, Once, 3 of 6 cycles Administration: 6 mg (06/07/2018), 6 mg (06/28/2018) trastuzumab (HERCEPTIN) 600 mg in sodium chloride 0.9 % 250 mL chemo infusion, 609 mg, Intravenous,  Once, 3 of 6 cycles Administration: 600 mg (06/05/2018), 450 mg (06/26/2018) DOCEtaxel (TAXOTERE) 140 mg in sodium chloride 0.9 % 250 mL chemo infusion, 75 mg/m2 = 140 mg, Intravenous,  Once, 3  of 6 cycles Administration: 140 mg (06/05/2018), 140 mg (06/26/2018) ondansetron (ZOFRAN) 8 mg, dexamethasone (DECADRON) 4 mg in sodium chloride 0.9 % 50 mL IVPB, , Intravenous,  Once, 3 of 6 cycles Administration:  (06/05/2018),  (06/26/2018) pertuzumab (PERJETA) 840 mg in sodium chloride 0.9 % 250 mL chemo infusion, 840 mg, Intravenous, Once, 3 of 6 cycles Administration: 840 mg (06/05/2018), 420 mg (06/26/2018)  for chemotherapy treatment.       Problem List Patient Active Problem List   Diagnosis Date Noted  . Goals of care, counseling/discussion [Z71.89] 05/30/2018  . HER2-positive carcinoma of left breast ([place]) [C50.912] 05/30/2018  . Malignant neoplasm of overlapping sites of left female breast ([place]) [C50.812]     Past Medical History Past Medical History:  Diagnosis Date  . Alzheimer's dementia ([place])   . Bradycardia   . Breast cancer ([place])   . Dementia ([place])   . Depression   . Hypokalemia   . Hypotension   . 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 ABDOMINAL HYSTERECTOMY    . BREAST BIOPSY Left   . PORTACATH PLACEMENT Right 05/15/2018   Procedure: INSERTION PORT-A-CATH;  Surgeon: Aviva Signs, MD;  Location: [facility];  Service: General;  Laterality: Right;    Family History No family history on file.   Social History  has  an unknown smoking status. She has never used smokeless tobacco. She reports that she drank alcohol. She reports that she does not use drugs.  Medications  Current Outpatient Medications:  .  ALPRAZolam (XANAX) 0.25 MG tablet, Take 0.25 mg by mouth every 6 (six) hours as needed for anxiety., Disp: , Rfl:  .  cholecalciferol (VITAMIN D) 1000 units tablet, Take 1,000 Units by mouth daily., Disp: , Rfl:  .  divalproex (DEPAKOTE) 250 MG DR tablet, Take 250 mg by mouth at bedtime., Disp: , Rfl:  .  divalproex (DEPAKOTE) 500 MG DR tablet,  Take 500 mg by mouth daily. , Disp: , Rfl:  .  DOCEtaxel (TAXOTERE IV), Inject into the vein., Disp: , Rfl:  .  docusate sodium (COLACE) 100 MG capsule, Take 100 mg by mouth 2 (two) times daily., Disp: , Rfl:  .  donepezil (ARICEPT) 10 MG tablet, Take 10 mg by mouth at bedtime., Disp: , Rfl:  .  lidocaine-prilocaine (EMLA) cream, Apply to affected area once, Disp: 30 g, Rfl: 3 .  memantine (NAMENDA) 10 MG tablet, Take 10 mg by mouth 2 (two) times daily., Disp: , Rfl:  .  ondansetron (ZOFRAN) 4 MG tablet, Take 4 mg by mouth every 8 (eight) hours as needed for nausea or vomiting., Disp: , Rfl:  .  pegfilgrastim-cbqv (UDENYCA) 6 MG/0.6ML injection, Inject 6 mg into the skin once., Disp: , Rfl:  .  Pertuzumab (PERJETA IV), Inject into the vein., Disp: , Rfl:  .  prochlorperazine (COMPAZINE) 10 MG tablet, Take 1 tablet (10 mg total) by mouth every 6 (six) hours as needed (Nausea or vomiting)., Disp: 30 tablet, Rfl: 1 .  QUEtiapine (SEROQUEL) 25 MG tablet, Take 12.5 mg by mouth 3 (three) times daily., Disp: , Rfl:  .  sertraline (ZOLOFT) 100 MG tablet, Take 100 mg by mouth daily., Disp: , Rfl:  .  Trastuzumab (HERCEPTIN IV), Inject into the vein., Disp: , Rfl:  No current facility-administered medications for this visit.   Facility-Administered Medications Ordered in Other Visits:  .  acetaminophen (TYLENOL) tablet 650 mg, 650 mg, Oral, Once, Rishan Oyama, MD .  diphenhydrAMINE (BENADRYL) capsule 50 mg, 50 mg, Oral, Once, Secret Kristensen, MD .  DOCEtaxel (TAXOTERE) 140 mg in sodium chloride 0.9 % 250 mL chemo infusion, 75 mg/m2 (Treatment Plan Recorded), Intravenous, Once, Pegi Milazzo, MD .  famotidine (PEPCID) IVPB 20 mg premix, 20 mg, Intravenous, Once, Carollynn Pennywell, MD .  ondansetron (ZOFRAN) 8 mg, dexamethasone (DECADRON) 4 mg in sodium chloride 0.9 % 50 mL IVPB, , Intravenous, Once, Denys Salinger, Mathis Dad, MD .  pertuzumab (PERJETA) 420 mg in sodium chloride 0.9 % 250 mL chemo infusion, 420 mg, Intravenous,  Once, Dyani Babel, MD .  sodium chloride flush (NS) 0.9 % injection 10 mL, 10 mL, Intracatheter, PRN, Kilani Joffe, MD, 10 mL at 07/17/18 0929 .  trastuzumab (HERCEPTIN) 450 mg in sodium chloride 0.9 % 250 mL chemo infusion, 450 mg, Intravenous, Once, Kriste Broman, Mathis Dad, MD  Allergies Patient has no known allergies.  Review of Systems Review of Systems - Oncology ROS negative other than mild nausea   Physical Exam  Vitals Wt Readings from Last 3 Encounters:  07/17/18 155 lb (70.3 kg)  06/26/18 153 lb 14.4 oz (69.8 kg)  06/05/18 156 lb 9.6 oz (71 kg)   Temp Readings from Last 3 Encounters:  07/17/18 98.5 F (36.9 C) (Oral)  06/28/18 98.5 F (36.9 C) (Oral)  06/26/18 98.6 F (37 C) (Oral)   BP Readings  from Last 3 Encounters:  07/17/18 97/63  06/28/18 (!) 121/52  06/26/18 (!) 112/52   Pulse Readings from Last 3 Encounters:  07/17/18 78  06/28/18 78  06/26/18 70    Constitutional: Well-developed, well-nourished, and in no distress.   HENT: Head: Normocephalic and atraumatic.  Mouth/Throat: No oropharyngeal exudate. Mucosa moist. Eyes: Pupils are equal, round, and reactive to light. Conjunctivae are normal. No scleral icterus.  Neck: Normal range of motion. Neck supple. No JVD present.  Cardiovascular: Normal rate, regular rhythm and normal heart sounds.  Exam reveals no gallop and no friction rub.   No murmur heard. Pulmonary/Chest: Effort normal and breath sounds normal. No respiratory distress. No wheezes.No rales.  Abdominal: Soft. Bowel sounds are normal. No distension. There is no tenderness. There is no guarding.  Musculoskeletal: No edema or tenderness.  Lymphadenopathy: No cervical, axillary or supraclavicular adenopathy.  Neurological: Alert and oriented to person, place, and time. No cranial nerve deficit.  Skin: Skin is warm and dry. No rash noted. No erythema. No pallor.  Psychiatric: Affect and judgment normal.  Breast exam:  Chaperone present.  Mass in left  breast improved measures approx 2 cm.  No dominant masses palpable in the right breast.    Labs Lab on 07/17/2018  Component Date Value Ref Range Status  . WBC 07/17/2018 6.6  4.0 - 10.5 K/uL Final  . RBC 07/17/2018 3.95  3.87 - 5.11 MIL/uL Final  . Hemoglobin 07/17/2018 11.3* 12.0 - 15.0 g/dL Final  . HCT 07/17/2018 37.5  36.0 - 46.0 % Final  . MCV 07/17/2018 94.9  80.0 - 100.0 fL Final  . MCH 07/17/2018 28.6  26.0 - 34.0 pg Final  . MCHC 07/17/2018 30.1  30.0 - 36.0 g/dL Final  . RDW 07/17/2018 16.2* 11.5 - 15.5 % Final  . Platelets 07/17/2018 388  150 - 400 K/uL Final  . nRBC 07/17/2018 0.0  0.0 - 0.2 % Final  . Neutrophils Relative % 07/17/2018 75  % Final  . Neutro Abs 07/17/2018 5.0  1.7 - 7.7 K/uL Final  . Lymphocytes Relative 07/17/2018 15  % Final  . Lymphs Abs 07/17/2018 1.0  0.7 - 4.0 K/uL Final  . Monocytes Relative 07/17/2018 9  % Final  . Monocytes Absolute 07/17/2018 0.6  0.1 - 1.0 K/uL Final  . Eosinophils Relative 07/17/2018 0  % Final  . Eosinophils Absolute 07/17/2018 0.0  0.0 - 0.5 K/uL Final  . Basophils Relative 07/17/2018 1  % Final  . Basophils Absolute 07/17/2018 0.0  0.0 - 0.1 K/uL Final  . Immature Granulocytes 07/17/2018 0  % Final  . Abs Immature Granulocytes 07/17/2018 0.02  0.00 - 0.07 K/uL Final   Performed at [facility], [address], [place], [place] [address]  . Sodium 07/17/2018 140  135 - 145 mmol/L Final  . Potassium 07/17/2018 4.3  3.5 - 5.1 mmol/L Final  . Chloride 07/17/2018 105  98 - 111 mmol/L Final  . CO2 07/17/2018 27  22 - 32 mmol/L Final  . Glucose, Bld 07/17/2018 123* 70 - 99 mg/dL Final  . BUN 07/17/2018 10  8 - 23 mg/dL Final  . Creatinine, Ser 07/17/2018 0.50  0.44 - 1.00 mg/dL Final  . Calcium 07/17/2018 8.9  8.9 - 10.3 mg/dL Final  . Total Protein 07/17/2018 7.4  6.5 - 8.1 g/dL Final  . Albumin 07/17/2018 3.5  3.5 - 5.0 g/dL Final  . AST 07/17/2018 13* 15 - 41 U/L Final  . ALT 07/17/2018 8  0 - 44 U/L Final  . Alkaline  Phosphatase 07/17/2018 64  38 - 126 U/L Final  . Total Bilirubin 07/17/2018 0.3  0.3 - 1.2 mg/dL Final  . GFR calc non Af Amer 07/17/2018 >60  >60 mL/min Final  . GFR calc Af Amer 07/17/2018 >60  >60 mL/min Final   Comment: (NOTE) The eGFR has been calculated using the CKD EPI equation. This calculation has not been validated in all clinical situations. eGFR's persistently <60 mL/min signify possible Chronic Kidney Disease.   Georgiann Hahn gap 07/17/2018 8  5 - 15 Final   Performed at [facility], [address] [place], [place] [address]  . LDH 07/17/2018 129  98 - 192 U/L Final   Performed at [facility], [address] [place], [place] [address]     Pathology Orders Placed This Encounter  Procedures  . CBC with Differential/Platelet    Standing Status:   Future    Standing Expiration Date:   07/18/2019  . Comprehensive metabolic panel    Standing Status:   Future    Standing Expiration Date:   07/18/2019  . Lactate dehydrogenase    Standing Status:   Future    Standing Expiration Date:   07/18/2019       Zoila Shutter MD

## 2018-07-17 NOTE — Progress Notes (Unsigned)
Dr. Walden Field at the bedside. Labs reviewed. Proceed with treatment per MD.

## 2018-07-17 NOTE — Progress Notes (Signed)
Pt here today for Taxotere Day1 Cycle 3. VSS. Pt has no complaints of any changes since last visit. Pt is taking Robitussin for slight cough per patient.   Treatment given today per MD orders. Tolerated infusion without adverse affects. Vital signs stable. No complaints at this time. Discharged from clinic ambulatory. F/U with [facility] as scheduled.

## 2018-07-19 ENCOUNTER — Inpatient Hospital Stay (HOSPITAL_COMMUNITY): Payer: Medicaid Other

## 2018-07-19 ENCOUNTER — Encounter (HOSPITAL_COMMUNITY): Payer: Self-pay

## 2018-07-19 VITALS — BP 107/44 | HR 70 | Temp 98.6°F | Resp 16

## 2018-07-19 DIAGNOSIS — C50812 Malignant neoplasm of overlapping sites of left female breast: Secondary | ICD-10-CM

## 2018-07-19 DIAGNOSIS — Z5111 Encounter for antineoplastic chemotherapy: Secondary | ICD-10-CM | POA: Diagnosis not present

## 2018-07-19 DIAGNOSIS — Z171 Estrogen receptor negative status [ER-]: Principal | ICD-10-CM

## 2018-07-19 MED ORDER — PEGFILGRASTIM-CBQV 6 MG/0.6ML ~~LOC~~ SOSY
PREFILLED_SYRINGE | SUBCUTANEOUS | Status: AC
  Filled 2018-07-19: qty 0.6

## 2018-07-19 MED ORDER — PEGFILGRASTIM-CBQV 6 MG/0.6ML ~~LOC~~ SOSY
6.0000 mg | PREFILLED_SYRINGE | Freq: Once | SUBCUTANEOUS | Status: AC
  Administered 2018-07-19: 6 mg via SUBCUTANEOUS

## 2018-07-19 NOTE — Patient Instructions (Signed)
[facility] at [facility]  Discharge Instructions:   _______________________________________________________________  Thank you for choosing [facility] at [facility] to provide your oncology and hematology care.  To afford each patient quality time with our providers, please arrive at least 15 minutes before your scheduled appointment.  You need to re-schedule your appointment if you arrive 10 or more minutes late.  We strive to give you quality time with our providers, and arriving late affects you and other patients whose appointments are after yours.  Also, if you no show three or more times for appointments you may be dismissed from the clinic.  Again, thank you for choosing [facility] at [facility]. Our hope is that these requests will allow you access to exceptional care and in a timely manner. _______________________________________________________________  If you have questions after your visit, please contact our office at [phone] between the hours of 8:30 a.m. and 5:00 p.m. Voicemails left after 4:30 p.m. will not be returned until the following business day. _______________________________________________________________  For prescription refill requests, have your pharmacy contact our office. _______________________________________________________________  Recommendations made by the consultant and any test results will be sent to your referring physician. _______________________________________________________________ 

## 2018-07-19 NOTE — Progress Notes (Signed)
Patient tolerated injection with no complaints voiced.  Site clean and dry with no bruising or swelling noted at site.  Band aid applied.  VSS with discharge and left ambulatory with no s/s of distress noted.    

## 2018-07-25 ENCOUNTER — Ambulatory Visit (HOSPITAL_COMMUNITY)
Admission: RE | Admit: 2018-07-25 | Discharge: 2018-07-25 | Disposition: A | Discharge status: Home / Self Care | Payer: Medicaid Other | Source: Ambulatory Visit | Attending: Internal Medicine | Admitting: Internal Medicine

## 2018-07-25 DIAGNOSIS — C50912 Malignant neoplasm of unspecified site of left female breast: Secondary | ICD-10-CM | POA: Diagnosis present

## 2018-07-25 NOTE — Progress Notes (Signed)
*  PRELIMINARY RESULTS* Echocardiogram 2D Echocardiogram has been performed.  Samuel Germany 07/25/2018, 10:45 AM

## 2018-08-07 ENCOUNTER — Inpatient Hospital Stay (HOSPITAL_COMMUNITY): Payer: Medicaid Other | Attending: Hematology

## 2018-08-07 ENCOUNTER — Other Ambulatory Visit: Payer: Self-pay

## 2018-08-07 ENCOUNTER — Inpatient Hospital Stay (HOSPITAL_BASED_OUTPATIENT_CLINIC_OR_DEPARTMENT_OTHER): Payer: Medicaid Other | Admitting: Internal Medicine

## 2018-08-07 ENCOUNTER — Encounter (HOSPITAL_COMMUNITY): Payer: Self-pay | Admitting: Internal Medicine

## 2018-08-07 ENCOUNTER — Inpatient Hospital Stay (HOSPITAL_COMMUNITY): Payer: Medicaid Other

## 2018-08-07 VITALS — BP 123/65 | HR 65 | Temp 97.5°F

## 2018-08-07 VITALS — BP 100/52 | HR 69 | Temp 98.4°F | Resp 14 | Wt 152.3 lb

## 2018-08-07 DIAGNOSIS — C50812 Malignant neoplasm of overlapping sites of left female breast: Secondary | ICD-10-CM | POA: Insufficient documentation

## 2018-08-07 DIAGNOSIS — C773 Secondary and unspecified malignant neoplasm of axilla and upper limb lymph nodes: Secondary | ICD-10-CM | POA: Insufficient documentation

## 2018-08-07 DIAGNOSIS — F0390 Unspecified dementia without behavioral disturbance: Secondary | ICD-10-CM | POA: Diagnosis not present

## 2018-08-07 DIAGNOSIS — L03116 Cellulitis of left lower limb: Secondary | ICD-10-CM

## 2018-08-07 DIAGNOSIS — Z5112 Encounter for antineoplastic immunotherapy: Secondary | ICD-10-CM | POA: Diagnosis present

## 2018-08-07 DIAGNOSIS — C7801 Secondary malignant neoplasm of right lung: Secondary | ICD-10-CM

## 2018-08-07 DIAGNOSIS — Z171 Estrogen receptor negative status [ER-]: Secondary | ICD-10-CM

## 2018-08-07 DIAGNOSIS — R110 Nausea: Secondary | ICD-10-CM

## 2018-08-07 DIAGNOSIS — L03115 Cellulitis of right lower limb: Secondary | ICD-10-CM

## 2018-08-07 DIAGNOSIS — L03319 Cellulitis of trunk, unspecified: Secondary | ICD-10-CM

## 2018-08-07 DIAGNOSIS — Z5111 Encounter for antineoplastic chemotherapy: Secondary | ICD-10-CM | POA: Insufficient documentation

## 2018-08-07 LAB — COMPREHENSIVE METABOLIC PANEL
ALK PHOS: 73 U/L (ref 38–126)
ALT: 11 U/L (ref 0–44)
AST: 15 U/L (ref 15–41)
Albumin: 3.9 g/dL (ref 3.5–5.0)
Anion gap: 9 (ref 5–15)
BILIRUBIN TOTAL: 0.5 mg/dL (ref 0.3–1.2)
BUN: 12 mg/dL (ref 8–23)
CALCIUM: 9.5 mg/dL (ref 8.9–10.3)
CO2: 28 mmol/L (ref 22–32)
CREATININE: 0.48 mg/dL (ref 0.44–1.00)
Chloride: 103 mmol/L (ref 98–111)
Glucose, Bld: 82 mg/dL (ref 70–99)
Potassium: 4.4 mmol/L (ref 3.5–5.1)
Sodium: 140 mmol/L (ref 135–145)
TOTAL PROTEIN: 7.7 g/dL (ref 6.5–8.1)

## 2018-08-07 LAB — CBC WITH DIFFERENTIAL/PLATELET
ABS IMMATURE GRANULOCYTES: 0.05 10*3/uL (ref 0.00–0.07)
Basophils Absolute: 0 10*3/uL (ref 0.0–0.1)
Basophils Relative: 0 %
Eosinophils Absolute: 0 10*3/uL (ref 0.0–0.5)
Eosinophils Relative: 0 %
HEMATOCRIT: 39.7 % (ref 36.0–46.0)
HEMOGLOBIN: 12.3 g/dL (ref 12.0–15.0)
IMMATURE GRANULOCYTES: 1 %
LYMPHS ABS: 1.8 10*3/uL (ref 0.7–4.0)
Lymphocytes Relative: 17 %
MCH: 29.9 pg (ref 26.0–34.0)
MCHC: 31 g/dL (ref 30.0–36.0)
MCV: 96.6 fL (ref 80.0–100.0)
MONO ABS: 0.8 10*3/uL (ref 0.1–1.0)
Monocytes Relative: 8 %
NEUTROS ABS: 7.8 10*3/uL — AB (ref 1.7–7.7)
NEUTROS PCT: 74 %
Platelets: 390 10*3/uL (ref 150–400)
RBC: 4.11 MIL/uL (ref 3.87–5.11)
RDW: 17.6 % — ABNORMAL HIGH (ref 11.5–15.5)
WBC: 10.5 10*3/uL (ref 4.0–10.5)
nRBC: 0 % (ref 0.0–0.2)

## 2018-08-07 LAB — LACTATE DEHYDROGENASE: LDH: 142 U/L (ref 98–192)

## 2018-08-07 MED ORDER — TRASTUZUMAB CHEMO 150 MG IV SOLR
450.0000 mg | Freq: Once | INTRAVENOUS | Status: AC
  Administered 2018-08-07: 450 mg via INTRAVENOUS
  Filled 2018-08-07: qty 21.43

## 2018-08-07 MED ORDER — DIPHENHYDRAMINE HCL 25 MG PO CAPS
50.0000 mg | ORAL_CAPSULE | Freq: Once | ORAL | Status: AC
  Administered 2018-08-07: 50 mg via ORAL
  Filled 2018-08-07: qty 2

## 2018-08-07 MED ORDER — ACETAMINOPHEN 325 MG PO TABS
650.0000 mg | ORAL_TABLET | Freq: Once | ORAL | Status: AC
  Administered 2018-08-07: 650 mg via ORAL
  Filled 2018-08-07: qty 2

## 2018-08-07 MED ORDER — SODIUM CHLORIDE 0.9 % IV SOLN
420.0000 mg | Freq: Once | INTRAVENOUS | Status: AC
  Administered 2018-08-07: 420 mg via INTRAVENOUS
  Filled 2018-08-07: qty 14

## 2018-08-07 MED ORDER — HEPARIN SOD (PORK) LOCK FLUSH 100 UNIT/ML IV SOLN
500.0000 [IU] | Freq: Once | INTRAVENOUS | Status: AC | PRN
  Administered 2018-08-07: 500 [IU]
  Filled 2018-08-07: qty 5

## 2018-08-07 MED ORDER — FAMOTIDINE IN NACL 20-0.9 MG/50ML-% IV SOLN
20.0000 mg | Freq: Once | INTRAVENOUS | Status: AC
  Administered 2018-08-07: 20 mg via INTRAVENOUS
  Filled 2018-08-07: qty 50

## 2018-08-07 MED ORDER — SODIUM CHLORIDE 0.9 % IV SOLN
Freq: Once | INTRAVENOUS | Status: AC
  Administered 2018-08-07: 10:00:00 via INTRAVENOUS

## 2018-08-07 MED ORDER — SODIUM CHLORIDE 0.9 % IV SOLN
75.0000 mg/m2 | Freq: Once | INTRAVENOUS | Status: AC
  Administered 2018-08-07: 140 mg via INTRAVENOUS
  Filled 2018-08-07: qty 14

## 2018-08-07 MED ORDER — SODIUM CHLORIDE 0.9 % IV SOLN
Freq: Once | INTRAVENOUS | Status: AC
  Administered 2018-08-07: 10:00:00 via INTRAVENOUS
  Filled 2018-08-07: qty 4

## 2018-08-07 NOTE — Progress Notes (Signed)
Tolerated infusions w/o adverse reaction.  Alert, in no distress.  VSS.  Discharged ambulatory in c/o caregiver.

## 2018-08-07 NOTE — Progress Notes (Signed)
Diagnosis No diagnosis found.  Staging Cancer Staging No matching staging information was found for the patient.  Assessment and Plan:   1.  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 - PET/CT scan on 05/11/2018 shows bilateral hypermetabolic pulmonary nodules consistent with pulmonary metastasis with no metastatic disease outside of the thorax. - On 05/23/2018, lung biopsy shows poorly differentiated metastatic carcinoma, consistent with breast primary. - Echocardiogram on 05/03/2018 shows ejection fraction of 60 to 65%.  - Patient is a resident at [facility] in [place] for the last few years secondary to dementia.  Her healthcare proxy was her Sister Neoma Laming who lives at [employer].  Pt is being treated for advanced breast cancer  in the palliative setting. - Pt is seen today for evaluation prior to C4 of Taxotere dosed at 75 mg/m2, Herceptin 6 mg/kg and Perjeta 420 mg IV every 3 weeks for 6 cycles.  PT should be set up for repeat imaging after 6 cycles.  Mass in left breast is improved on evaluation today.  Sister understands goal of therapy and has given consent to treat.    Labs done 08/07/2018 reviewed and showed WBC 10.5 HB 12.3 plts 390,000.  Chemistries WNL with K+ 4.4 Cr 0.48 and normal LFTs.  Echo done 07/25/2018 reviewed and showed EF 60-65%.  Pt will proceed with C4 and  Follow-up in 08/2018 prior to C5 of THP.     Option of genetic testing as she has 2 young daughters.  2.  Cellulitis.  Pt has minor lesions noted on upper back and lower legs.  Pt afebrile in clinic.  Primarily scarring and scabbed lesions noted.  Rx for Bactrim DS po bid for 7 days recommended and discussed with Caryl Pina at [facility].  Pt should also have evaluation by NH physician for any  worsening of symptoms.    3.  Dementia.  Sister reports this affected several family members.  Pt is alert and conversant.  She is currently in [facility].  Sister is Marine scientist.   4.  Nausea.  NH to continue nausea regimen.   5.  Health maintenance.  Pt was not getting mammograms since being in facility.  She is not sure when last colonoscopy was done.    25 minutes spent with more than 50% spent in counseling and coordination of care.    Interval history:  Historical data obtained from note dated 05/16/2018.   62 year old female referred for evaluation due to breast cancer.  She is accompanied by nursing home worker.  Patient reportedly has been in nursing facility since September 2018.  She reportedly was diagnosed with dementia which reportedly runs in her family.  Power of attorney is available via phone.  The patient reportedly noted an area in the left breast and brought that to the facility's attention.       She subsequently had bilateral diagnostic mammogram done on 04/04/2018 that showed  IMPRESSION: 1. Highly suspicious left breast mass with associated calcifications measuring 4.6 cm sonographically.  2. Suspicious lymph node in the left axilla with focal nodular cortical thickening.  3.  No findings of malignancy in the right breast.  RECOMMENDATION: 1. Recommend ultrasound-guided biopsy of the palpable mass in the left breast at the 1 to 2 o'clock position.  2. Recommend ultrasound-guided  biopsy of the abnormal lymph node in the left axilla.     Patient underwent ultrasound-guided biopsy of the left breast on 04/11/2018.  Clip was placed. Pathology returned as : ADDITIONAL INFORMATION: 1. Immunohistochemical stain for E-cadherin is positive, consistent with a ductal phenotype. Jaquita Folds MD Pathologist, Electronic Signature ( Signed 04/14/2018) 1. PROGNOSTIC INDICATORS Results: IMMUNOHISTOCHEMICAL AND MORPHOMETRIC ANALYSIS PERFORMED MANUALLY The tumor  cells are Positive for Her2 (3+). Estrogen Receptor: 0%, NEGATIVE Progesterone Receptor: 0%, NEGATIVE Proliferation Marker Ki67: 50% REFERENCE RANGE ESTROGEN RECEPTOR NEGATIVE 0% POSITIVE =>1% REFERENCE RANGE PROGESTERONE RECEPTOR NEGATIVE 0% POSITIVE =>1% All controls stained appropriately Vicente Males MD Pathologist, Electronic Signature ( Signed 04/14/2018) FINAL DIAGNOSIS 1 of 3 FINAL for [name], [name] ([id number]) Diagnosis 1. Breast, left, needle core biopsy, 2:00 - INVASIVE MAMMARY CARCINOMA. SEE NOTE. 2. Lymph node, needle/core biopsy, left axilla - INVOLVED BY MAMMARY CARCINOMA   Current Status:  Pt is seen today for follow-up prior to C4 of THP.  She reports some changes on legs and back.      Malignant neoplasm of overlapping sites of left female breast ([place])   05/15/2018 Initial Diagnosis    Malignant neoplasm of overlapping sites of left female breast ([name])    06/05/2018 -  Chemotherapy    The patient had pegfilgrastim-cbqv (UDENYCA) injection 6 mg, 6 mg, Subcutaneous, Once, 4 of 6 cycles Administration: 6 mg (06/07/2018), 6 mg (06/28/2018), 6 mg (07/19/2018) trastuzumab (HERCEPTIN) 600 mg in sodium chloride 0.9 % 250 mL chemo infusion, 609 mg, Intravenous,  Once, 4 of 6 cycles Administration: 600 mg (06/05/2018), 450 mg (06/26/2018), 450 mg (07/17/2018) DOCEtaxel (TAXOTERE) 140 mg in sodium chloride 0.9 % 250 mL chemo infusion, 75 mg/m2 = 140 mg, Intravenous,  Once, 4 of 6 cycles Administration: 140 mg (06/05/2018), 140 mg (06/26/2018), 140 mg (07/17/2018) ondansetron (ZOFRAN) 8 mg, dexamethasone (DECADRON) 4 mg in sodium chloride 0.9 % 50 mL IVPB, , Intravenous,  Once, 4 of 6 cycles Administration:  (06/05/2018),  (06/26/2018),  (07/17/2018),  (08/07/2018) pertuzumab (PERJETA) 840 mg in sodium chloride 0.9 % 250 mL chemo infusion, 840 mg, Intravenous, Once, 4 of 6 cycles Administration: 840 mg (06/05/2018), 420 mg (06/26/2018), 420 mg (07/17/2018)  for chemotherapy  treatment.       Problem List Patient Active Problem List   Diagnosis Date Noted  . Goals of care, counseling/discussion [Z71.89] 05/30/2018  . HER2-positive carcinoma of left breast ([place]) [C50.912] 05/30/2018  . Malignant neoplasm of overlapping sites of left female breast ([place]) [C50.812]     Past Medical History Past Medical History:  Diagnosis Date  . Alzheimer's dementia ([place])   . Bradycardia   . Breast cancer ([place])   . Dementia ([place])   . Depression   . Hypokalemia   . Hypotension   . 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 ABDOMINAL HYSTERECTOMY    . BREAST BIOPSY Left   . PORTACATH PLACEMENT Right 05/15/2018   Procedure: INSERTION PORT-A-CATH;  Surgeon: Aviva Signs, MD;  Location: [facility];  Service: General;  Laterality: Right;    Family History History reviewed. No pertinent family history.   Social History  has an unknown smoking status. She has never used smokeless tobacco. She reports previous alcohol use. She reports that she does not use drugs.  Medications  Current Outpatient Medications:  .  acetaminophen (TYLENOL) 325 MG tablet, Take 650 mg by mouth every 6 (six)  hours as needed for mild pain., Disp: , Rfl:  .  hydrocortisone 1 % ointment, Apply 1 application topically 4 (four) times daily., Disp: , Rfl:  .  ALPRAZolam (XANAX) 0.25 MG tablet, Take 0.25 mg by mouth every 8 (eight) hours as needed for anxiety. , Disp: , Rfl:  .  cholecalciferol (VITAMIN D) 1000 units tablet, Take 2,000 Units by mouth daily. , Disp: , Rfl:  .  divalproex (DEPAKOTE) 250 MG DR tablet, Take 250 mg by mouth at bedtime., Disp: , Rfl:  .  divalproex (DEPAKOTE) 500 MG DR tablet, Take 500 mg by mouth daily. , Disp: , Rfl:  .  DOCEtaxel (TAXOTERE IV), Inject into the vein., Disp: , Rfl:  .  docusate sodium (COLACE) 100 MG capsule, Take 100 mg by mouth 2 (two) times  daily., Disp: , Rfl:  .  donepezil (ARICEPT) 10 MG tablet, Take 10 mg by mouth at bedtime., Disp: , Rfl:  .  lidocaine-prilocaine (EMLA) cream, Apply to affected area once, Disp: 30 g, Rfl: 3 .  memantine (NAMENDA) 10 MG tablet, Take 10 mg by mouth 2 (two) times daily., Disp: , Rfl:  .  ondansetron (ZOFRAN) 4 MG tablet, Take 4 mg by mouth every 8 (eight) hours as needed for nausea or vomiting., Disp: , Rfl:  .  pegfilgrastim-cbqv (UDENYCA) 6 MG/0.6ML injection, Inject 6 mg into the skin once., Disp: , Rfl:  .  Pertuzumab (PERJETA IV), Inject into the vein., Disp: , Rfl:  .  prochlorperazine (COMPAZINE) 10 MG tablet, Take 1 tablet (10 mg total) by mouth every 6 (six) hours as needed (Nausea or vomiting)., Disp: 30 tablet, Rfl: 1 .  QUEtiapine (SEROQUEL) 25 MG tablet, Take 12.5 mg by mouth 3 (three) times daily., Disp: , Rfl:  .  sertraline (ZOLOFT) 100 MG tablet, Take 150 mg by mouth daily. , Disp: , Rfl:  .  Trastuzumab (HERCEPTIN IV), Inject into the vein., Disp: , Rfl:  No current facility-administered medications for this visit.   Facility-Administered Medications Ordered in Other Visits:  .  DOCEtaxel (TAXOTERE) 140 mg in sodium chloride 0.9 % 250 mL chemo infusion, 75 mg/m2 (Treatment Plan Recorded), Intravenous, Once, Argusta Mcgann, MD, Last Rate: 264 mL/hr at 08/07/18 1250, 140 mg at 08/07/18 1250 .  heparin lock flush 100 unit/mL, 500 Units, Intracatheter, Once PRN, Yago Ludvigsen, MD .  sodium chloride flush (NS) 0.9 % injection 10 mL, 10 mL, Intracatheter, PRN, Trinitey Roache, MD, 10 mL at 07/17/18 0929  Allergies Patient has no known allergies.  Review of Systems Review of Systems - Oncology ROS negative other than skin lesions on legs and back   Physical Exam  Vitals Wt Readings from Last 3 Encounters:  08/07/18 152 lb 4.8 oz (69.1 kg)  07/17/18 155 lb (70.3 kg)  06/26/18 153 lb 14.4 oz (69.8 kg)   Temp Readings from Last 3 Encounters:  08/07/18 98.4 F (36.9 C) (Oral)   07/19/18 98.6 F (37 C) (Oral)  07/17/18 98.5 F (36.9 C) (Oral)   BP Readings from Last 3 Encounters:  08/07/18 (!) 100/52  07/19/18 (!) 107/44  07/17/18 97/63   Pulse Readings from Last 3 Encounters:  08/07/18 69  07/19/18 70  07/17/18 78    Constitutional: Well-developed, well-nourished, and in no distress.   HENT: Head: Normocephalic and atraumatic.  Mouth/Throat: No oropharyngeal exudate. Mucosa moist. Eyes: Pupils are equal, round, and reactive to light. Conjunctivae are normal. No scleral icterus.  Neck: Normal range of motion. Neck supple. No  JVD present.  Cardiovascular: Normal rate, regular rhythm and normal heart sounds.  Exam reveals no gallop and no friction rub.   No murmur heard. Pulmonary/Chest: Effort normal and breath sounds normal. No respiratory distress. No wheezes.No rales.  Abdominal: Soft. Bowel sounds are normal. No distension. There is no tenderness. There is no guarding.  Musculoskeletal: No edema or tenderness.  Lymphadenopathy: No cervical, axillary or supraclavicular adenopathy.  Neurological: Alert and oriented to person, place, and time. No cranial nerve deficit.  Skin: Skin is warm and dry. Pt has some scarring noted on upper back and lower legs and some scabbed lesions noted.   Psychiatric: Affect and judgment normal.  Breast exam:  Chaperone present.  Mass in left breast improved.  Measures 1.5 cm today.  No dominant masses palpable in right breast.    Labs Appointment on 08/07/2018  Component Date Value Ref Range Status  . WBC 08/07/2018 10.5  4.0 - 10.5 K/uL Final  . RBC 08/07/2018 4.11  3.87 - 5.11 MIL/uL Final  . Hemoglobin 08/07/2018 12.3  12.0 - 15.0 g/dL Final  . HCT 08/07/2018 39.7  36.0 - 46.0 % Final  . MCV 08/07/2018 96.6  80.0 - 100.0 fL Final  . MCH 08/07/2018 29.9  26.0 - 34.0 pg Final  . MCHC 08/07/2018 31.0  30.0 - 36.0 g/dL Final  . RDW 08/07/2018 17.6* 11.5 - 15.5 % Final  . Platelets 08/07/2018 390  150 - 400 K/uL  Final  . nRBC 08/07/2018 0.0  0.0 - 0.2 % Final  . Neutrophils Relative % 08/07/2018 74  % Final  . Neutro Abs 08/07/2018 7.8* 1.7 - 7.7 K/uL Final  . Lymphocytes Relative 08/07/2018 17  % Final  . Lymphs Abs 08/07/2018 1.8  0.7 - 4.0 K/uL Final  . Monocytes Relative 08/07/2018 8  % Final  . Monocytes Absolute 08/07/2018 0.8  0.1 - 1.0 K/uL Final  . Eosinophils Relative 08/07/2018 0  % Final  . Eosinophils Absolute 08/07/2018 0.0  0.0 - 0.5 K/uL Final  . Basophils Relative 08/07/2018 0  % Final  . Basophils Absolute 08/07/2018 0.0  0.0 - 0.1 K/uL Final  . Immature Granulocytes 08/07/2018 1  % Final  . Abs Immature Granulocytes 08/07/2018 0.05  0.00 - 0.07 K/uL Final   Performed at [facility], [address] [place], [place] [address]  . Sodium 08/07/2018 140  135 - 145 mmol/L Final  . Potassium 08/07/2018 4.4  3.5 - 5.1 mmol/L Final  . Chloride 08/07/2018 103  98 - 111 mmol/L Final  . CO2 08/07/2018 28  22 - 32 mmol/L Final  . Glucose, Bld 08/07/2018 82  70 - 99 mg/dL Final  . BUN 08/07/2018 12  8 - 23 mg/dL Final  . Creatinine, Ser 08/07/2018 0.48  0.44 - 1.00 mg/dL Final  . Calcium 08/07/2018 9.5  8.9 - 10.3 mg/dL Final  . Total Protein 08/07/2018 7.7  6.5 - 8.1 g/dL Final  . Albumin 08/07/2018 3.9  3.5 - 5.0 g/dL Final  . AST 08/07/2018 15  15 - 41 U/L Final  . ALT 08/07/2018 11  0 - 44 U/L Final  . Alkaline Phosphatase 08/07/2018 73  38 - 126 U/L Final  . Total Bilirubin 08/07/2018 0.5  0.3 - 1.2 mg/dL Final  . GFR calc non Af Amer 08/07/2018 >60  >60 mL/min Final  . GFR calc Af Amer 08/07/2018 >60  >60 mL/min Final  . Anion gap 08/07/2018 9  5 - 15 Final  Performed at [facility], [address], [place], [place] [address]  . LDH 08/07/2018 142  98 - 192 U/L Final   Performed at [facility], [address] [place],  [address]     Pathology No orders of the defined types were placed in this encounter.      Zoila Shutter MD

## 2018-08-09 ENCOUNTER — Ambulatory Visit (HOSPITAL_COMMUNITY): Payer: Medicaid Other

## 2018-08-10 ENCOUNTER — Encounter (HOSPITAL_COMMUNITY): Payer: Self-pay

## 2018-08-10 ENCOUNTER — Inpatient Hospital Stay (HOSPITAL_COMMUNITY): Payer: Medicaid Other

## 2018-08-10 ENCOUNTER — Other Ambulatory Visit: Payer: Self-pay

## 2018-08-10 VITALS — BP 113/56 | HR 99 | Temp 98.4°F | Resp 18

## 2018-08-10 DIAGNOSIS — C50812 Malignant neoplasm of overlapping sites of left female breast: Secondary | ICD-10-CM

## 2018-08-10 DIAGNOSIS — Z171 Estrogen receptor negative status [ER-]: Principal | ICD-10-CM

## 2018-08-10 DIAGNOSIS — Z5111 Encounter for antineoplastic chemotherapy: Secondary | ICD-10-CM | POA: Diagnosis not present

## 2018-08-10 MED ORDER — PEGFILGRASTIM-CBQV 6 MG/0.6ML ~~LOC~~ SOSY
6.0000 mg | PREFILLED_SYRINGE | Freq: Once | SUBCUTANEOUS | Status: AC
  Administered 2018-08-10: 6 mg via SUBCUTANEOUS

## 2018-08-10 MED ORDER — PEGFILGRASTIM-CBQV 6 MG/0.6ML ~~LOC~~ SOSY
PREFILLED_SYRINGE | SUBCUTANEOUS | Status: AC
  Filled 2018-08-10: qty 0.6

## 2018-08-10 NOTE — Progress Notes (Signed)
[name] [name] presents today for injection per the provider's orders.  Udenyca administration without incident; see MAR for injection details.  Patient tolerated procedure well and without incident.  No questions or complaints noted at this time.  Discharged ambulatory in c/o caregiver.

## 2018-08-28 ENCOUNTER — Inpatient Hospital Stay (HOSPITAL_COMMUNITY): Payer: Medicaid Other

## 2018-08-28 ENCOUNTER — Encounter (HOSPITAL_COMMUNITY): Payer: Self-pay

## 2018-08-28 ENCOUNTER — Other Ambulatory Visit: Payer: Self-pay

## 2018-08-28 ENCOUNTER — Inpatient Hospital Stay (HOSPITAL_COMMUNITY): Payer: Medicaid Other | Attending: Internal Medicine | Admitting: Internal Medicine

## 2018-08-28 VITALS — BP 104/60 | HR 69 | Temp 97.6°F | Resp 18 | Wt 154.4 lb

## 2018-08-28 DIAGNOSIS — L03115 Cellulitis of right lower limb: Secondary | ICD-10-CM

## 2018-08-28 DIAGNOSIS — C773 Secondary and unspecified malignant neoplasm of axilla and upper limb lymph nodes: Secondary | ICD-10-CM | POA: Insufficient documentation

## 2018-08-28 DIAGNOSIS — C7801 Secondary malignant neoplasm of right lung: Secondary | ICD-10-CM | POA: Diagnosis not present

## 2018-08-28 DIAGNOSIS — Z5112 Encounter for antineoplastic immunotherapy: Secondary | ICD-10-CM | POA: Diagnosis not present

## 2018-08-28 DIAGNOSIS — Z5189 Encounter for other specified aftercare: Secondary | ICD-10-CM | POA: Insufficient documentation

## 2018-08-28 DIAGNOSIS — R110 Nausea: Secondary | ICD-10-CM

## 2018-08-28 DIAGNOSIS — Z171 Estrogen receptor negative status [ER-]: Secondary | ICD-10-CM

## 2018-08-28 DIAGNOSIS — C50812 Malignant neoplasm of overlapping sites of left female breast: Secondary | ICD-10-CM

## 2018-08-28 DIAGNOSIS — C50912 Malignant neoplasm of unspecified site of left female breast: Secondary | ICD-10-CM

## 2018-08-28 DIAGNOSIS — C7800 Secondary malignant neoplasm of unspecified lung: Secondary | ICD-10-CM | POA: Diagnosis not present

## 2018-08-28 DIAGNOSIS — L988 Other specified disorders of the skin and subcutaneous tissue: Secondary | ICD-10-CM

## 2018-08-28 DIAGNOSIS — Z5111 Encounter for antineoplastic chemotherapy: Secondary | ICD-10-CM | POA: Insufficient documentation

## 2018-08-28 DIAGNOSIS — L03119 Cellulitis of unspecified part of limb: Secondary | ICD-10-CM | POA: Diagnosis not present

## 2018-08-28 DIAGNOSIS — L03116 Cellulitis of left lower limb: Secondary | ICD-10-CM

## 2018-08-28 DIAGNOSIS — F0390 Unspecified dementia without behavioral disturbance: Secondary | ICD-10-CM

## 2018-08-28 LAB — COMPREHENSIVE METABOLIC PANEL
ALK PHOS: 62 U/L (ref 38–126)
ALT: 12 U/L (ref 0–44)
AST: 17 U/L (ref 15–41)
Albumin: 3.4 g/dL — ABNORMAL LOW (ref 3.5–5.0)
Anion gap: 7 (ref 5–15)
BILIRUBIN TOTAL: 0.5 mg/dL (ref 0.3–1.2)
BUN: 12 mg/dL (ref 8–23)
CO2: 27 mmol/L (ref 22–32)
Calcium: 8.8 mg/dL — ABNORMAL LOW (ref 8.9–10.3)
Chloride: 104 mmol/L (ref 98–111)
Creatinine, Ser: 0.52 mg/dL (ref 0.44–1.00)
GFR calc Af Amer: >60 mL/min (ref >60)
GFR calc non Af Amer: >60 mL/min (ref >60)
Glucose, Bld: 103 mg/dL — ABNORMAL HIGH (ref 70–99)
Potassium: 4.2 mmol/L (ref 3.5–5.1)
Sodium: 138 mmol/L (ref 135–145)
TOTAL PROTEIN: 6.7 g/dL (ref 6.5–8.1)

## 2018-08-28 LAB — CBC WITH DIFFERENTIAL/PLATELET
Abs Immature Granulocytes: 0.02 10*3/uL (ref 0.00–0.07)
Basophils Absolute: 0 10*3/uL (ref 0.0–0.1)
Basophils Relative: 0 %
Eosinophils Absolute: 0 10*3/uL (ref 0.0–0.5)
Eosinophils Relative: 0 %
HCT: 37.6 % (ref 36.0–46.0)
HEMOGLOBIN: 11.4 g/dL — AB (ref 12.0–15.0)
Immature Granulocytes: 0 %
Lymphocytes Relative: 16 %
Lymphs Abs: 1.2 10*3/uL (ref 0.7–4.0)
MCH: 29.8 pg (ref 26.0–34.0)
MCHC: 30.3 g/dL (ref 30.0–36.0)
MCV: 98.4 fL (ref 80.0–100.0)
MONO ABS: 0.5 10*3/uL (ref 0.1–1.0)
MONOS PCT: 7 %
Neutro Abs: 5.8 10*3/uL (ref 1.7–7.7)
Neutrophils Relative %: 77 %
Platelets: 305 10*3/uL (ref 150–400)
RBC: 3.82 MIL/uL — ABNORMAL LOW (ref 3.87–5.11)
RDW: 18.1 % — ABNORMAL HIGH (ref 11.5–15.5)
WBC: 7.6 10*3/uL (ref 4.0–10.5)
nRBC: 0 % (ref 0.0–0.2)

## 2018-08-28 MED ORDER — FAMOTIDINE IN NACL 20-0.9 MG/50ML-% IV SOLN
20.0000 mg | Freq: Once | INTRAVENOUS | Status: AC
  Administered 2018-08-28: 20 mg via INTRAVENOUS
  Filled 2018-08-28: qty 50

## 2018-08-28 MED ORDER — SODIUM CHLORIDE 0.9% FLUSH
10.0000 mL | INTRAVENOUS | Status: DC | PRN
  Administered 2018-08-28 (×2): 10 mL
  Filled 2018-08-28 (×2): qty 10

## 2018-08-28 MED ORDER — SODIUM CHLORIDE 0.9 % IV SOLN
Freq: Once | INTRAVENOUS | Status: AC
  Administered 2018-08-28: 10:00:00 via INTRAVENOUS
  Filled 2018-08-28: qty 4

## 2018-08-28 MED ORDER — HEPARIN SOD (PORK) LOCK FLUSH 100 UNIT/ML IV SOLN
500.0000 [IU] | Freq: Once | INTRAVENOUS | Status: AC | PRN
  Administered 2018-08-28: 500 [IU]
  Filled 2018-08-28: qty 5

## 2018-08-28 MED ORDER — SODIUM CHLORIDE 0.9 % IV SOLN
Freq: Once | INTRAVENOUS | Status: AC
  Administered 2018-08-28: 10:00:00 via INTRAVENOUS

## 2018-08-28 MED ORDER — DIPHENHYDRAMINE HCL 25 MG PO CAPS
50.0000 mg | ORAL_CAPSULE | Freq: Once | ORAL | Status: AC
  Administered 2018-08-28: 50 mg via ORAL
  Filled 2018-08-28: qty 2

## 2018-08-28 MED ORDER — ACETAMINOPHEN 325 MG PO TABS
650.0000 mg | ORAL_TABLET | Freq: Once | ORAL | Status: AC
  Administered 2018-08-28: 650 mg via ORAL
  Filled 2018-08-28: qty 2

## 2018-08-28 MED ORDER — SODIUM CHLORIDE 0.9 % IV SOLN
420.0000 mg | Freq: Once | INTRAVENOUS | Status: AC
  Administered 2018-08-28: 420 mg via INTRAVENOUS
  Filled 2018-08-28: qty 14

## 2018-08-28 MED ORDER — TRASTUZUMAB CHEMO 150 MG IV SOLR
450.0000 mg | Freq: Once | INTRAVENOUS | Status: AC
  Administered 2018-08-28: 450 mg via INTRAVENOUS
  Filled 2018-08-28: qty 21.43

## 2018-08-28 MED ORDER — SODIUM CHLORIDE 0.9 % IV SOLN
75.0000 mg/m2 | Freq: Once | INTRAVENOUS | Status: AC
  Administered 2018-08-28: 140 mg via INTRAVENOUS
  Filled 2018-08-28: qty 14

## 2018-08-28 NOTE — Patient Instructions (Signed)
[facility] Discharge Instructions for Patients Receiving Chemotherapy  Today you received the following chemotherapy agents   To help prevent nausea and vomiting after your treatment, we encourage you to take your nausea medication   If you develop nausea and vomiting that is not controlled by your nausea medication, call the clinic.   BELOW ARE SYMPTOMS THAT SHOULD BE REPORTED IMMEDIATELY:  *FEVER GREATER THAN 100.5 F  *CHILLS WITH OR WITHOUT FEVER  NAUSEA AND VOMITING THAT IS NOT CONTROLLED WITH YOUR NAUSEA MEDICATION  *UNUSUAL SHORTNESS OF BREATH  *UNUSUAL BRUISING OR BLEEDING  TENDERNESS IN MOUTH AND THROAT WITH OR WITHOUT PRESENCE OF ULCERS  *URINARY PROBLEMS  *BOWEL PROBLEMS  UNUSUAL RASH Items with * indicate a potential emergency and should be followed up as soon as possible.  Feel free to call the clinic should you have any questions or concerns. The clinic phone number is [phone].  Please show the CHEMO ALERT CARD at check-in to the Emergency Department and triage nurse.   

## 2018-08-28 NOTE — Progress Notes (Signed)
Diagnosis HER2-positive carcinoma of left breast ([place]) - Plan: CBC with Differential/Platelet, Comprehensive metabolic panel, Lactate dehydrogenase  Staging Cancer Staging No matching staging information was found for the patient.  Assessment and Plan:  1.  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 - PET/CT scan on 05/11/2018 shows bilateral hypermetabolic pulmonary nodules consistent with pulmonary metastasis with no metastatic disease outside of the thorax. - On 05/23/2018, lung biopsy shows poorly differentiated metastatic carcinoma, consistent with breast primary. - Echocardiogram on 05/03/2018 shows ejection fraction of 60 to 65%.  - Patient is a resident at [facility] in [place] for the last few years secondary to dementia.  Her healthcare proxy was her Sister [name] [name] who lives at [employer].  Pt is being treated for advanced breast cancer  in the palliative setting. - Pt is seen today for evaluation prior to C5 of Taxotere dosed at 75 mg/m2, Herceptin 6 mg/kg and Perjeta 420 mg IV every 3 weeks for 6 cycles.  PT will be set up for repeat imaging after 6 cycles.  Mass in left breast is improved on evaluation today.  Sister understands goal of therapy and has given consent to treat.    Labs done 08/28/2018 reviewed and showed WBC 7.6 HB 11.4 plts 305,000.  Chemistries WNL with K+ 4.2 Cr 0.52 and normal LFTs. Echo done 07/25/2018 showed EF 60-65%.  Pt will proceed with C5 and will be seen for follow-up in 3 weeks prior to C6.     Option of genetic testing as she has 2 young daughters discussed with sister [name] [name] and she does not presently desire pt to undergo testing.    2.  Cellulitis/lip dryness.  Pt has minor lesions noted on upper back and lower legs.  Pt afebrile in  clinic.  Primarily scarring and scabbed lesions noted.  She was recommended for Bactrim DS po bid for 7 days recommended and discussed with [name] [name] at [facility]  Areas are improved.  Pt advised to use neosporin ointment to areas.  Carmex lip ointment recommended.  Option also for abreva lip care if fever blisters or cold sores suspected.  Pt should also have evaluation by NH physician for any worsening of symptoms.    3.  Dementia.  Sister reports this affected several family members.  Pt is alert and conversant.  She is currently in [facility].  Sister is Marine scientist.   4.  Nausea.  NH to continue nausea regimen.   5.  Health maintenance.  Pt was not getting mammograms since being in facility.  She is not sure when last colonoscopy was done.    25 minutes spent with more than 50% spent in counseling and coordination of care.    Interval history:  Historical data obtained from note dated 05/16/2018.   63 year old female referred for evaluation due to breast cancer.  She is accompanied by nursing home worker.  Patient reportedly has been in nursing facility since September 2018.  She reportedly was diagnosed with dementia which reportedly runs in her family.  Power of attorney is available via phone.  The patient reportedly noted an area in the left breast and brought that to the facility's attention.       She subsequently had bilateral diagnostic mammogram done on 04/04/2018 that showed  IMPRESSION: 1.  Highly suspicious left breast mass with associated calcifications measuring 4.6 cm sonographically.  2. Suspicious lymph node in the left axilla with focal nodular cortical thickening.  3.  No findings of malignancy in the right breast.  RECOMMENDATION: 1. Recommend ultrasound-guided biopsy of the palpable mass in the left breast at the 1 to 2 o'clock position.  2. Recommend ultrasound-guided biopsy of the abnormal lymph node in the left axilla.     Patient underwent  ultrasound-guided biopsy of the left breast on 04/11/2018.  Clip was placed. Pathology returned as : ADDITIONAL INFORMATION: 1. Immunohistochemical stain for E-cadherin is positive, consistent with a ductal phenotype. [name] Folds MD Pathologist, Electronic Signature ( Signed 04/14/2018) 1. PROGNOSTIC INDICATORS Results: IMMUNOHISTOCHEMICAL AND MORPHOMETRIC ANALYSIS PERFORMED MANUALLY The tumor cells are Positive for Her2 (3+). Estrogen Receptor: 0%, NEGATIVE Progesterone Receptor: 0%, NEGATIVE Proliferation Marker Ki67: 50% REFERENCE RANGE ESTROGEN RECEPTOR NEGATIVE 0% POSITIVE =>1% REFERENCE RANGE PROGESTERONE RECEPTOR NEGATIVE 0% POSITIVE =>1% All controls stained appropriately [name] Males MD Pathologist, Electronic Signature ( Signed 04/14/2018) FINAL DIAGNOSIS 1 of 3 FINAL for [name] [name], [name] ([id number]) Diagnosis 1. Breast, left, needle core biopsy, 2:00 - INVASIVE MAMMARY CARCINOMA. SEE NOTE. 2. Lymph node, needle/core biopsy, left axilla - INVOLVED BY MAMMARY CARCINOMA   Current Status:  Pt is seen today for follow-up prior to C5 of THP.  She reports some skin lesions on lips and dryness.  Family present.       Malignant neoplasm of overlapping sites of left female breast ([place])   05/15/2018 Initial Diagnosis    Malignant neoplasm of overlapping sites of left female breast ([place])    06/05/2018 -  Chemotherapy    The patient had pegfilgrastim-cbqv (UDENYCA) injection 6 mg, 6 mg, Subcutaneous, Once, 5 of 6 cycles Administration: 6 mg (06/07/2018), 6 mg (06/28/2018), 6 mg (07/19/2018), 6 mg (08/10/2018) trastuzumab (HERCEPTIN) 600 mg in sodium chloride 0.9 % 250 mL chemo infusion, 609 mg, Intravenous,  Once, 5 of 6 cycles Administration: 600 mg (06/05/2018), 450 mg (06/26/2018), 450 mg (07/17/2018), 450 mg (08/07/2018) DOCEtaxel (TAXOTERE) 140 mg in sodium chloride 0.9 % 250 mL chemo infusion, 75 mg/m2 = 140 mg, Intravenous,  Once, 5 of 6 cycles Administration: 140 mg  (06/05/2018), 140 mg (06/26/2018), 140 mg (07/17/2018), 140 mg (08/07/2018) ondansetron (ZOFRAN) 8 mg, dexamethasone (DECADRON) 4 mg in sodium chloride 0.9 % 50 mL IVPB, , Intravenous,  Once, 5 of 6 cycles Administration:  (06/05/2018),  (06/26/2018),  (07/17/2018),  (08/07/2018) pertuzumab (PERJETA) 840 mg in sodium chloride 0.9 % 250 mL chemo infusion, 840 mg, Intravenous, Once, 5 of 6 cycles Administration: 840 mg (06/05/2018), 420 mg (06/26/2018), 420 mg (07/17/2018), 420 mg (08/07/2018)  for chemotherapy treatment.       Problem List Patient Active Problem List   Diagnosis Date Noted  . Goals of care, counseling/discussion [Z71.89] 05/30/2018  . 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 Malignant neoplasm of overlapping sites of left female breast ([place]) [C50.812]     Past Medical History Past Medical History:  Diagnosis Date  . Alzheimer's dementia ([place])   . Bradycardia   . Breast cancer ([place])   . Dementia ([place])   . Depression   . Hypokalemia   . Hypotension   . 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 ABDOMINAL HYSTERECTOMY    . BREAST BIOPSY Left   . PORTACATH PLACEMENT Right 05/15/2018   Procedure:  INSERTION PORT-A-CATH;  Surgeon: Aviva Signs, MD;  Location: [facility];  Service: General;  Laterality: Right;    Family History No family history on file.   Social History  has an unknown smoking status. She has never used smokeless tobacco. She reports previous alcohol use. She reports that she does not use drugs.  Medications  Current Outpatient Medications:  .  acetaminophen (TYLENOL) 325 MG tablet, Take 650 mg by mouth every 6 (six) hours as needed for mild pain., Disp: , Rfl:  .  ALPRAZolam (XANAX) 0.25 MG tablet, Take 0.25 mg by mouth every 8 (eight) hours as needed for anxiety. , Disp: , Rfl:  .  cholecalciferol (VITAMIN D)  1000 units tablet, Take 2,000 Units by mouth daily. , Disp: , Rfl:  .  divalproex (DEPAKOTE) 250 MG DR tablet, Take 250 mg by mouth at bedtime., Disp: , Rfl:  .  divalproex (DEPAKOTE) 500 MG DR tablet, Take 500 mg by mouth daily. , Disp: , Rfl:  .  DOCEtaxel (TAXOTERE IV), Inject into the vein., Disp: , Rfl:  .  docusate sodium (COLACE) 100 MG capsule, Take 100 mg by mouth 2 (two) times daily., Disp: , Rfl:  .  donepezil (ARICEPT) 10 MG tablet, Take 10 mg by mouth at bedtime., Disp: , Rfl:  .  hydrocortisone 1 % ointment, Apply 1 application topically 4 (four) times daily., Disp: , Rfl:  .  lidocaine-prilocaine (EMLA) cream, Apply to affected area once, Disp: 30 g, Rfl: 3 .  memantine (NAMENDA) 10 MG tablet, Take 10 mg by mouth 2 (two) times daily., Disp: , Rfl:  .  ondansetron (ZOFRAN) 4 MG tablet, Take 4 mg by mouth every 8 (eight) hours as needed for nausea or vomiting., Disp: , Rfl:  .  pegfilgrastim-cbqv (UDENYCA) 6 MG/0.6ML injection, Inject 6 mg into the skin once., Disp: , Rfl:  .  Pertuzumab (PERJETA IV), Inject into the vein., Disp: , Rfl:  .  prochlorperazine (COMPAZINE) 10 MG tablet, Take 1 tablet (10 mg total) by mouth every 6 (six) hours as needed (Nausea or vomiting)., Disp: 30 tablet, Rfl: 1 .  QUEtiapine (SEROQUEL) 25 MG tablet, Take 12.5 mg by mouth 3 (three) times daily., Disp: , Rfl:  .  sertraline (ZOLOFT) 100 MG tablet, Take 150 mg by mouth daily. , Disp: , Rfl:  .  Trastuzumab (HERCEPTIN IV), Inject into the vein., Disp: , Rfl:  No current facility-administered medications for this visit.   Facility-Administered Medications Ordered in Other Visits:  .  DOCEtaxel (TAXOTERE) 140 mg in sodium chloride 0.9 % 250 mL chemo infusion, 75 mg/m2 (Treatment Plan Recorded), Intravenous, Once, Danthony Kendrix, MD .  heparin lock flush 100 unit/mL, 500 Units, Intracatheter, Once PRN, Eryn Marandola, Mathis Dad, MD .  pertuzumab (PERJETA) 420 mg in sodium chloride 0.9 % 250 mL chemo infusion, 420 mg,  Intravenous, Once, Devlyn Parish, MD .  sodium chloride flush (NS) 0.9 % injection 10 mL, 10 mL, Intracatheter, PRN, Javan Gonzaga, MD, 10 mL at 07/17/18 0929 .  sodium chloride flush (NS) 0.9 % injection 10 mL, 10 mL, Intracatheter, PRN, Dionta Larke, MD, 10 mL at 08/28/18 0950 .  trastuzumab (HERCEPTIN) 450 mg in sodium chloride 0.9 % 250 mL chemo infusion, 450 mg, Intravenous, Once, Lashaundra Lehrmann, MD, Last Rate: 542.9 mL/hr at 08/28/18 1113, 450 mg at 08/28/18 1113  Allergies Patient has no known allergies.  Review of Systems Review of Systems - Oncology ROS negative   Physical Exam  Vitals Wt Readings from Last  3 Encounters:  08/28/18 154 lb 6.4 oz (70 kg)  08/07/18 152 lb 4.8 oz (69.1 kg)  07/17/18 155 lb (70.3 kg)   Temp Readings from Last 3 Encounters:  08/28/18 97.9 F (36.6 C) (Oral)  08/10/18 98.4 F (36.9 C) (Oral)  08/07/18 98.4 F (36.9 C) (Oral)   BP Readings from Last 3 Encounters:  08/28/18 (!) 83/65  08/10/18 (!) 113/56  08/07/18 (!) 100/52   Pulse Readings from Last 3 Encounters:  08/28/18 73  08/10/18 99  08/07/18 69   Constitutional: Well-developed, well-nourished, and in no distress.   HENT: Head: Normocephalic and atraumatic.  Mouth/Throat: No oropharyngeal exudate. Mucosa moist. Eyes: Pupils are equal, round, and reactive to light. Conjunctivae are normal. No scleral icterus.  Neck: Normal range of motion. Neck supple. No JVD present.  Cardiovascular: Normal rate, regular rhythm and normal heart sounds.  Exam reveals no gallop and no friction rub.   No murmur heard. Pulmonary/Chest: Effort normal and breath sounds normal. No respiratory distress. No wheezes.No rales.  Abdominal: Soft. Bowel sounds are normal. No distension. There is no tenderness. There is no guarding.  Musculoskeletal: No edema or tenderness.  Lymphadenopathy: No cervical, axillary or supraclavicular adenopathy.  Neurological: Alert and oriented to person, place, and time. No  cranial nerve deficit.  Skin: Skin is warm and dry. No rash noted. No erythema. No pallor.  Psychiatric: Affect and judgment normal.  Breast exam:  Chaperone present.  Mass in left breast improved, measures 1 cm today.  No dominant masses palpable in right breast.    Labs Appointment on 08/28/2018  Component Date Value Ref Range Status  . WBC 08/28/2018 7.6  4.0 - 10.5 K/uL Final  . RBC 08/28/2018 3.82* 3.87 - 5.11 MIL/uL Final  . Hemoglobin 08/28/2018 11.4* 12.0 - 15.0 g/dL Final  . HCT 08/28/2018 37.6  36.0 - 46.0 % Final  . MCV 08/28/2018 98.4  80.0 - 100.0 fL Final  . MCH 08/28/2018 29.8  26.0 - 34.0 pg Final  . MCHC 08/28/2018 30.3  30.0 - 36.0 g/dL Final  . RDW 08/28/2018 18.1* 11.5 - 15.5 % Final  . Platelets 08/28/2018 305  150 - 400 K/uL Final  . nRBC 08/28/2018 0.0  0.0 - 0.2 % Final  . Neutrophils Relative % 08/28/2018 77  % Final  . Neutro Abs 08/28/2018 5.8  1.7 - 7.7 K/uL Final  . Lymphocytes Relative 08/28/2018 16  % Final  . Lymphs Abs 08/28/2018 1.2  0.7 - 4.0 K/uL Final  . Monocytes Relative 08/28/2018 7  % Final  . Monocytes Absolute 08/28/2018 0.5  0.1 - 1.0 K/uL Final  . Eosinophils Relative 08/28/2018 0  % Final  . Eosinophils Absolute 08/28/2018 0.0  0.0 - 0.5 K/uL Final  . Basophils Relative 08/28/2018 0  % Final  . Basophils Absolute 08/28/2018 0.0  0.0 - 0.1 K/uL Final  . Immature Granulocytes 08/28/2018 0  % Final  . Abs Immature Granulocytes 08/28/2018 0.02  0.00 - 0.07 K/uL Final   Performed at [facility], [address] [place], [place] [address]  . Sodium 08/28/2018 138  135 - 145 mmol/L Final  . Potassium 08/28/2018 4.2  3.5 - 5.1 mmol/L Final  . Chloride 08/28/2018 104  98 - 111 mmol/L Final  . CO2 08/28/2018 27  22 - 32 mmol/L Final  . Glucose, Bld 08/28/2018 103* 70 - 99 mg/dL Final  . BUN 08/28/2018 12  8 - 23 mg/dL Final  . Creatinine, Ser 08/28/2018 0.52  0.44 - 1.00 mg/dL Final  . Calcium 08/28/2018 8.8* 8.9 - 10.3 mg/dL Final  . Total  Protein 08/28/2018 6.7  6.5 - 8.1 g/dL Final  . Albumin 08/28/2018 3.4* 3.5 - 5.0 g/dL Final  . AST 08/28/2018 17  15 - 41 U/L Final  . ALT 08/28/2018 12  0 - 44 U/L Final  . Alkaline Phosphatase 08/28/2018 62  38 - 126 U/L Final  . Total Bilirubin 08/28/2018 0.5  0.3 - 1.2 mg/dL Final  . GFR calc non Af Amer 08/28/2018 >60  >60 mL/min Final  . GFR calc Af Amer 08/28/2018 >60  >60 mL/min Final  . Anion gap 08/28/2018 7  5 - 15 Final   Performed at [facility], [address], [place], [place] [address]     Pathology Orders Placed This Encounter  Procedures  . CBC with Differential/Platelet    Standing Status:   Future    Standing Expiration Date:   08/29/2019  . Comprehensive metabolic panel    Standing Status:   Future    Standing Expiration Date:   08/29/2019  . Lactate dehydrogenase    Standing Status:   Future    Standing Expiration Date:   08/29/2019       Zoila Shutter MD

## 2018-08-28 NOTE — Progress Notes (Signed)
Pt presents today for Day 1 Cycle 5 q 21 days for Toxotere/Perjeta/ Herceptin. BP low. Asymptomatic. MAR reviewed with family at the bedside due to pt has dementia. Family at the bedside. 2D echo 60/65% 07/25/2018. Consent for chemo signed and documented on 06/05/2018.   Dr. Walden Field at the bedside. Assessment performed. Proceed with treatment per MD. Pt has rash on upper back and shoulders. Labs reviewed with family at bedside by Dr. Walden Field. BP reported. No new orders received.   Treatment given today per MD orders. Tolerated infusion without adverse affects. Vital signs stable. No complaints at this time. Discharged from clinic ambulatory. F/U with [facility] as scheduled. Kelly from the [facility] picked pt up and ambulated off the floor. Stable. Pt has Neosporin and Carmex at the bedside per Dr. Walden Field suggestion to family to purchase.

## 2018-08-30 ENCOUNTER — Other Ambulatory Visit (HOSPITAL_COMMUNITY): Payer: Medicaid Other

## 2018-08-30 ENCOUNTER — Inpatient Hospital Stay (HOSPITAL_COMMUNITY): Payer: Medicaid Other

## 2018-08-30 ENCOUNTER — Encounter (HOSPITAL_COMMUNITY): Payer: Self-pay

## 2018-08-30 VITALS — BP 98/50 | HR 93 | Temp 98.2°F | Resp 18

## 2018-08-30 DIAGNOSIS — Z5112 Encounter for antineoplastic immunotherapy: Secondary | ICD-10-CM | POA: Diagnosis not present

## 2018-08-30 DIAGNOSIS — C50812 Malignant neoplasm of overlapping sites of left female breast: Secondary | ICD-10-CM

## 2018-08-30 DIAGNOSIS — Z171 Estrogen receptor negative status [ER-]: Principal | ICD-10-CM

## 2018-08-30 MED ORDER — PEGFILGRASTIM-CBQV 6 MG/0.6ML ~~LOC~~ SOSY
6.0000 mg | PREFILLED_SYRINGE | Freq: Once | SUBCUTANEOUS | Status: AC
  Administered 2018-08-30: 6 mg via SUBCUTANEOUS

## 2018-08-30 MED ORDER — PEGFILGRASTIM-CBQV 6 MG/0.6ML ~~LOC~~ SOSY
PREFILLED_SYRINGE | SUBCUTANEOUS | Status: AC
  Filled 2018-08-30: qty 0.6

## 2018-08-30 NOTE — Progress Notes (Signed)
Patient tolerated injection with no complaints voiced.  Site clean and dry.  Band aid applied.  VSs with discharge and left ambulatory with no s/s of distress noted.

## 2018-08-30 NOTE — Patient Instructions (Signed)
[facility]  Discharge Instructions:   _______________________________________________________________  Thank you for choosing [facility] to provide your oncology and hematology care.  To afford each patient quality time with our providers, please arrive at least 15 minutes before your scheduled appointment.  You need to re-schedule your appointment if you arrive 10 or more minutes late.  We strive to give you quality time with our providers, and arriving late affects you and other patients whose appointments are after yours.  Also, if you no show three or more times for appointments you may be dismissed from the clinic.  Again, thank you for choosing [facility]. Our hope is that these requests will allow you access to exceptional care and in a timely manner. _______________________________________________________________  If you have questions after your visit, please contact our office at [phone] between the hours of 8:30 a.m. and 5:00 p.m. Voicemails left after 4:30 p.m. will not be returned until the following business day. _______________________________________________________________  For prescription refill requests, have your pharmacy contact our office. _______________________________________________________________  Recommendations made by the consultant and any test results will be sent to your referring physician. _______________________________________________________________ 

## 2018-09-18 ENCOUNTER — Inpatient Hospital Stay (HOSPITAL_BASED_OUTPATIENT_CLINIC_OR_DEPARTMENT_OTHER): Payer: Medicaid Other | Admitting: Internal Medicine

## 2018-09-18 ENCOUNTER — Inpatient Hospital Stay (HOSPITAL_COMMUNITY): Payer: Medicaid Other

## 2018-09-18 ENCOUNTER — Other Ambulatory Visit: Payer: Self-pay

## 2018-09-18 ENCOUNTER — Encounter (HOSPITAL_COMMUNITY): Payer: Self-pay | Admitting: Internal Medicine

## 2018-09-18 VITALS — BP 143/81 | HR 68 | Temp 98.4°F | Resp 18

## 2018-09-18 VITALS — BP 109/65 | HR 82 | Temp 97.9°F | Resp 16 | Wt 154.3 lb

## 2018-09-18 DIAGNOSIS — C50912 Malignant neoplasm of unspecified site of left female breast: Secondary | ICD-10-CM

## 2018-09-18 DIAGNOSIS — C773 Secondary and unspecified malignant neoplasm of axilla and upper limb lymph nodes: Secondary | ICD-10-CM | POA: Diagnosis not present

## 2018-09-18 DIAGNOSIS — Z171 Estrogen receptor negative status [ER-]: Principal | ICD-10-CM

## 2018-09-18 DIAGNOSIS — F0390 Unspecified dementia without behavioral disturbance: Secondary | ICD-10-CM

## 2018-09-18 DIAGNOSIS — C7801 Secondary malignant neoplasm of right lung: Secondary | ICD-10-CM | POA: Diagnosis not present

## 2018-09-18 DIAGNOSIS — C50812 Malignant neoplasm of overlapping sites of left female breast: Secondary | ICD-10-CM

## 2018-09-18 DIAGNOSIS — Z5112 Encounter for antineoplastic immunotherapy: Secondary | ICD-10-CM | POA: Diagnosis not present

## 2018-09-18 DIAGNOSIS — R110 Nausea: Secondary | ICD-10-CM

## 2018-09-18 LAB — CBC WITH DIFFERENTIAL/PLATELET
Abs Immature Granulocytes: 0.02 10*3/uL (ref 0.00–0.07)
BASOS ABS: 0 10*3/uL (ref 0.0–0.1)
Basophils Relative: 1 %
EOS PCT: 1 %
Eosinophils Absolute: 0 10*3/uL (ref 0.0–0.5)
HCT: 37.6 % (ref 36.0–46.0)
Hemoglobin: 11.5 g/dL — ABNORMAL LOW (ref 12.0–15.0)
Immature Granulocytes: 0 %
Lymphocytes Relative: 22 %
Lymphs Abs: 1.4 10*3/uL (ref 0.7–4.0)
MCH: 31.1 pg (ref 26.0–34.0)
MCHC: 30.6 g/dL (ref 30.0–36.0)
MCV: 101.6 fL — ABNORMAL HIGH (ref 80.0–100.0)
Monocytes Absolute: 0.5 10*3/uL (ref 0.1–1.0)
Monocytes Relative: 8 %
NRBC: 0 % (ref 0.0–0.2)
Neutro Abs: 4.4 10*3/uL (ref 1.7–7.7)
Neutrophils Relative %: 68 %
Platelets: 255 10*3/uL (ref 150–400)
RBC: 3.7 MIL/uL — ABNORMAL LOW (ref 3.87–5.11)
RDW: 17.1 % — ABNORMAL HIGH (ref 11.5–15.5)
WBC: 6.4 10*3/uL (ref 4.0–10.5)

## 2018-09-18 LAB — COMPREHENSIVE METABOLIC PANEL
ALT: 10 U/L (ref 0–44)
AST: 15 U/L (ref 15–41)
Albumin: 3.4 g/dL — ABNORMAL LOW (ref 3.5–5.0)
Alkaline Phosphatase: 63 U/L (ref 38–126)
Anion gap: 8 (ref 5–15)
BUN: 11 mg/dL (ref 8–23)
CO2: 29 mmol/L (ref 22–32)
Calcium: 9 mg/dL (ref 8.9–10.3)
Chloride: 106 mmol/L (ref 98–111)
Creatinine, Ser: 0.51 mg/dL (ref 0.44–1.00)
GFR calc Af Amer: >60 mL/min (ref >60)
GFR calc non Af Amer: >60 mL/min (ref >60)
Glucose, Bld: 99 mg/dL (ref 70–99)
Potassium: 4.2 mmol/L (ref 3.5–5.1)
Sodium: 143 mmol/L (ref 135–145)
TOTAL PROTEIN: 6.5 g/dL (ref 6.5–8.1)
Total Bilirubin: 0.4 mg/dL (ref 0.3–1.2)

## 2018-09-18 LAB — LACTATE DEHYDROGENASE: LDH: 143 U/L (ref 98–192)

## 2018-09-18 MED ORDER — SODIUM CHLORIDE 0.9% FLUSH
10.0000 mL | INTRAVENOUS | Status: DC | PRN
  Administered 2018-09-18: 10 mL
  Filled 2018-09-18: qty 10

## 2018-09-18 MED ORDER — SODIUM CHLORIDE 0.9 % IV SOLN
Freq: Once | INTRAVENOUS | Status: AC
  Administered 2018-09-18: 10:00:00 via INTRAVENOUS

## 2018-09-18 MED ORDER — ACETAMINOPHEN 325 MG PO TABS
650.0000 mg | ORAL_TABLET | Freq: Once | ORAL | Status: AC
  Administered 2018-09-18: 650 mg via ORAL

## 2018-09-18 MED ORDER — SODIUM CHLORIDE 0.9 % IV SOLN
420.0000 mg | Freq: Once | INTRAVENOUS | Status: AC
  Administered 2018-09-18: 420 mg via INTRAVENOUS
  Filled 2018-09-18: qty 14

## 2018-09-18 MED ORDER — HEPARIN SOD (PORK) LOCK FLUSH 100 UNIT/ML IV SOLN
500.0000 [IU] | Freq: Once | INTRAVENOUS | Status: AC | PRN
  Administered 2018-09-18: 500 [IU]

## 2018-09-18 MED ORDER — ACETAMINOPHEN 325 MG PO TABS
ORAL_TABLET | ORAL | Status: AC
  Filled 2018-09-18: qty 2

## 2018-09-18 MED ORDER — FAMOTIDINE IN NACL 20-0.9 MG/50ML-% IV SOLN
20.0000 mg | Freq: Once | INTRAVENOUS | Status: AC
  Administered 2018-09-18: 20 mg via INTRAVENOUS

## 2018-09-18 MED ORDER — FAMOTIDINE IN NACL 20-0.9 MG/50ML-% IV SOLN
INTRAVENOUS | Status: AC
  Filled 2018-09-18: qty 50

## 2018-09-18 MED ORDER — TRASTUZUMAB CHEMO 150 MG IV SOLR
450.0000 mg | Freq: Once | INTRAVENOUS | Status: AC
  Administered 2018-09-18: 450 mg via INTRAVENOUS
  Filled 2018-09-18: qty 21.43

## 2018-09-18 MED ORDER — SODIUM CHLORIDE 0.9 % IV SOLN
75.0000 mg/m2 | Freq: Once | INTRAVENOUS | Status: AC
  Administered 2018-09-18: 140 mg via INTRAVENOUS
  Filled 2018-09-18: qty 14

## 2018-09-18 MED ORDER — SODIUM CHLORIDE 0.9 % IV SOLN
Freq: Once | INTRAVENOUS | Status: AC
  Administered 2018-09-18: 12:00:00 via INTRAVENOUS
  Filled 2018-09-18: qty 4

## 2018-09-18 MED ORDER — DIPHENHYDRAMINE HCL 25 MG PO CAPS
ORAL_CAPSULE | ORAL | Status: AC
  Filled 2018-09-18: qty 2

## 2018-09-18 MED ORDER — DIPHENHYDRAMINE HCL 25 MG PO CAPS
50.0000 mg | ORAL_CAPSULE | Freq: Once | ORAL | Status: AC
  Administered 2018-09-18: 50 mg via ORAL

## 2018-09-18 NOTE — Progress Notes (Signed)
Patient seen by Dr. Walden Field with lab review and ok to treat today.  Patient tolerated chemotherapy with no complaints voiced.  Port site clean and dry with no bruising or swelling noted at site.  Good blood return noted before and after administration of chemotherapy.  Band aid applied.  Patient left ambulatory with VSS and no s/s of distress noted.

## 2018-09-18 NOTE — Patient Instructions (Signed)
[facility] Discharge Instructions for Patients Receiving Chemotherapy  Today you received the following chemotherapy agents  If you develop nausea and vomiting that is not controlled by your nausea medication, call the clinic.   BELOW ARE SYMPTOMS THAT SHOULD BE REPORTED IMMEDIATELY:  *FEVER GREATER THAN 100.5 F  *CHILLS WITH OR WITHOUT FEVER  NAUSEA AND VOMITING THAT IS NOT CONTROLLED WITH YOUR NAUSEA MEDICATION  *UNUSUAL SHORTNESS OF BREATH  *UNUSUAL BRUISING OR BLEEDING  TENDERNESS IN MOUTH AND THROAT WITH OR WITHOUT PRESENCE OF ULCERS  *URINARY PROBLEMS  *BOWEL PROBLEMS  UNUSUAL RASH Items with * indicate a potential emergency and should be followed up as soon as possible.  Feel free to call the clinic should you have any questions or concerns. The clinic phone number is [phone].  Please show the CHEMO ALERT CARD at check-in to the Emergency Department and triage nurse.   

## 2018-09-18 NOTE — Progress Notes (Signed)
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  Assessment and Plan:   1.  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 - PET/CT scan on 05/11/2018 shows bilateral hypermetabolic pulmonary nodules consistent with pulmonary metastasis with no metastatic disease outside of the thorax. - On 05/23/2018, lung biopsy shows poorly differentiated metastatic carcinoma, consistent with breast primary. - Echocardiogram on 05/03/2018 shows ejection fraction of 60 to 65%.  - Patient is a resident at [facility] in [place] for the last few years secondary to dementia.  Her healthcare proxy was her Sister Neoma Laming who lives at [employer].  Pt is being treated for advanced breast cancer  in the palliative setting. - Pt is seen today for evaluation prior to C6 of Taxotere dosed at 75 mg/m2, Herceptin 6 mg/kg and Perjeta 420 mg IV every 3 weeks for 6 cycles.  PT will be set up for repeat imaging in 2 weeks after 6 cycles and will RTC to go over results.  Mass in left breast is improved on evaluation today.  Sister understands goal of therapy and has given consent to treat.    Labs done 1/272/2020 reviewed and showed WBC 6.4 HB 11.5 plts 255,000.  Chemistries WNL with K+ 4.2 Cr 0.51 and normal LFTs.  Echo done 07/25/2018 showed EF 60-65%.  Pt will proceed with C6 today and will be seen for follow-up in 3 weeks to go over PET scan.     Previously, option of genetic testing as she has 2 young daughters discussed with sister Neoma Laming and she does not presently desire pt to undergo  testing.    2.  Cellulitis/lip dryness.  Pt reports symptoms have improved.  Pt was treated with bactrim.  Carmex lip ointment recommended.  Option also for abreva lip care if fever blisters or cold sores suspected.  Pt should also have evaluation by NH physician for any worsening of symptoms.    3.  Dementia.  Sister reports this affected several family members.  Pt is alert and conversant.  She is currently in [facility].  Sister is Marine scientist.   4.  Nausea.  NH to continue nausea regimen.   5.  Health maintenance.  Pt was not getting mammograms since being in facility.  She is not sure when last colonoscopy was done.    25 minutes spent with more than 50% spent in counseling and coordination of care.    Interval history:  Historical data obtained from note dated 05/16/2018.   63 year old female referred for evaluation due to breast cancer.  She is accompanied by nursing home worker.  Patient reportedly has been in nursing facility since September 2018.  She reportedly was diagnosed with dementia which reportedly runs in her family.  Power of attorney is available via phone.  The patient reportedly noted an area in the left breast and brought that to the facility's attention.       She subsequently had bilateral diagnostic mammogram done on 04/04/2018 that showed  IMPRESSION: 1. Highly suspicious left breast mass with associated calcifications measuring 4.6 cm sonographically.  2. Suspicious lymph node in the left  axilla with focal nodular cortical thickening.  3.  No findings of malignancy in the right breast.  RECOMMENDATION: 1. Recommend ultrasound-guided biopsy of the palpable mass in the left breast at the 1 to 2 o'clock position.  2. Recommend ultrasound-guided biopsy of the abnormal lymph node in the left axilla.     Patient underwent ultrasound-guided biopsy of the left breast on 04/11/2018.  Clip was placed. Pathology returned as : ADDITIONAL INFORMATION: 1.  Immunohistochemical stain for E-cadherin is positive, consistent with a ductal phenotype. Jaquita Folds MD Pathologist, Electronic Signature ( Signed 04/14/2018) 1. PROGNOSTIC INDICATORS Results: IMMUNOHISTOCHEMICAL AND MORPHOMETRIC ANALYSIS PERFORMED MANUALLY The tumor cells are Positive for Her2 (3+). Estrogen Receptor: 0%, NEGATIVE Progesterone Receptor: 0%, NEGATIVE Proliferation Marker Ki67: 50% REFERENCE RANGE ESTROGEN RECEPTOR NEGATIVE 0% POSITIVE =>1% REFERENCE RANGE PROGESTERONE RECEPTOR NEGATIVE 0% POSITIVE =>1% All controls stained appropriately Vicente Males MD Pathologist, Electronic Signature ( Signed 04/14/2018) FINAL DIAGNOSIS 1 of 3 FINAL for [place], Kimanh ([id number]) Diagnosis 1. Breast, left, needle core biopsy, 2:00 - INVASIVE MAMMARY CARCINOMA. SEE NOTE. 2. Lymph node, needle/core biopsy, left axilla - INVOLVED BY MAMMARY CARCINOMA   Current Status:  Pt is seen today for follow-up prior to C6 of THP.  She reports she is doing well and tolerating therapy.      Malignant neoplasm of overlapping sites of left female breast ([place])   05/15/2018 Initial Diagnosis    Malignant neoplasm of overlapping sites of left female breast ([place])    06/05/2018 -  Chemotherapy    The patient had pegfilgrastim-cbqv (UDENYCA) injection 6 mg, 6 mg, Subcutaneous, Once, 6 of 6 cycles Administration: 6 mg (06/07/2018), 6 mg (06/28/2018), 6 mg (07/19/2018), 6 mg (08/30/2018), 6 mg (08/10/2018) trastuzumab (HERCEPTIN) 600 mg in sodium chloride 0.9 % 250 mL chemo infusion, 609 mg, Intravenous,  Once, 6 of 6 cycles Administration: 600 mg (06/05/2018), 450 mg (06/26/2018), 450 mg (07/17/2018), 450 mg (08/28/2018), 450 mg (08/07/2018) DOCEtaxel (TAXOTERE) 140 mg in sodium chloride 0.9 % 250 mL chemo infusion, 75 mg/m2 = 140 mg, Intravenous,  Once, 6 of 6 cycles Administration: 140 mg (06/05/2018), 140 mg (06/26/2018), 140 mg (07/17/2018), 140 mg (08/28/2018), 140 mg (08/07/2018) ondansetron  (ZOFRAN) 8 mg, dexamethasone (DECADRON) 4 mg in sodium chloride 0.9 % 50 mL IVPB, , Intravenous,  Once, 6 of 6 cycles Administration:  (06/05/2018),  (06/26/2018),  (07/17/2018),  (08/28/2018),  (08/07/2018) pertuzumab (PERJETA) 840 mg in sodium chloride 0.9 % 250 mL chemo infusion, 840 mg, Intravenous, Once, 6 of 6 cycles Administration: 840 mg (06/05/2018), 420 mg (06/26/2018), 420 mg (07/17/2018), 420 mg (08/28/2018), 420 mg (08/07/2018)  for chemotherapy treatment.       Problem List Patient Active Problem List   Diagnosis Date Noted  . Goals of care, counseling/discussion [Z71.89] 05/30/2018  . 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 Malignant neoplasm of overlapping sites of left female breast ([place]) [C50.812]     Past Medical History Past Medical History:  Diagnosis Date  . Alzheimer's dementia ([place])   . Bradycardia   . Breast cancer ([place])   . Dementia ([place])   . Depression   . Hypokalemia   . Hypotension   . 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 ABDOMINAL HYSTERECTOMY    . BREAST BIOPSY Left   . PORTACATH PLACEMENT Right 05/15/2018   Procedure: INSERTION PORT-A-CATH;  Surgeon: Aviva Signs, MD;  Location: [facility];  Service: General;  Laterality: Right;    Family History History reviewed. No pertinent family history.   Social History  has an unknown smoking status. She has never used smokeless tobacco. She reports previous alcohol use. She reports that she does not use drugs.  Medications  Current Outpatient Medications:  .  cholecalciferol (VITAMIN D) 1000 units tablet, Take 2,000 Units by mouth daily. , Disp: , Rfl:  .  divalproex (DEPAKOTE) 500 MG DR tablet, Take 500 mg by mouth daily. , Disp: , Rfl:  .  DOCEtaxel (TAXOTERE IV), Inject into the vein., Disp: , Rfl:  .  lidocaine-prilocaine (EMLA) cream, Apply to affected area  once, Disp: 30 g, Rfl: 3 .  pegfilgrastim-cbqv (UDENYCA) 6 MG/0.6ML injection, Inject 6 mg into the skin once., Disp: , Rfl:  .  Pertuzumab (PERJETA IV), Inject into the vein., Disp: , Rfl:  .  Trastuzumab (HERCEPTIN IV), Inject into the vein., Disp: , Rfl:  .  acetaminophen (TYLENOL) 325 MG tablet, Take 650 mg by mouth every 6 (six) hours as needed for mild pain., Disp: , Rfl:  .  ALPRAZolam (XANAX) 0.25 MG tablet, Take 0.25 mg by mouth every 8 (eight) hours as needed for anxiety. , Disp: , Rfl:  .  memantine (NAMENDA) 10 MG tablet, Take 10 mg by mouth 2 (two) times daily., Disp: , Rfl:  .  ondansetron (ZOFRAN) 4 MG tablet, Take 4 mg by mouth every 8 (eight) hours as needed for nausea or vomiting., Disp: , Rfl:  .  prochlorperazine (COMPAZINE) 10 MG tablet, Take 1 tablet (10 mg total) by mouth every 6 (six) hours as needed (Nausea or vomiting). (Patient not taking: Reported on 09/18/2018), Disp: 30 tablet, Rfl: 1 .  QUEtiapine (SEROQUEL) 25 MG tablet, Take 12.5 mg by mouth 3 (three) times daily., Disp: , Rfl:  .  sertraline (ZOLOFT) 100 MG tablet, Take 150 mg by mouth daily. , Disp: , Rfl:  No current facility-administered medications for this visit.   Facility-Administered Medications Ordered in Other Visits:  .  DOCEtaxel (TAXOTERE) 140 mg in sodium chloride 0.9 % 250 mL chemo infusion, 75 mg/m2 (Treatment Plan Recorded), Intravenous, Once, Lowanda Cashaw, MD .  heparin lock flush 100 unit/mL, 500 Units, Intracatheter, Once PRN, Slevin Gunby, Mathis Dad, MD .  pertuzumab (PERJETA) 420 mg in sodium chloride 0.9 % 250 mL chemo infusion, 420 mg, Intravenous, Once, Jacklyn Branan, MD .  sodium chloride flush (NS) 0.9 % injection 10 mL, 10 mL, Intracatheter, PRN, Skarleth Delmonico, MD, 10 mL at 07/17/18 0929 .  sodium chloride flush (NS) 0.9 % injection 10 mL, 10 mL, Intracatheter, PRN, Allexa Acoff, MD, 10 mL at 09/18/18 1021 .  trastuzumab (HERCEPTIN) 450 mg in sodium chloride 0.9 % 250 mL chemo infusion, 450 mg,  Intravenous, Once, Donicia Druck, Mathis Dad, MD  Allergies Patient has no known allergies.  Review of Systems Review of Systems - Oncology ROS negative   Physical Exam  Vitals Wt Readings from Last 3 Encounters:  09/18/18 154 lb 5 oz (70 kg)  08/28/18 154 lb 6.4 oz (70 kg)  08/07/18 152 lb 4.8 oz (69.1 kg)   Temp Readings from Last 3 Encounters:  09/18/18 97.9 F (36.6 C) (Oral)  08/30/18 98.2 F (36.8 C) (Oral)  08/28/18 97.6 F (36.4 C) (Oral)   BP Readings from Last 3 Encounters:  09/18/18 109/65  08/30/18 (!) 98/50  08/28/18 104/60   Pulse Readings from Last 3 Encounters:  09/18/18 82  08/30/18 93  08/28/18 69  Constitutional: Well-developed, well-nourished, and in no distress.   HENT: Head: Normocephalic and atraumatic.  Mouth/Throat: No oropharyngeal exudate. Mucosa moist. Eyes: Pupils are equal, round, and reactive to light. Conjunctivae are normal. No scleral icterus.  Neck: Normal range of motion. Neck supple. No JVD present.  Cardiovascular: Normal rate, regular rhythm and normal heart sounds.  Exam reveals no gallop and no friction rub.   No murmur heard. Pulmonary/Chest: Effort normal and breath sounds normal. No respiratory distress. No wheezes.No rales.  Abdominal: Soft. Bowel sounds are normal. No distension. There is no tenderness. There is no guarding.  Musculoskeletal: No edema or tenderness.  Lymphadenopathy: No cervical, axillary or supraclavicular adenopathy.  Neurological: Alert and oriented to person, place, and time. No cranial nerve deficit.  Skin: Skin is warm and dry. No rash noted. No erythema. No pallor.  Psychiatric: Affect and judgment normal.  Breast exam:  Chaperone present.  Mass in left breast improved, measures  <1 cm today.  No dominant masses palpable in right breast.    Labs Appointment on 09/18/2018  Component Date Value Ref Range Status  . WBC 09/18/2018 6.4  4.0 - 10.5 K/uL Final  . RBC 09/18/2018 3.70* 3.87 - 5.11 MIL/uL Final   . Hemoglobin 09/18/2018 11.5* 12.0 - 15.0 g/dL Final  . HCT 09/18/2018 37.6  36.0 - 46.0 % Final  . MCV 09/18/2018 101.6* 80.0 - 100.0 fL Final  . MCH 09/18/2018 31.1  26.0 - 34.0 pg Final  . MCHC 09/18/2018 30.6  30.0 - 36.0 g/dL Final  . RDW 09/18/2018 17.1* 11.5 - 15.5 % Final  . Platelets 09/18/2018 255  150 - 400 K/uL Final  . nRBC 09/18/2018 0.0  0.0 - 0.2 % Final  . Neutrophils Relative % 09/18/2018 68  % Final  . Neutro Abs 09/18/2018 4.4  1.7 - 7.7 K/uL Final  . Lymphocytes Relative 09/18/2018 22  % Final  . Lymphs Abs 09/18/2018 1.4  0.7 - 4.0 K/uL Final  . Monocytes Relative 09/18/2018 8  % Final  . Monocytes Absolute 09/18/2018 0.5  0.1 - 1.0 K/uL Final  . Eosinophils Relative 09/18/2018 1  % Final  . Eosinophils Absolute 09/18/2018 0.0  0.0 - 0.5 K/uL Final  . Basophils Relative 09/18/2018 1  % Final  . Basophils Absolute 09/18/2018 0.0  0.0 - 0.1 K/uL Final  . Immature Granulocytes 09/18/2018 0  % Final  . Abs Immature Granulocytes 09/18/2018 0.02  0.00 - 0.07 K/uL Final   Performed at [facility], [address] [place], [place] [address]  . Sodium 09/18/2018 143  135 - 145 mmol/L Final  . Potassium 09/18/2018 4.2  3.5 - 5.1 mmol/L Final  . Chloride 09/18/2018 106  98 - 111 mmol/L Final  . CO2 09/18/2018 29  22 - 32 mmol/L Final  . Glucose, Bld 09/18/2018 99  70 - 99 mg/dL Final  . BUN 09/18/2018 11  8 - 23 mg/dL Final  . Creatinine, Ser 09/18/2018 0.51  0.44 - 1.00 mg/dL Final  . Calcium 09/18/2018 9.0  8.9 - 10.3 mg/dL Final  . Total Protein 09/18/2018 6.5  6.5 - 8.1 g/dL Final  . Albumin 09/18/2018 3.4* 3.5 - 5.0 g/dL Final  . AST 09/18/2018 15  15 - 41 U/L Final  . ALT 09/18/2018 10  0 - 44 U/L Final  . Alkaline Phosphatase 09/18/2018 63  38 - 126 U/L Final  . Total Bilirubin 09/18/2018 0.4  0.3 - 1.2 mg/dL Final  . GFR calc non Af  Amer 09/18/2018 >60  >60 mL/min Final  . GFR calc Af Amer 09/18/2018 >60  >60 mL/min Final  . Anion gap 09/18/2018 8  5 - 15  Final   Performed at [facility], [address], [place], [place] [address]  . LDH 09/18/2018 143  98 - 192 U/L Final   Performed at [facility], [address] [place], [place] [address]     Pathology Orders Placed This Encounter  Procedures  . NM PET Image Restag (PS) Skull Base To Thigh    Standing Status:   Future    Standing Expiration Date:   09/18/2019    Order Specific Question:   If indicated for the ordered procedure, I authorize the administration of a radiopharmaceutical per Radiology protocol    Answer:   Yes    Order Specific Question:   Preferred imaging location?    Answer:   [facility]    Order Specific Question:   Radiology Contrast Protocol - do NOT remove file path    Answer:   [email or web address]  . CBC with Differential/Platelet    Standing Status:   Future    Standing Expiration Date:   09/19/2019  . Comprehensive metabolic panel    Standing Status:   Future    Standing Expiration Date:   09/19/2019  . Lactate dehydrogenase    Standing Status:   Future    Standing Expiration Date:   09/19/2019       Zoila Shutter MD

## 2018-09-20 ENCOUNTER — Inpatient Hospital Stay (HOSPITAL_COMMUNITY): Payer: Medicaid Other

## 2018-09-20 VITALS — BP 111/58 | HR 90 | Temp 97.8°F | Resp 16

## 2018-09-20 DIAGNOSIS — C50812 Malignant neoplasm of overlapping sites of left female breast: Secondary | ICD-10-CM

## 2018-09-20 DIAGNOSIS — Z171 Estrogen receptor negative status [ER-]: Principal | ICD-10-CM

## 2018-09-20 DIAGNOSIS — Z5112 Encounter for antineoplastic immunotherapy: Secondary | ICD-10-CM | POA: Diagnosis not present

## 2018-09-20 MED ORDER — PEGFILGRASTIM-CBQV 6 MG/0.6ML ~~LOC~~ SOSY
PREFILLED_SYRINGE | SUBCUTANEOUS | Status: AC
  Filled 2018-09-20: qty 0.6

## 2018-09-20 MED ORDER — PEGFILGRASTIM-CBQV 6 MG/0.6ML ~~LOC~~ SOSY
6.0000 mg | PREFILLED_SYRINGE | Freq: Once | SUBCUTANEOUS | Status: AC
  Administered 2018-09-20: 6 mg via SUBCUTANEOUS

## 2018-09-20 NOTE — Progress Notes (Signed)
Pt here today for Udenyca injection. Pt given injection in left arm. Pt tolerated injection well. Pt stable and discharged home ambulatory with caregiver. Pt to return as scheduled for next follow up and treatment.

## 2018-09-29 ENCOUNTER — Ambulatory Visit (HOSPITAL_COMMUNITY)
Admission: RE | Admit: 2018-09-29 | Discharge: 2018-09-29 | Disposition: A | Discharge status: Home / Self Care | Payer: Medicaid Other | Source: Ambulatory Visit | Attending: Internal Medicine | Admitting: Internal Medicine

## 2018-09-29 DIAGNOSIS — C50912 Malignant neoplasm of unspecified site of left female breast: Secondary | ICD-10-CM | POA: Insufficient documentation

## 2018-09-29 LAB — GLUCOSE, CAPILLARY: Glucose-Capillary: 89 mg/dL (ref 70–99)

## 2018-09-29 MED ORDER — FLUDEOXYGLUCOSE F - 18 (FDG) INJECTION
7.7000 | Freq: Once | INTRAVENOUS | Status: AC
  Administered 2018-09-29: 7.7 via INTRAVENOUS

## 2018-10-04 ENCOUNTER — Other Ambulatory Visit (HOSPITAL_COMMUNITY): Payer: Self-pay | Admitting: Pharmacist

## 2018-10-04 ENCOUNTER — Inpatient Hospital Stay (HOSPITAL_COMMUNITY): Payer: Medicaid Other | Attending: Internal Medicine | Admitting: Internal Medicine

## 2018-10-04 ENCOUNTER — Inpatient Hospital Stay (HOSPITAL_COMMUNITY): Payer: Medicaid Other

## 2018-10-04 ENCOUNTER — Encounter (HOSPITAL_COMMUNITY): Payer: Self-pay | Admitting: Internal Medicine

## 2018-10-04 ENCOUNTER — Other Ambulatory Visit: Payer: Self-pay

## 2018-10-04 VITALS — BP 101/72 | HR 79 | Temp 98.4°F | Resp 18 | Wt 152.6 lb

## 2018-10-04 DIAGNOSIS — F0390 Unspecified dementia without behavioral disturbance: Secondary | ICD-10-CM | POA: Diagnosis not present

## 2018-10-04 DIAGNOSIS — C773 Secondary and unspecified malignant neoplasm of axilla and upper limb lymph nodes: Secondary | ICD-10-CM | POA: Insufficient documentation

## 2018-10-04 DIAGNOSIS — C50812 Malignant neoplasm of overlapping sites of left female breast: Secondary | ICD-10-CM

## 2018-10-04 DIAGNOSIS — Z5112 Encounter for antineoplastic immunotherapy: Secondary | ICD-10-CM | POA: Insufficient documentation

## 2018-10-04 DIAGNOSIS — L03116 Cellulitis of left lower limb: Secondary | ICD-10-CM | POA: Diagnosis not present

## 2018-10-04 DIAGNOSIS — L03115 Cellulitis of right lower limb: Secondary | ICD-10-CM | POA: Insufficient documentation

## 2018-10-04 DIAGNOSIS — C50912 Malignant neoplasm of unspecified site of left female breast: Secondary | ICD-10-CM

## 2018-10-04 DIAGNOSIS — Z171 Estrogen receptor negative status [ER-]: Secondary | ICD-10-CM | POA: Diagnosis not present

## 2018-10-04 DIAGNOSIS — C7801 Secondary malignant neoplasm of right lung: Secondary | ICD-10-CM | POA: Diagnosis not present

## 2018-10-04 LAB — CBC WITH DIFFERENTIAL/PLATELET
ABS IMMATURE GRANULOCYTES: 0.09 10*3/uL — AB (ref 0.00–0.07)
Basophils Absolute: 0 10*3/uL (ref 0.0–0.1)
Basophils Relative: 0 %
Eosinophils Absolute: 0 10*3/uL (ref 0.0–0.5)
Eosinophils Relative: 0 %
HCT: 38.3 % (ref 36.0–46.0)
HEMOGLOBIN: 11.6 g/dL — AB (ref 12.0–15.0)
Immature Granulocytes: 1 %
LYMPHS PCT: 18 %
Lymphs Abs: 1.3 10*3/uL (ref 0.7–4.0)
MCH: 29.9 pg (ref 26.0–34.0)
MCHC: 30.3 g/dL (ref 30.0–36.0)
MCV: 98.7 fL (ref 80.0–100.0)
Monocytes Absolute: 0.3 10*3/uL (ref 0.1–1.0)
Monocytes Relative: 4 %
NEUTROS ABS: 5.5 10*3/uL (ref 1.7–7.7)
Neutrophils Relative %: 77 %
Platelets: 192 10*3/uL (ref 150–400)
RBC: 3.88 MIL/uL (ref 3.87–5.11)
RDW: 16 % — ABNORMAL HIGH (ref 11.5–15.5)
WBC: 7.3 10*3/uL (ref 4.0–10.5)
nRBC: 0 % (ref 0.0–0.2)

## 2018-10-04 LAB — COMPREHENSIVE METABOLIC PANEL
ALT: 8 U/L (ref 0–44)
AST: 14 U/L — ABNORMAL LOW (ref 15–41)
Albumin: 3.5 g/dL (ref 3.5–5.0)
Alkaline Phosphatase: 59 U/L (ref 38–126)
Anion gap: 9 (ref 5–15)
BUN: 9 mg/dL (ref 8–23)
CO2: 27 mmol/L (ref 22–32)
Calcium: 9 mg/dL (ref 8.9–10.3)
Chloride: 104 mmol/L (ref 98–111)
Creatinine, Ser: 0.58 mg/dL (ref 0.44–1.00)
GFR calc Af Amer: >60 mL/min (ref >60)
GFR calc non Af Amer: >60 mL/min (ref >60)
Glucose, Bld: 110 mg/dL — ABNORMAL HIGH (ref 70–99)
Potassium: 4 mmol/L (ref 3.5–5.1)
Sodium: 140 mmol/L (ref 135–145)
Total Bilirubin: 0.2 mg/dL — ABNORMAL LOW (ref 0.3–1.2)
Total Protein: 6.5 g/dL (ref 6.5–8.1)

## 2018-10-04 LAB — LACTATE DEHYDROGENASE: LDH: 130 U/L (ref 98–192)

## 2018-10-04 NOTE — Patient Instructions (Signed)
[facility] at [facility] Discharge Instructions  You were seen by Dr. Higgs today   Thank you for choosing [facility] at [facility] to provide your oncology and hematology care.  To afford each patient quality time with our provider, please arrive at least 15 minutes before your scheduled appointment time.   If you have a lab appointment with the Cancer Center please come in thru the  Main Entrance and check in at the main information desk  You need to re-schedule your appointment should you arrive 10 or more minutes late.  We strive to give you quality time with our providers, and arriving late affects you and other patients whose appointments are after yours.  Also, if you no show three or more times for appointments you may be dismissed from the clinic at the providers discretion.     Again, thank you for choosing [facility].  Our hope is that these requests will decrease the amount of time that you wait before being seen by our physicians.       _____________________________________________________________  Should you have questions after your visit to  Cancer Center, please contact our office at [phone] between the hours of 8:00 a.m. and 4:30 p.m.  Voicemails left after 4:00 p.m. will not be returned until the following business day.  For prescription refill requests, have your pharmacy contact our office and allow 72 hours.    Cancer Center Support Programs:   > Cancer Support Group  2nd Tuesday of the month 1pm-2pm, Journey Room   

## 2018-10-04 NOTE — Progress Notes (Signed)
Diagnosis HER2-positive carcinoma of left breast ([place]) - Plan: CBC with Differential/Platelet, Comprehensive metabolic panel, Lactate dehydrogenase  Staging Cancer Staging No matching staging information was found for the patient.  Assessment and Plan:  1.  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 - PET/CT scan on 05/11/2018 shows bilateral hypermetabolic pulmonary nodules consistent with pulmonary metastasis with no metastatic disease outside of the thorax. - On 05/23/2018, lung biopsy shows poorly differentiated metastatic carcinoma, consistent with breast primary. - Echocardiogram on 05/03/2018 shows ejection fraction of 60 to 65%.  - Patient is a resident at [facility] in [place] for the last few years secondary to dementia.  Her healthcare proxy was her Sister Neoma Laming who lives at [employer].  Pt is being treated for advanced breast cancer  in the palliative setting. - Pt has completed C6 of Taxotere dosed at 75 mg/m2, Herceptin 6 mg/kg and Perjeta 420 mg IV every 3 weeks.  Pet scan done 09/29/2018 reviewed and showed  IMPRESSION: 1. Marked reduction in size and metabolic activity of bilateral pulmonary nodules. One nodule in the LEFT upper lobe has residual metabolic activity but less than blood pool activity. 2. Marked reduction in size and metabolic activity of LEFT breast mass. 3. Marked reduction size and metabolic activity of LEFT axillary nodes. Small lymph node does remain with background metabolic activity. 4. No evidence of progressive metastatic disease.  Breast lesion has gone from 4 cm to 2 cm.  Due to known metastatic disease, pt will continue Herceptin and Perjeta.  She will have repeat imaging in 12/2018.  Mass in left breast is improved on exam.   Sister understands goal of therapy and has given consent to treat.    Echo done 07/25/2018 showed EF 60-65%.  Pt will RTC in 1 week to resume Herceptin and Perjeta therapy.     Previously, option of genetic testing as she has 2 young daughters discussed with sister Neoma Laming and she does not presently desire pt to undergo testing.    2.  Cellulitis/lip dryness.  Pt reports symptoms have improved.  Pt was treated with bactrim.  Carmex lip ointment recommended.  Option also for abreva lip care if fever blisters or cold sores suspected.  Pt should also have evaluation by NH physician for any worsening of symptoms.   3.  Dementia.  Sister reports this affected several family members.  Pt is alert and conversant.  She is currently in [facility].  Sister is Marine scientist.   4.  Nausea.  Improved.  Nausea may resolve with discontinuation of Taxol.    5.  Health maintenance.  Pt was not getting mammograms since being in facility.  She is not sure when last colonoscopy was done.    25 minutes spent with more than 50% spent in counseling and coordination of care.    Interval history:  Historical data obtained from note dated 05/16/2018.   63 year old female referred for evaluation due to breast cancer.  She is accompanied by nursing home worker.  Patient reportedly has been in nursing facility since September 2018.  She reportedly was diagnosed with dementia which reportedly runs in her family.  Power of attorney is available via phone.  The patient reportedly noted an area in the left breast and brought that to the facility's attention.  She subsequently had bilateral diagnostic mammogram done on 04/04/2018 that showed  IMPRESSION: 1. Highly suspicious left breast mass with associated calcifications measuring 4.6 cm sonographically.  2. Suspicious lymph node in the left axilla with focal nodular cortical thickening.  3.  No findings of malignancy in the right breast.  RECOMMENDATION: 1.  Recommend ultrasound-guided biopsy of the palpable mass in the left breast at the 1 to 2 o'clock position.  2. Recommend ultrasound-guided biopsy of the abnormal lymph node in the left axilla.     Patient underwent ultrasound-guided biopsy of the left breast on 04/11/2018.  Clip was placed. Pathology returned as : ADDITIONAL INFORMATION: 1. Immunohistochemical stain for E-cadherin is positive, consistent with a ductal phenotype. Jaquita Folds MD Pathologist, Electronic Signature ( Signed 04/14/2018) 1. PROGNOSTIC INDICATORS Results: IMMUNOHISTOCHEMICAL AND MORPHOMETRIC ANALYSIS PERFORMED MANUALLY The tumor cells are Positive for Her2 (3+). Estrogen Receptor: 0%, NEGATIVE Progesterone Receptor: 0%, NEGATIVE Proliferation Marker Ki67: 50% REFERENCE RANGE ESTROGEN RECEPTOR NEGATIVE 0% POSITIVE =>1% REFERENCE RANGE PROGESTERONE RECEPTOR NEGATIVE 0% POSITIVE =>1% All controls stained appropriately Vicente Males MD Pathologist, Electronic Signature ( Signed 04/14/2018) FINAL DIAGNOSIS 1 of 3 FINAL for Lopeno, Cashae ([id number]) Diagnosis 1. Breast, left, needle core biopsy, 2:00 - INVASIVE MAMMARY CARCINOMA. SEE NOTE. 2. Lymph node, needle/core biopsy, left axilla - INVOLVED BY MAMMARY CARCINOMA   Current Status:  Pt is seen today for follow-up to go over scans.  She is accompanied by caretaker.      Malignant neoplasm of overlapping sites of left female breast ([place])   05/15/2018 Initial Diagnosis    Malignant neoplasm of overlapping sites of left female breast ([place])    06/05/2018 -  Chemotherapy    The patient had pegfilgrastim-cbqv (UDENYCA) injection 6 mg, 6 mg, Subcutaneous, Once, 6 of 6 cycles Administration: 6 mg (06/07/2018), 6 mg (06/28/2018), 6 mg (07/19/2018), 6 mg (08/30/2018), 6 mg (08/10/2018) trastuzumab (HERCEPTIN) 600 mg in sodium chloride 0.9 % 250 mL chemo infusion, 609 mg, Intravenous,  Once, 6 of 6 cycles Administration: 600 mg (06/05/2018), 450 mg  (06/26/2018), 450 mg (07/17/2018), 450 mg (08/28/2018), 450 mg (09/18/2018), 450 mg (08/07/2018) DOCEtaxel (TAXOTERE) 140 mg in sodium chloride 0.9 % 250 mL chemo infusion, 75 mg/m2 = 140 mg, Intravenous,  Once, 6 of 6 cycles Administration: 140 mg (06/05/2018), 140 mg (06/26/2018), 140 mg (07/17/2018), 140 mg (08/28/2018), 140 mg (09/18/2018), 140 mg (08/07/2018) ondansetron (ZOFRAN) 8 mg, dexamethasone (DECADRON) 4 mg in sodium chloride 0.9 % 50 mL IVPB, , Intravenous,  Once, 6 of 6 cycles Administration:  (06/05/2018),  (06/26/2018),  (07/17/2018),  (08/28/2018),  (09/18/2018),  (08/07/2018) pertuzumab (PERJETA) 840 mg in sodium chloride 0.9 % 250 mL chemo infusion, 840 mg, Intravenous, Once, 6 of 6 cycles Administration: 840 mg (06/05/2018), 420 mg (06/26/2018), 420 mg (07/17/2018), 420 mg (08/28/2018), 420 mg (09/18/2018), 420 mg (08/07/2018)  for chemotherapy treatment.       Problem List Patient Active Problem List   Diagnosis Date Noted  . Goals of care, counseling/discussion [Z71.89] 05/30/2018  . 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 Malignant neoplasm of overlapping sites of left female breast ([place]) [C50.812]     Past Medical History Past Medical History:  Diagnosis Date  . Alzheimer's dementia ([place])   . Bradycardia   . Breast cancer ([place])   . Dementia ([place])   . Depression   . Hypokalemia   . Hypotension   . 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 ABDOMINAL HYSTERECTOMY    . BREAST BIOPSY Left   . PORTACATH PLACEMENT Right 05/15/2018   Procedure: INSERTION PORT-A-CATH;  Surgeon: Aviva Signs, MD;  Location: [facility];  Service: General;  Laterality: Right;    Family History History reviewed. No pertinent family history.   Social History  has an unknown smoking status. She has never used smokeless tobacco. She reports previous alcohol use.  She reports that she does not use drugs.  Medications  Current Outpatient Medications:  .  acetaminophen (TYLENOL) 325 MG tablet, Take 650 mg by mouth every 6 (six) hours as needed for mild pain., Disp: , Rfl:  .  ALPRAZolam (XANAX) 0.25 MG tablet, Take 0.25 mg by mouth every 8 (eight) hours as needed for anxiety. , Disp: , Rfl:  .  cholecalciferol (VITAMIN D) 1000 units tablet, Take 2,000 Units by mouth daily. , Disp: , Rfl:  .  divalproex (DEPAKOTE) 500 MG DR tablet, Take 500 mg by mouth daily. , Disp: , Rfl:  .  DOCEtaxel (TAXOTERE IV), Inject into the vein., Disp: , Rfl:  .  lidocaine-prilocaine (EMLA) cream, Apply to affected area once, Disp: 30 g, Rfl: 3 .  memantine (NAMENDA) 10 MG tablet, Take 10 mg by mouth 2 (two) times daily., Disp: , Rfl:  .  ondansetron (ZOFRAN) 4 MG tablet, Take 4 mg by mouth every 8 (eight) hours as needed for nausea or vomiting., Disp: , Rfl:  .  pegfilgrastim-cbqv (UDENYCA) 6 MG/0.6ML injection, Inject 6 mg into the skin once., Disp: , Rfl:  .  Pertuzumab (PERJETA IV), Inject into the vein., Disp: , Rfl:  .  prochlorperazine (COMPAZINE) 10 MG tablet, Take 1 tablet (10 mg total) by mouth every 6 (six) hours as needed (Nausea or vomiting)., Disp: 30 tablet, Rfl: 1 .  QUEtiapine (SEROQUEL) 25 MG tablet, Take 12.5 mg by mouth 3 (three) times daily., Disp: , Rfl:  .  sertraline (ZOLOFT) 100 MG tablet, Take 150 mg by mouth daily. , Disp: , Rfl:  .  Trastuzumab (HERCEPTIN IV), Inject into the vein., Disp: , Rfl:  No current facility-administered medications for this visit.   Facility-Administered Medications Ordered in Other Visits:  .  sodium chloride flush (NS) 0.9 % injection 10 mL, 10 mL, Intracatheter, PRN, Ledford Goodson, MD, 10 mL at 07/17/18 9244  Allergies Patient has no known allergies.  Review of Systems Review of Systems - Oncology ROS negative   Physical Exam  Vitals Wt Readings from Last 3 Encounters:  10/04/18 152 lb 9.6 oz (69.2 kg)   09/18/18 154 lb 5 oz (70 kg)  08/28/18 154 lb 6.4 oz (70 kg)   Temp Readings from Last 3 Encounters:  10/04/18 98.4 F (36.9 C) (Oral)  09/20/18 97.8 F (36.6 C) (Oral)  09/18/18 97.9 F (36.6 C) (Oral)   BP Readings from Last 3 Encounters:  10/04/18 101/72  09/20/18 (!) 111/58  09/18/18 109/65   Pulse Readings from Last 3 Encounters:  10/04/18 79  09/20/18 90  09/18/18 82   Constitutional: Well-developed, well-nourished, and in no distress.   HENT: Head: Normocephalic and atraumatic.  Mouth/Throat: No oropharyngeal exudate. Mucosa moist. Eyes: Pupils are equal, round, and reactive to light. Conjunctivae are normal. No scleral icterus.  Neck: Normal range of motion. Neck supple. No JVD present.  Cardiovascular: Normal rate, regular rhythm and normal heart sounds.  Exam reveals no gallop and no friction rub.   No murmur heard. Pulmonary/Chest:  Effort normal and breath sounds normal. No respiratory distress. No wheezes.No rales.  Abdominal: Soft. Bowel sounds are normal. No distension. There is no tenderness. There is no guarding.  Musculoskeletal: No edema or tenderness.  Lymphadenopathy: No cervical, axillary or supraclavicular adenopathy.  Neurological: Alert and oriented to person, place, and time. No cranial nerve deficit.  Skin: Skin is warm and dry. No rash noted. No erythema. No pallor. Dry and Flaking skin on legs.   Psychiatric: Affect and judgment normal.   Labs Appointment on 10/04/2018  Component Date Value Ref Range Status  . WBC 10/04/2018 7.3  4.0 - 10.5 K/uL Final  . RBC 10/04/2018 3.88  3.87 - 5.11 MIL/uL Final  . Hemoglobin 10/04/2018 11.6* 12.0 - 15.0 g/dL Final  . HCT 10/04/2018 38.3  36.0 - 46.0 % Final  . MCV 10/04/2018 98.7  80.0 - 100.0 fL Final  . MCH 10/04/2018 29.9  26.0 - 34.0 pg Final  . MCHC 10/04/2018 30.3  30.0 - 36.0 g/dL Final  . RDW 10/04/2018 16.0* 11.5 - 15.5 % Final  . Platelets 10/04/2018 192  150 - 400 K/uL Final  . nRBC  10/04/2018 0.0  0.0 - 0.2 % Final  . Neutrophils Relative % 10/04/2018 77  % Final  . Neutro Abs 10/04/2018 5.5  1.7 - 7.7 K/uL Final  . Lymphocytes Relative 10/04/2018 18  % Final  . Lymphs Abs 10/04/2018 1.3  0.7 - 4.0 K/uL Final  . Monocytes Relative 10/04/2018 4  % Final  . Monocytes Absolute 10/04/2018 0.3  0.1 - 1.0 K/uL Final  . Eosinophils Relative 10/04/2018 0  % Final  . Eosinophils Absolute 10/04/2018 0.0  0.0 - 0.5 K/uL Final  . Basophils Relative 10/04/2018 0  % Final  . Basophils Absolute 10/04/2018 0.0  0.0 - 0.1 K/uL Final  . Immature Granulocytes 10/04/2018 1  % Final  . Abs Immature Granulocytes 10/04/2018 0.09* 0.00 - 0.07 K/uL Final   Performed at [facility], [address] [place], [place] [address]  . Sodium 10/04/2018 140  135 - 145 mmol/L Final  . Potassium 10/04/2018 4.0  3.5 - 5.1 mmol/L Final  . Chloride 10/04/2018 104  98 - 111 mmol/L Final  . CO2 10/04/2018 27  22 - 32 mmol/L Final  . Glucose, Bld 10/04/2018 110* 70 - 99 mg/dL Final  . BUN 10/04/2018 9  8 - 23 mg/dL Final  . Creatinine, Ser 10/04/2018 0.58  0.44 - 1.00 mg/dL Final  . Calcium 10/04/2018 9.0  8.9 - 10.3 mg/dL Final  . Total Protein 10/04/2018 6.5  6.5 - 8.1 g/dL Final  . Albumin 10/04/2018 3.5  3.5 - 5.0 g/dL Final  . AST 10/04/2018 14* 15 - 41 U/L Final  . ALT 10/04/2018 8  0 - 44 U/L Final  . Alkaline Phosphatase 10/04/2018 59  38 - 126 U/L Final  . Total Bilirubin 10/04/2018 0.2* 0.3 - 1.2 mg/dL Final  . GFR calc non Af Amer 10/04/2018 >60  >60 mL/min Final  . GFR calc Af Amer 10/04/2018 >60  >60 mL/min Final  . Anion gap 10/04/2018 9  5 - 15 Final   Performed at [facility], [address], [place], [place] [address]  . LDH 10/04/2018 130  98 - 192 U/L Final   Performed at [facility], [address] [place], [place] [address]     Pathology Orders Placed This Encounter  Procedures  . CBC with Differential/Platelet    Standing Status:   Future  Standing Expiration Date:    10/05/2019  . Comprehensive metabolic panel    Standing Status:   Future    Standing Expiration Date:   10/05/2019  . Lactate dehydrogenase    Standing Status:   Future    Standing Expiration Date:   10/05/2019       Zoila Shutter MD

## 2018-10-12 ENCOUNTER — Other Ambulatory Visit (HOSPITAL_COMMUNITY): Payer: Medicaid Other

## 2018-10-12 ENCOUNTER — Ambulatory Visit (HOSPITAL_COMMUNITY): Payer: Medicaid Other

## 2018-10-13 ENCOUNTER — Other Ambulatory Visit (HOSPITAL_COMMUNITY): Payer: Medicaid Other

## 2018-10-13 ENCOUNTER — Ambulatory Visit (HOSPITAL_COMMUNITY): Payer: Medicaid Other

## 2018-10-19 ENCOUNTER — Inpatient Hospital Stay (HOSPITAL_COMMUNITY): Payer: Medicaid Other

## 2018-10-19 ENCOUNTER — Encounter (HOSPITAL_COMMUNITY): Payer: Self-pay

## 2018-10-19 VITALS — BP 123/54 | HR 72 | Temp 98.1°F | Resp 18 | Wt 152.6 lb

## 2018-10-19 DIAGNOSIS — Z171 Estrogen receptor negative status [ER-]: Principal | ICD-10-CM

## 2018-10-19 DIAGNOSIS — C50812 Malignant neoplasm of overlapping sites of left female breast: Secondary | ICD-10-CM

## 2018-10-19 DIAGNOSIS — Z5112 Encounter for antineoplastic immunotherapy: Secondary | ICD-10-CM | POA: Diagnosis not present

## 2018-10-19 DIAGNOSIS — C50912 Malignant neoplasm of unspecified site of left female breast: Secondary | ICD-10-CM

## 2018-10-19 LAB — COMPREHENSIVE METABOLIC PANEL
ALK PHOS: 72 U/L (ref 38–126)
ALT: 9 U/L (ref 0–44)
AST: 11 U/L — ABNORMAL LOW (ref 15–41)
Albumin: 3.7 g/dL (ref 3.5–5.0)
Anion gap: 9 (ref 5–15)
BUN: 16 mg/dL (ref 8–23)
CO2: 25 mmol/L (ref 22–32)
CREATININE: 0.58 mg/dL (ref 0.44–1.00)
Calcium: 9.2 mg/dL (ref 8.9–10.3)
Chloride: 106 mmol/L (ref 98–111)
GFR calc Af Amer: >60 mL/min (ref >60)
GFR calc non Af Amer: >60 mL/min (ref >60)
Glucose, Bld: 91 mg/dL (ref 70–99)
Potassium: 4.3 mmol/L (ref 3.5–5.1)
Sodium: 140 mmol/L (ref 135–145)
Total Bilirubin: 0.2 mg/dL — ABNORMAL LOW (ref 0.3–1.2)
Total Protein: 7 g/dL (ref 6.5–8.1)

## 2018-10-19 LAB — CBC WITH DIFFERENTIAL/PLATELET
ABS IMMATURE GRANULOCYTES: 0.01 10*3/uL (ref 0.00–0.07)
Basophils Absolute: 0 10*3/uL (ref 0.0–0.1)
Basophils Relative: 1 %
Eosinophils Absolute: 0 10*3/uL (ref 0.0–0.5)
Eosinophils Relative: 1 %
HCT: 39 % (ref 36.0–46.0)
Hemoglobin: 11.8 g/dL — ABNORMAL LOW (ref 12.0–15.0)
Immature Granulocytes: 0 %
Lymphocytes Relative: 27 %
Lymphs Abs: 1.1 10*3/uL (ref 0.7–4.0)
MCH: 29.4 pg (ref 26.0–34.0)
MCHC: 30.3 g/dL (ref 30.0–36.0)
MCV: 97.3 fL (ref 80.0–100.0)
Monocytes Absolute: 0.4 10*3/uL (ref 0.1–1.0)
Monocytes Relative: 10 %
Neutro Abs: 2.6 10*3/uL (ref 1.7–7.7)
Neutrophils Relative %: 61 %
Platelets: 235 10*3/uL (ref 150–400)
RBC: 4.01 MIL/uL (ref 3.87–5.11)
RDW: 15.1 % (ref 11.5–15.5)
WBC: 4.2 10*3/uL (ref 4.0–10.5)
nRBC: 0 % (ref 0.0–0.2)

## 2018-10-19 LAB — LACTATE DEHYDROGENASE: LDH: 101 U/L (ref 98–192)

## 2018-10-19 MED ORDER — DIPHENHYDRAMINE HCL 25 MG PO CAPS
50.0000 mg | ORAL_CAPSULE | Freq: Once | ORAL | Status: AC
  Administered 2018-10-19: 50 mg via ORAL
  Filled 2018-10-19: qty 2

## 2018-10-19 MED ORDER — HEPARIN SOD (PORK) LOCK FLUSH 100 UNIT/ML IV SOLN
500.0000 [IU] | Freq: Once | INTRAVENOUS | Status: AC | PRN
  Administered 2018-10-19: 500 [IU]

## 2018-10-19 MED ORDER — SODIUM CHLORIDE 0.9% FLUSH
10.0000 mL | INTRAVENOUS | Status: DC | PRN
  Administered 2018-10-19 (×2): 10 mL
  Filled 2018-10-19 (×2): qty 10

## 2018-10-19 MED ORDER — TRASTUZUMAB CHEMO 150 MG IV SOLR
450.0000 mg | Freq: Once | INTRAVENOUS | Status: AC
  Administered 2018-10-19: 450 mg via INTRAVENOUS
  Filled 2018-10-19: qty 21.43

## 2018-10-19 MED ORDER — SODIUM CHLORIDE 0.9 % IV SOLN
Freq: Once | INTRAVENOUS | Status: AC
  Administered 2018-10-19: 11:00:00 via INTRAVENOUS

## 2018-10-19 MED ORDER — SODIUM CHLORIDE 0.9 % IV SOLN
420.0000 mg | Freq: Once | INTRAVENOUS | Status: AC
  Administered 2018-10-19: 420 mg via INTRAVENOUS
  Filled 2018-10-19: qty 14

## 2018-10-19 NOTE — Progress Notes (Signed)
Patient to treatment room with no complaints voiced.  No s/s of distress noted.  Patient tolerated treatment with no complaints voiced.  Port site clean and dry with no bruising or swelling noted at site.  Good blood return noted before and after administration of therapy.  Band aid applied.  Patient left ambulatory with VSS and no s/s of distress noted.

## 2018-10-19 NOTE — Patient Instructions (Signed)
[facility] Discharge Instructions for Patients Receiving Chemotherapy  Today you received the following chemotherapy agents  If you develop nausea and vomiting that is not controlled by your nausea medication, call the clinic.   BELOW ARE SYMPTOMS THAT SHOULD BE REPORTED IMMEDIATELY:  *FEVER GREATER THAN 100.5 F  *CHILLS WITH OR WITHOUT FEVER  NAUSEA AND VOMITING THAT IS NOT CONTROLLED WITH YOUR NAUSEA MEDICATION  *UNUSUAL SHORTNESS OF BREATH  *UNUSUAL BRUISING OR BLEEDING  TENDERNESS IN MOUTH AND THROAT WITH OR WITHOUT PRESENCE OF ULCERS  *URINARY PROBLEMS  *BOWEL PROBLEMS  UNUSUAL RASH Items with * indicate a potential emergency and should be followed up as soon as possible.  Feel free to call the clinic should you have any questions or concerns. The clinic phone number is [phone].  Please show the CHEMO ALERT CARD at check-in to the Emergency Department and triage nurse.   

## 2018-11-03 ENCOUNTER — Ambulatory Visit (HOSPITAL_COMMUNITY): Payer: Medicaid Other

## 2018-11-03 ENCOUNTER — Ambulatory Visit (HOSPITAL_COMMUNITY): Payer: Medicaid Other | Admitting: Hematology

## 2018-11-03 ENCOUNTER — Other Ambulatory Visit (HOSPITAL_COMMUNITY): Payer: Medicaid Other

## 2018-11-09 ENCOUNTER — Other Ambulatory Visit (HOSPITAL_COMMUNITY): Payer: Medicaid Other

## 2018-11-09 ENCOUNTER — Other Ambulatory Visit: Payer: Self-pay

## 2018-11-09 ENCOUNTER — Inpatient Hospital Stay (HOSPITAL_COMMUNITY): Payer: Medicaid Other | Attending: Hematology | Admitting: Hematology

## 2018-11-09 ENCOUNTER — Encounter (HOSPITAL_COMMUNITY): Payer: Self-pay | Admitting: Hematology

## 2018-11-09 ENCOUNTER — Inpatient Hospital Stay (HOSPITAL_COMMUNITY): Payer: Medicaid Other

## 2018-11-09 VITALS — BP 113/80 | HR 66 | Temp 98.2°F | Resp 16 | Wt 155.0 lb

## 2018-11-09 VITALS — BP 98/69 | HR 65

## 2018-11-09 DIAGNOSIS — C7801 Secondary malignant neoplasm of right lung: Secondary | ICD-10-CM | POA: Diagnosis not present

## 2018-11-09 DIAGNOSIS — C50912 Malignant neoplasm of unspecified site of left female breast: Secondary | ICD-10-CM

## 2018-11-09 DIAGNOSIS — Z171 Estrogen receptor negative status [ER-]: Secondary | ICD-10-CM

## 2018-11-09 DIAGNOSIS — Z5112 Encounter for antineoplastic immunotherapy: Secondary | ICD-10-CM | POA: Insufficient documentation

## 2018-11-09 DIAGNOSIS — C773 Secondary and unspecified malignant neoplasm of axilla and upper limb lymph nodes: Secondary | ICD-10-CM | POA: Diagnosis not present

## 2018-11-09 DIAGNOSIS — Z79899 Other long term (current) drug therapy: Secondary | ICD-10-CM

## 2018-11-09 DIAGNOSIS — C7802 Secondary malignant neoplasm of left lung: Secondary | ICD-10-CM | POA: Diagnosis not present

## 2018-11-09 DIAGNOSIS — C50812 Malignant neoplasm of overlapping sites of left female breast: Secondary | ICD-10-CM | POA: Diagnosis present

## 2018-11-09 LAB — COMPREHENSIVE METABOLIC PANEL
ALT: 10 U/L (ref 0–44)
AST: 15 U/L (ref 15–41)
Albumin: 3.6 g/dL (ref 3.5–5.0)
Alkaline Phosphatase: 75 U/L (ref 38–126)
Anion gap: 8 (ref 5–15)
BUN: 15 mg/dL (ref 8–23)
CO2: 27 mmol/L (ref 22–32)
Calcium: 9.1 mg/dL (ref 8.9–10.3)
Chloride: 103 mmol/L (ref 98–111)
Creatinine, Ser: 0.56 mg/dL (ref 0.44–1.00)
GFR calc Af Amer: >60 mL/min (ref >60)
GFR calc non Af Amer: >60 mL/min (ref >60)
Glucose, Bld: 124 mg/dL — ABNORMAL HIGH (ref 70–99)
Potassium: 4 mmol/L (ref 3.5–5.1)
SODIUM: 138 mmol/L (ref 135–145)
Total Bilirubin: 0.2 mg/dL — ABNORMAL LOW (ref 0.3–1.2)
Total Protein: 7.1 g/dL (ref 6.5–8.1)

## 2018-11-09 LAB — CBC WITH DIFFERENTIAL/PLATELET
ABS IMMATURE GRANULOCYTES: 0 10*3/uL (ref 0.00–0.07)
BASOS PCT: 0 %
Basophils Absolute: 0 10*3/uL (ref 0.0–0.1)
Eosinophils Absolute: 0.2 10*3/uL (ref 0.0–0.5)
Eosinophils Relative: 4 %
HCT: 39.5 % (ref 36.0–46.0)
Hemoglobin: 12.1 g/dL (ref 12.0–15.0)
Immature Granulocytes: 0 %
Lymphocytes Relative: 22 %
Lymphs Abs: 1.3 10*3/uL (ref 0.7–4.0)
MCH: 28.9 pg (ref 26.0–34.0)
MCHC: 30.6 g/dL (ref 30.0–36.0)
MCV: 94.3 fL (ref 80.0–100.0)
Monocytes Absolute: 0.3 10*3/uL (ref 0.1–1.0)
Monocytes Relative: 5 %
Neutro Abs: 4.1 10*3/uL (ref 1.7–7.7)
Neutrophils Relative %: 69 %
PLATELETS: 208 10*3/uL (ref 150–400)
RBC: 4.19 MIL/uL (ref 3.87–5.11)
RDW: 14 % (ref 11.5–15.5)
WBC: 5.9 10*3/uL (ref 4.0–10.5)
nRBC: 0 % (ref 0.0–0.2)

## 2018-11-09 MED ORDER — DIPHENHYDRAMINE HCL 25 MG PO CAPS
50.0000 mg | ORAL_CAPSULE | Freq: Once | ORAL | Status: AC
  Administered 2018-11-09: 50 mg via ORAL

## 2018-11-09 MED ORDER — SODIUM CHLORIDE 0.9% FLUSH
10.0000 mL | INTRAVENOUS | Status: DC | PRN
  Administered 2018-11-09: 10 mL
  Filled 2018-11-09: qty 10

## 2018-11-09 MED ORDER — HEPARIN SOD (PORK) LOCK FLUSH 100 UNIT/ML IV SOLN
500.0000 [IU] | Freq: Once | INTRAVENOUS | Status: AC | PRN
  Administered 2018-11-09: 500 [IU]
  Filled 2018-11-09: qty 5

## 2018-11-09 MED ORDER — SODIUM CHLORIDE 0.9 % IV SOLN
Freq: Once | INTRAVENOUS | Status: AC
  Administered 2018-11-09: 10:00:00 via INTRAVENOUS

## 2018-11-09 MED ORDER — TRASTUZUMAB CHEMO 150 MG IV SOLR
450.0000 mg | Freq: Once | INTRAVENOUS | Status: AC
  Administered 2018-11-09: 450 mg via INTRAVENOUS
  Filled 2018-11-09: qty 21.43

## 2018-11-09 MED ORDER — SODIUM CHLORIDE 0.9 % IV SOLN
420.0000 mg | Freq: Once | INTRAVENOUS | Status: AC
  Administered 2018-11-09: 420 mg via INTRAVENOUS
  Filled 2018-11-09: qty 14

## 2018-11-09 MED ORDER — DIPHENHYDRAMINE HCL 25 MG PO CAPS
ORAL_CAPSULE | ORAL | Status: AC
  Filled 2018-11-09: qty 2

## 2018-11-09 NOTE — Patient Instructions (Signed)
[facility] Discharge Instructions  You were seen today by Dr. Katragadda. He went over your recent results. He will see you back in for labs and follow up.   Thank you for choosing [facility] at [facility] to provide your oncology and hematology care.  To afford each patient quality time with our provider, please arrive at least 15 minutes before your scheduled appointment time.   If you have a lab appointment with the Cancer Center please come in thru the  Main Entrance and check in at the main information desk  You need to re-schedule your appointment should you arrive 10 or more minutes late.  We strive to give you quality time with our providers, and arriving late affects you and other patients whose appointments are after yours.  Also, if you no show three or more times for appointments you may be dismissed from the clinic at the providers discretion.     Again, thank you for choosing [facility].  Our hope is that these requests will decrease the amount of time that you wait before being seen by our physicians.       _____________________________________________________________  Should you have questions after your visit to [facility], please contact our office at [phone] between the hours of 8:00 a.m. and 4:30 p.m.  Voicemails left after 4:00 p.m. will not be returned until the following business day.  For prescription refill requests, have your pharmacy contact our office and allow 72 hours.    Cancer Center Support Programs:   > Cancer Support Group  2nd Tuesday of the month 1pm-2pm, Journey Room    

## 2018-11-09 NOTE — Progress Notes (Signed)
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:   BRIEF ONCOLOGIC HISTORY:    Malignant neoplasm of overlapping sites of left female breast ([place])   05/15/2018 Initial Diagnosis    Malignant neoplasm of overlapping sites of left female breast ([place])    06/05/2018 -  Chemotherapy    The patient had pegfilgrastim-cbqv (UDENYCA) injection 6 mg, 6 mg, Subcutaneous, Once, 6 of 6 cycles Administration: 6 mg (06/07/2018), 6 mg (06/28/2018), 6 mg (07/19/2018), 6 mg (08/30/2018), 6 mg (09/20/2018), 6 mg (08/10/2018) trastuzumab (HERCEPTIN) 600 mg in sodium chloride 0.9 % 250 mL chemo infusion, 609 mg, Intravenous,  Once, 8 of 13 cycles Administration: 600 mg (06/05/2018), 450 mg (06/26/2018), 450 mg (07/17/2018), 450 mg (08/28/2018), 450 mg (09/18/2018), 450 mg (08/07/2018), 450 mg (10/19/2018), 450 mg (11/09/2018) DOCEtaxel (TAXOTERE) 140 mg in sodium chloride 0.9 % 250 mL chemo infusion, 75 mg/m2 = 140 mg, Intravenous,  Once, 6 of 6 cycles Administration: 140 mg (06/05/2018), 140 mg (06/26/2018), 140 mg (07/17/2018), 140 mg (08/28/2018), 140 mg (09/18/2018), 140 mg (08/07/2018) ondansetron (ZOFRAN) 8 mg, dexamethasone (DECADRON) 4 mg in sodium chloride 0.9 % 50 mL IVPB, , Intravenous,  Once, 6 of 6 cycles Administration:  (06/05/2018),  (06/26/2018),  (07/17/2018),  (08/28/2018),  (09/18/2018),  (08/07/2018) pertuzumab (PERJETA) 840 mg in sodium chloride 0.9 % 250 mL chemo infusion, 840 mg, Intravenous, Once, 8 of 13 cycles Administration: 840 mg (06/05/2018), 420 mg (06/26/2018), 420 mg (07/17/2018), 420 mg (08/28/2018), 420 mg (09/18/2018), 420 mg (08/07/2018), 420 mg (10/19/2018), 420 mg (11/09/2018)  for chemotherapy treatment.       CANCER STAGING: Cancer  Staging No matching staging information was found for the patient.   INTERVAL HISTORY:  [name] 63 y.o. female returns for routine follow-up. She is here today with a caregiver. She states that she has been feeling good. Denies any nausea, vomiting, or diarrhea. Denies any new pains. Had not noticed any recent bleeding such as epistaxis, hematuria or hematochezia. Denies recent chest pain on exertion, shortness of breath on minimal exertion, pre-syncopal episodes, or palpitations. Denies any numbness or tingling in hands or feet. Denies any recent fevers, infections, or recent hospitalizations. Patient reports appetite at 100%  and energy level at 100 %.   REVIEW OF SYSTEMS:  Review of Systems  HENT:   Positive for mouth sores.   Gastrointestinal: Positive for nausea.     PAST MEDICAL/SURGICAL HISTORY:  Past Medical History:  Diagnosis Date  . Alzheimer's dementia ([place])   . Bradycardia   . Breast cancer ([place])   . Dementia ([place])   . Depression   . Hypokalemia   . Hypotension   . 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 ABDOMINAL HYSTERECTOMY    . BREAST BIOPSY Left   . PORTACATH PLACEMENT Right 05/15/2018   Procedure: INSERTION PORT-A-CATH;  Surgeon: Aviva Signs, MD;  Location: [facility];  Service: General;  Laterality: Right;     SOCIAL HISTORY:  Social History   Socioeconomic History  . Marital status: Widowed    Spouse name: Not on file  . Number of children: Not on file  . Years of education: Not on file  .  Highest education level: Not on file  Occupational History  . Not on file  Social Needs  . Financial resource strain: Not on file  . Food insecurity:    Worry: Not on file    Inability: Not on file  . Transportation needs:    Medical: Not on file    Non-medical: Not on file  Tobacco Use  . Smoking status: Unknown If Ever Smoked  . Smokeless tobacco: Never Used   Substance and Sexual Activity  . Alcohol use: Not Currently  . Drug use: Never  . Sexual activity: Not Currently  Lifestyle  . Physical activity:    Days per week: Not on file    Minutes per session: Not on file  . Stress: Not on file  Relationships  . Social connections:    Talks on phone: Not on file    Gets together: Not on file    Attends religious service: Not on file    Active member of club or organization: Not on file    Attends meetings of clubs or organizations: Not on file    Relationship status: Not on file  . Intimate partner violence:    Fear of current or ex partner: Not on file    Emotionally abused: Not on file    Physically abused: Not on file    Forced sexual activity: Not on file  Other Topics Concern  . Not on file  Social History Narrative   Lives at [facility] in [place]:  History reviewed. No pertinent family history.  CURRENT MEDICATIONS:  Outpatient Encounter Medications as of 11/09/2018  Medication Sig  . acetaminophen (TYLENOL) 325 MG tablet Take 650 mg by mouth every 6 (six) hours as needed for mild pain.  Marland Kitchen ALPRAZolam (XANAX) 0.25 MG tablet Take 0.25 mg by mouth every 8 (eight) hours as needed for anxiety.   . cholecalciferol (VITAMIN D) 1000 units tablet Take 2,000 Units by mouth daily.   . divalproex (DEPAKOTE) 500 MG DR tablet Take 500 mg by mouth daily.   Marland Kitchen docusate sodium (COLACE) 100 MG capsule Take 100 mg by mouth 2 (two) times daily.  Marland Kitchen lidocaine-prilocaine (EMLA) cream Apply to affected area once  . LORazepam (ATIVAN) 0.5 MG tablet Take 0.5 mg by mouth every 6 (six) hours as needed for anxiety.  . memantine (NAMENDA) 10 MG tablet Take 10 mg by mouth 2 (two) times daily.  . mirtazapine (REMERON) 7.5 MG tablet Take 7.5 mg by mouth at bedtime.  . ondansetron (ZOFRAN) 4 MG tablet Take 4 mg by mouth every 8 (eight) hours as needed for nausea or vomiting.  . Pertuzumab (PERJETA IV) Inject into the vein.  Marland Kitchen  prochlorperazine (COMPAZINE) 10 MG tablet Take 1 tablet (10 mg total) by mouth every 6 (six) hours as needed (Nausea or vomiting).  . QUEtiapine (SEROQUEL) 25 MG tablet Take 12.5 mg by mouth 3 (three) times daily.  . sertraline (ZOLOFT) 100 MG tablet Take 150 mg by mouth daily.   . Trastuzumab (HERCEPTIN IV) Inject into the vein.  . [DISCONTINUED] DOCEtaxel (TAXOTERE IV) Inject into the vein.  . [DISCONTINUED] pegfilgrastim-cbqv (UDENYCA) 6 MG/0.6ML injection Inject 6 mg into the skin once.   Facility-Administered Encounter Medications as of 11/09/2018  Medication  . sodium chloride flush (NS) 0.9 % injection 10 mL    ALLERGIES:  No Known Allergies   PHYSICAL EXAM:  ECOG Performance status: 1  Vitals:   11/09/18 0917  BP: 113/80  Pulse: 66  Resp: 16  Temp: 98.2 F (36.8 C)  SpO2: 97%   Filed Weights   11/09/18 0917  Weight: 155 lb (70.3 kg)    Physical Exam Constitutional:      Appearance: Normal appearance.  Cardiovascular:     Rate and Rhythm: Normal rate and regular rhythm.     Heart sounds: Normal heart sounds.  Pulmonary:     Effort: Pulmonary effort is normal.     Breath sounds: Normal breath sounds.  Abdominal:     General: Bowel sounds are normal. There is no distension.     Palpations: Abdomen is soft.  Musculoskeletal:        General: No swelling.  Skin:    General: Skin is warm.  Neurological:     General: No focal deficit present.     Mental Status: She is alert.  Psychiatric:        Mood and Affect: Mood normal.        Behavior: Behavior normal.      LABORATORY DATA:  I have reviewed the labs as listed.  CBC    Component Value Date/Time   WBC 5.9 11/09/2018 0909   RBC 4.19 11/09/2018 0909   HGB 12.1 11/09/2018 0909   HCT 39.5 11/09/2018 0909   PLT 208 11/09/2018 0909   MCV 94.3 11/09/2018 0909   MCH 28.9 11/09/2018 0909   MCHC 30.6 11/09/2018 0909   RDW 14.0 11/09/2018 0909   LYMPHSABS 1.3 11/09/2018 0909   MONOABS 0.3 11/09/2018  0909   EOSABS 0.2 11/09/2018 0909   BASOSABS 0.0 11/09/2018 0909   CMP Latest Ref Rng & Units 11/09/2018 10/19/2018 10/04/2018  Glucose 70 - 99 mg/dL 124(H) 91 110(H)  BUN 8 - 23 mg/dL '15 16 9  ' Creatinine 0.44 - 1.00 mg/dL 0.56 0.58 0.58  Sodium 135 - 145 mmol/L 138 140 140  Potassium 3.5 - 5.1 mmol/L 4.0 4.3 4.0  Chloride 98 - 111 mmol/L 103 106 104  CO2 22 - 32 mmol/L '27 25 27  ' Calcium 8.9 - 10.3 mg/dL 9.1 9.2 9.0  Total Protein 6.5 - 8.1 g/dL 7.1 7.0 6.5  Total Bilirubin 0.3 - 1.2 mg/dL 0.2(L) 0.2(L) 0.2(L)  Alkaline Phos 38 - 126 U/L 75 72 59  AST 15 - 41 U/L 15 11(L) 14(L)  ALT 0 - 44 U/L '10 9 8       ' DIAGNOSTIC IMAGING:  I have independently reviewed the scans and discussed with the patient.   I have reviewed Venita Lick LPN's note and agree with the documentation.  I personally performed a face-to-face visit, made revisions and my assessment and plan is as follows.    ASSESSMENT & PLAN:   HER2-positive carcinoma of left breast ([place]) 1.  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 - PET/CT scan on 05/11/2018 shows bilateral hypermetabolic pulmonary nodules consistent with pulmonary metastasis with no metastatic disease outside of the thorax. - On 05/23/2018, lung biopsy shows poorly differentiated metastatic carcinoma, consistent with breast primary. - Echocardiogram on 05/03/2018 shows ejection fraction of 60 to 65%. - 6 cycles of docetaxel, Herceptin and Pertuzumab from 06/05/2018 through 09/18/2018. - Maintenance Herceptin and Pertuzumab started on 10/19/2018. - PET CT scan on 09/29/2018 shows marked reduction in size and metabolic activity of bilateral pulmonary nodules and breast mass and adenopathy. -She is tolerating  Herceptin and Pertuzumab very well.  Denies any major issues with diarrhea.  Denies any  symptoms of PND or orthopnea. - She may proceed with her maintenance Herceptin and Pertuzumab today.  I have reviewed her labs. -I will see her back in 3 weeks for follow-up.  I plan to do surveillance 2D echocardiogram prior to next visit. -I plan to repeat PET CT scan in 3 to 4 months.      Orders placed this encounter:  Orders Placed This Encounter  Procedures  . [place], [place] [phone]

## 2018-11-09 NOTE — Assessment & Plan Note (Signed)
1.  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 - PET/CT scan on 05/11/2018 shows bilateral hypermetabolic pulmonary nodules consistent with pulmonary metastasis with no metastatic disease outside of the thorax. - On 05/23/2018, lung biopsy shows poorly differentiated metastatic carcinoma, consistent with breast primary. - Echocardiogram on 05/03/2018 shows ejection fraction of 60 to 65%. - 6 cycles of docetaxel, Herceptin and Pertuzumab from 06/05/2018 through 09/18/2018. - Maintenance Herceptin and Pertuzumab started on 10/19/2018. - PET CT scan on 09/29/2018 shows marked reduction in size and metabolic activity of bilateral pulmonary nodules and breast mass and adenopathy. -She is tolerating Herceptin and Pertuzumab very well.  Denies any major issues with diarrhea.  Denies any symptoms of PND or orthopnea. - She may proceed with her maintenance Herceptin and Pertuzumab today.  I have reviewed her labs. -I will see her back in 3 weeks for follow-up.  I plan to do surveillance 2D echocardiogram prior to next visit. -I plan to repeat PET CT scan in 3 to 4 months.

## 2018-11-09 NOTE — Progress Notes (Signed)
Pt presents today for Herceptin and Perjeta. VSS. Pt seen by Dr. Delton Coombes today. Message sent to proceed with treatment. Labs reviewed. Parameters met.   Treatment given today per MD orders. Tolerated infusion without adverse affects. Vital signs stable. No complaints at this time. Discharged from clinic ambulatory. F/U with [facility] as scheduled.

## 2018-11-27 ENCOUNTER — Ambulatory Visit (HOSPITAL_COMMUNITY): Admission: RE | Admit: 2018-11-27 | Payer: Medicare Other | Source: Ambulatory Visit

## 2018-11-27 DIAGNOSIS — R5382 Chronic fatigue, unspecified: Secondary | ICD-10-CM | POA: Diagnosis not present

## 2018-11-27 DIAGNOSIS — J029 Acute pharyngitis, unspecified: Secondary | ICD-10-CM | POA: Diagnosis not present

## 2018-11-27 DIAGNOSIS — J069 Acute upper respiratory infection, unspecified: Secondary | ICD-10-CM | POA: Diagnosis not present

## 2018-11-28 DIAGNOSIS — M6281 Muscle weakness (generalized): Secondary | ICD-10-CM | POA: Diagnosis not present

## 2018-11-30 ENCOUNTER — Other Ambulatory Visit (HOSPITAL_COMMUNITY): Payer: Medicaid Other

## 2018-11-30 ENCOUNTER — Other Ambulatory Visit: Payer: Self-pay

## 2018-11-30 ENCOUNTER — Encounter (HOSPITAL_COMMUNITY): Payer: Self-pay

## 2018-11-30 ENCOUNTER — Ambulatory Visit (HOSPITAL_COMMUNITY)
Admission: RE | Admit: 2018-11-30 | Discharge: 2018-11-30 | Disposition: A | Discharge status: Home / Self Care | Payer: Medicare Other | Source: Ambulatory Visit | Attending: Hematology | Admitting: Hematology

## 2018-11-30 ENCOUNTER — Ambulatory Visit (HOSPITAL_COMMUNITY): Payer: Medicaid Other | Admitting: Hematology

## 2018-11-30 ENCOUNTER — Ambulatory Visit (HOSPITAL_COMMUNITY): Payer: Medicaid Other

## 2018-11-30 DIAGNOSIS — Z853 Personal history of malignant neoplasm of breast: Secondary | ICD-10-CM | POA: Diagnosis not present

## 2018-11-30 DIAGNOSIS — C50812 Malignant neoplasm of overlapping sites of left female breast: Secondary | ICD-10-CM | POA: Insufficient documentation

## 2018-11-30 DIAGNOSIS — Z171 Estrogen receptor negative status [ER-]: Secondary | ICD-10-CM | POA: Diagnosis not present

## 2018-11-30 DIAGNOSIS — C50012 Malignant neoplasm of nipple and areola, left female breast: Secondary | ICD-10-CM | POA: Diagnosis not present

## 2018-11-30 MED ORDER — HEPARIN SOD (PORK) LOCK FLUSH 100 UNIT/ML IV SOLN
INTRAVENOUS | Status: AC
  Filled 2018-11-30: qty 5

## 2018-11-30 MED ORDER — SODIUM CHLORIDE FLUSH 0.9 % IV SOLN
INTRAVENOUS | Status: AC
  Filled 2018-11-30: qty 40

## 2018-11-30 MED ORDER — TECHNETIUM TC 99M-LABELED RED BLOOD CELLS IV KIT
20.0000 | PACK | Freq: Once | INTRAVENOUS | Status: AC | PRN
  Administered 2018-11-30: 21 via INTRAVENOUS

## 2018-12-05 ENCOUNTER — Other Ambulatory Visit: Payer: Self-pay

## 2018-12-05 ENCOUNTER — Inpatient Hospital Stay (HOSPITAL_BASED_OUTPATIENT_CLINIC_OR_DEPARTMENT_OTHER): Payer: Medicare Other | Admitting: Hematology

## 2018-12-05 ENCOUNTER — Encounter (HOSPITAL_COMMUNITY): Payer: Self-pay | Admitting: Hematology

## 2018-12-05 ENCOUNTER — Inpatient Hospital Stay (HOSPITAL_COMMUNITY): Payer: Medicare Other | Attending: Hematology

## 2018-12-05 ENCOUNTER — Inpatient Hospital Stay (HOSPITAL_COMMUNITY): Payer: Medicare Other

## 2018-12-05 VITALS — BP 121/55 | HR 58 | Resp 18

## 2018-12-05 DIAGNOSIS — C50912 Malignant neoplasm of unspecified site of left female breast: Secondary | ICD-10-CM

## 2018-12-05 DIAGNOSIS — Z79899 Other long term (current) drug therapy: Secondary | ICD-10-CM

## 2018-12-05 DIAGNOSIS — C50812 Malignant neoplasm of overlapping sites of left female breast: Secondary | ICD-10-CM | POA: Insufficient documentation

## 2018-12-05 DIAGNOSIS — Z5112 Encounter for antineoplastic immunotherapy: Secondary | ICD-10-CM | POA: Insufficient documentation

## 2018-12-05 DIAGNOSIS — C773 Secondary and unspecified malignant neoplasm of axilla and upper limb lymph nodes: Secondary | ICD-10-CM | POA: Insufficient documentation

## 2018-12-05 DIAGNOSIS — C7802 Secondary malignant neoplasm of left lung: Secondary | ICD-10-CM | POA: Insufficient documentation

## 2018-12-05 DIAGNOSIS — Z90710 Acquired absence of both cervix and uterus: Secondary | ICD-10-CM | POA: Diagnosis not present

## 2018-12-05 DIAGNOSIS — C7801 Secondary malignant neoplasm of right lung: Secondary | ICD-10-CM | POA: Diagnosis not present

## 2018-12-05 DIAGNOSIS — Z171 Estrogen receptor negative status [ER-]: Secondary | ICD-10-CM

## 2018-12-05 LAB — COMPREHENSIVE METABOLIC PANEL
ALT: 11 U/L (ref 0–44)
AST: 16 U/L (ref 15–41)
Albumin: 3.8 g/dL (ref 3.5–5.0)
Alkaline Phosphatase: 85 U/L (ref 38–126)
Anion gap: 11 (ref 5–15)
BUN: 15 mg/dL (ref 8–23)
CO2: 27 mmol/L (ref 22–32)
Calcium: 9.1 mg/dL (ref 8.9–10.3)
Chloride: 101 mmol/L (ref 98–111)
Creatinine, Ser: 0.52 mg/dL (ref 0.44–1.00)
GFR calc Af Amer: >60 mL/min (ref >60)
GFR calc non Af Amer: >60 mL/min (ref >60)
Glucose, Bld: 112 mg/dL — ABNORMAL HIGH (ref 70–99)
Potassium: 3.6 mmol/L (ref 3.5–5.1)
Sodium: 139 mmol/L (ref 135–145)
Total Bilirubin: 0.7 mg/dL (ref 0.3–1.2)
Total Protein: 7.8 g/dL (ref 6.5–8.1)

## 2018-12-05 LAB — CBC WITH DIFFERENTIAL/PLATELET
Abs Immature Granulocytes: 0.03 10*3/uL (ref 0.00–0.07)
Basophils Absolute: 0 10*3/uL (ref 0.0–0.1)
Basophils Relative: 0 %
Eosinophils Absolute: 0.2 10*3/uL (ref 0.0–0.5)
Eosinophils Relative: 2 %
HCT: 41.5 % (ref 36.0–46.0)
Hemoglobin: 12.8 g/dL (ref 12.0–15.0)
Immature Granulocytes: 0 %
Lymphocytes Relative: 22 %
Lymphs Abs: 1.6 10*3/uL (ref 0.7–4.0)
MCH: 27.7 pg (ref 26.0–34.0)
MCHC: 30.8 g/dL (ref 30.0–36.0)
MCV: 89.8 fL (ref 80.0–100.0)
Monocytes Absolute: 0.4 10*3/uL (ref 0.1–1.0)
Monocytes Relative: 5 %
Neutro Abs: 5.3 10*3/uL (ref 1.7–7.7)
Neutrophils Relative %: 71 %
Platelets: 275 10*3/uL (ref 150–400)
RBC: 4.62 MIL/uL (ref 3.87–5.11)
RDW: 13.9 % (ref 11.5–15.5)
WBC: 7.6 10*3/uL (ref 4.0–10.5)
nRBC: 0 % (ref 0.0–0.2)

## 2018-12-05 MED ORDER — TRASTUZUMAB CHEMO 150 MG IV SOLR
450.0000 mg | Freq: Once | INTRAVENOUS | Status: AC
  Administered 2018-12-05: 450 mg via INTRAVENOUS
  Filled 2018-12-05: qty 21.43

## 2018-12-05 MED ORDER — DIPHENHYDRAMINE HCL 25 MG PO CAPS
50.0000 mg | ORAL_CAPSULE | Freq: Once | ORAL | Status: AC
  Administered 2018-12-05: 50 mg via ORAL
  Filled 2018-12-05: qty 2

## 2018-12-05 MED ORDER — SODIUM CHLORIDE 0.9% FLUSH
10.0000 mL | INTRAVENOUS | Status: DC | PRN
  Administered 2018-12-05: 10 mL
  Filled 2018-12-05: qty 10

## 2018-12-05 MED ORDER — SODIUM CHLORIDE 0.9 % IV SOLN
420.0000 mg | Freq: Once | INTRAVENOUS | Status: AC
  Administered 2018-12-05: 420 mg via INTRAVENOUS
  Filled 2018-12-05: qty 14

## 2018-12-05 MED ORDER — HEPARIN SOD (PORK) LOCK FLUSH 100 UNIT/ML IV SOLN
500.0000 [IU] | Freq: Once | INTRAVENOUS | Status: AC | PRN
  Administered 2018-12-05: 500 [IU]

## 2018-12-05 MED ORDER — SODIUM CHLORIDE 0.9 % IV SOLN
Freq: Once | INTRAVENOUS | Status: AC
  Administered 2018-12-05: 11:00:00 via INTRAVENOUS

## 2018-12-05 NOTE — Progress Notes (Signed)
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   BRIEF ONCOLOGIC HISTORY:    Malignant neoplasm of overlapping sites of left female breast ([place])   05/15/2018 Initial Diagnosis    Malignant neoplasm of overlapping sites of left female breast ([place])    06/05/2018 -  Chemotherapy    The patient had pegfilgrastim-cbqv (UDENYCA) injection 6 mg, 6 mg, Subcutaneous, Once, 6 of 6 cycles Administration: 6 mg (06/07/2018), 6 mg (06/28/2018), 6 mg (07/19/2018), 6 mg (08/30/2018), 6 mg (09/20/2018), 6 mg (08/10/2018) trastuzumab (HERCEPTIN) 600 mg in sodium chloride 0.9 % 250 mL chemo infusion, 609 mg, Intravenous,  Once, 9 of 13 cycles Administration: 600 mg (06/05/2018), 450 mg (06/26/2018), 450 mg (07/17/2018), 450 mg (08/28/2018), 450 mg (09/18/2018), 450 mg (08/07/2018), 450 mg (10/19/2018), 450 mg (11/09/2018), 450 mg (12/05/2018) DOCEtaxel (TAXOTERE) 140 mg in sodium chloride 0.9 % 250 mL chemo infusion, 75 mg/m2 = 140 mg, Intravenous,  Once, 6 of 6 cycles Administration: 140 mg (06/05/2018), 140 mg (06/26/2018), 140 mg (07/17/2018), 140 mg (08/28/2018), 140 mg (09/18/2018), 140 mg (08/07/2018) ondansetron (ZOFRAN) 8 mg, dexamethasone (DECADRON) 4 mg in sodium chloride 0.9 % 50 mL IVPB, , Intravenous,  Once, 6 of 6 cycles Administration:  (06/05/2018),  (06/26/2018),  (07/17/2018),  (08/28/2018),  (09/18/2018),  (08/07/2018) pertuzumab (PERJETA) 840 mg in sodium chloride 0.9 % 250 mL chemo infusion, 840 mg, Intravenous, Once, 9 of 13 cycles Administration: 840 mg (06/05/2018), 420 mg (06/26/2018), 420 mg (07/17/2018), 420 mg (08/28/2018), 420 mg (09/18/2018), 420 mg (08/07/2018), 420 mg (10/19/2018), 420 mg (11/09/2018), 420 mg (12/05/2018)  for chemotherapy  treatment.       CANCER STAGING: Cancer Staging No matching staging information was found for the patient.   INTERVAL HISTORY:  [name] [name] 63 y.o. female returns for routine follow-up and consideration for next cycle of chemotherapy. She is here today alone. She states that she has a few ares on her back as well as her chin that are red. Denies any nausea, vomiting, or diarrhea. Denies any new pains. Had not noticed any recent bleeding such as epistaxis, hematuria or hematochezia. Denies recent chest pain on exertion, shortness of breath on minimal exertion, pre-syncopal episodes, or palpitations. Denies any numbness or tingling in hands or feet. Denies any recent fevers, infections, or recent hospitalizations. Patient reports appetite at 100% and energy level at 100%.     REVIEW OF SYSTEMS:  Review of Systems  Skin: Positive for rash.     PAST MEDICAL/SURGICAL HISTORY:  Past Medical History:  Diagnosis Date  . Alzheimer's dementia ([place])   . Bradycardia   . Breast cancer ([place])   . Dementia ([place])   . Depression   . Hypokalemia   . Hypotension   . 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 ABDOMINAL HYSTERECTOMY    . BREAST BIOPSY Left   . PORTACATH PLACEMENT Right 05/15/2018   Procedure: INSERTION PORT-A-CATH;  Surgeon: [name] Signs, MD;  Location: [facility];  Service: General;  Laterality: Right;     SOCIAL HISTORY:  Social History   Socioeconomic History  . Marital status: Widowed    Spouse name: Not on file  . Number of  children: Not on file  . Years of education: Not on file  . Highest education level: Not on file  Occupational History  . Not on file  Social Needs  . Financial resource strain: Not on file  . Food insecurity:    Worry: Not on file    Inability: Not on file  . Transportation needs:    Medical: Not on file    Non-medical: Not on file  Tobacco Use  . Smoking  status: Unknown If Ever Smoked  . Smokeless tobacco: Never Used  Substance and Sexual Activity  . Alcohol use: Not Currently  . Drug use: Never  . Sexual activity: Not Currently  Lifestyle  . Physical activity:    Days per week: Not on file    Minutes per session: Not on file  . Stress: Not on file  Relationships  . Social connections:    Talks on phone: Not on file    Gets together: Not on file    Attends religious service: Not on file    Active member of club or organization: Not on file    Attends meetings of clubs or organizations: Not on file    Relationship status: Not on file  . Intimate partner violence:    Fear of current or ex partner: Not on file    Emotionally abused: Not on file    Physically abused: Not on file    Forced sexual activity: Not on file  Other Topics Concern  . Not on file  Social History Narrative   Lives at [facility] in [place]:  History reviewed. No pertinent family history.  CURRENT MEDICATIONS:  Outpatient Encounter Medications as of 12/05/2018  Medication Sig  . acetaminophen (TYLENOL) 325 MG tablet Take 650 mg by mouth every 6 (six) hours as needed for mild pain.  Marland Kitchen ALPRAZolam (XANAX) 0.25 MG tablet Take 0.25 mg by mouth every 8 (eight) hours as needed for anxiety.   . cholecalciferol (VITAMIN D) 1000 units tablet Take 2,000 Units by mouth daily.   . divalproex (DEPAKOTE) 250 MG DR tablet   . divalproex (DEPAKOTE) 500 MG DR tablet Take 500 mg by mouth daily.   Marland Kitchen docusate sodium (COLACE) 100 MG capsule Take 100 mg by mouth 2 (two) times daily.  Marland Kitchen lidocaine-prilocaine (EMLA) cream Apply to affected area once  . LORazepam (ATIVAN) 0.5 MG tablet Take 0.5 mg by mouth every 6 (six) hours as needed for anxiety.  . memantine (NAMENDA) 10 MG tablet Take 10 mg by mouth 2 (two) times daily.  . mirtazapine (REMERON) 7.5 MG tablet Take 7.5 mg by mouth at bedtime.  . ondansetron (ZOFRAN) 4 MG tablet Take 4 mg by mouth every 8  (eight) hours as needed for nausea or vomiting.  . Pertuzumab (PERJETA IV) Inject into the vein.  Marland Kitchen prochlorperazine (COMPAZINE) 10 MG tablet Take 1 tablet (10 mg total) by mouth every 6 (six) hours as needed (Nausea or vomiting).  . QUEtiapine (SEROQUEL) 25 MG tablet Take 12.5 mg by mouth 3 (three) times daily.  . sertraline (ZOLOFT) 100 MG tablet Take 150 mg by mouth daily.   . Trastuzumab (HERCEPTIN IV) Inject into the vein.   Facility-Administered Encounter Medications as of 12/05/2018  Medication  . sodium chloride flush (NS) 0.9 % injection 10 mL    ALLERGIES:  No Known Allergies   PHYSICAL EXAM:  ECOG Performance status: 2  Vitals:   12/05/18 1037  BP: 122/67  Pulse: 68  Resp: 16  Temp: 98.3 F (36.8 C)  SpO2: 99%   Filed Weights   12/05/18 1037  Weight: 151 lb 6.4 oz (68.7 kg)    Physical Exam Vitals [name] reviewed.  Constitutional:      Appearance: Normal appearance.  Cardiovascular:     Rate and Rhythm: Normal rate and regular rhythm.     Heart sounds: Normal heart sounds.  Pulmonary:     Effort: Pulmonary effort is normal.     Breath sounds: Normal breath sounds.  Abdominal:     General: Bowel sounds are normal. There is no distension.     Palpations: Abdomen is soft.  Musculoskeletal:        General: No swelling.  Skin:    General: Skin is warm.  Neurological:     General: No focal deficit present.     Mental Status: She is alert and oriented to person, place, and time.  Psychiatric:        Mood and Affect: Mood normal.        Behavior: Behavior normal.      LABORATORY DATA:  I have reviewed the labs as listed.  CBC    Component Value Date/Time   WBC 7.6 12/05/2018 0940   RBC 4.62 12/05/2018 0940   HGB 12.8 12/05/2018 0940   HCT 41.5 12/05/2018 0940   PLT 275 12/05/2018 0940   MCV 89.8 12/05/2018 0940   MCH 27.7 12/05/2018 0940   MCHC 30.8 12/05/2018 0940   RDW 13.9 12/05/2018 0940   LYMPHSABS 1.6 12/05/2018 0940   MONOABS 0.4  12/05/2018 0940   EOSABS 0.2 12/05/2018 0940   BASOSABS 0.0 12/05/2018 0940   CMP Latest Ref Rng & Units 12/05/2018 11/09/2018 10/19/2018  Glucose 70 - 99 mg/dL 112(H) 124(H) 91  BUN 8 - 23 mg/dL '15 15 16  ' Creatinine 0.44 - 1.00 mg/dL 0.52 0.56 0.58  Sodium 135 - 145 mmol/L 139 138 140  Potassium 3.5 - 5.1 mmol/L 3.6 4.0 4.3  Chloride 98 - 111 mmol/L 101 103 106  CO2 22 - 32 mmol/L '27 27 25  ' Calcium 8.9 - 10.3 mg/dL 9.1 9.1 9.2  Total Protein 6.5 - 8.1 g/dL 7.8 7.1 7.0  Total Bilirubin 0.3 - 1.2 mg/dL 0.7 0.2(L) 0.2(L)  Alkaline Phos 38 - 126 U/L 85 75 72  AST 15 - 41 U/L 16 15 11(L)  ALT 0 - 44 U/L '11 10 9       ' DIAGNOSTIC IMAGING:  I have independently reviewed the scans and discussed with the patient.   I have reviewed [name] Lick LPN's note and agree with the documentation.  I personally performed a face-to-face visit, made revisions and my assessment and plan is as follows.    ASSESSMENT & PLAN:   HER2-positive carcinoma of left breast ([place]) 1.  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 - PET/CT scan on 05/11/2018 shows bilateral hypermetabolic pulmonary nodules consistent with pulmonary metastasis with no metastatic disease outside of the thorax. - On 05/23/2018, lung biopsy shows poorly differentiated metastatic carcinoma, consistent with breast primary. - Echocardiogram on 05/03/2018 shows ejection fraction of 60 to 65%. - 6 cycles of docetaxel, Herceptin and Pertuzumab from 06/05/2018 through 09/18/2018. - Maintenance Herceptin and Pertuzumab started on 10/19/2018. - PET CT scan on 09/29/2018 shows marked reduction in size and metabolic activity of bilateral  pulmonary nodules and breast mass and adenopathy. -She is tolerating Herceptin and Pertuzumab very well.  She has some dry cough today. -She denies any GI  symptoms including diarrhea. -I reviewed results of the MUGA scan from 11/30/2018 which showed EF of 61%. -I have reviewed her labs.  She may proceed with her next cycle of Herceptin and Pertuzumab today. -I plan to repeat PET scan in 3 months from the last scan.       Orders placed this encounter:  No orders of the defined types were placed in this encounter.     Derek Jack, MD [place] [phone]

## 2018-12-05 NOTE — Assessment & Plan Note (Signed)
1.  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 - PET/CT scan on 05/11/2018 shows bilateral hypermetabolic pulmonary nodules consistent with pulmonary metastasis with no metastatic disease outside of the thorax. - On 05/23/2018, lung biopsy shows poorly differentiated metastatic carcinoma, consistent with breast primary. - Echocardiogram on 05/03/2018 shows ejection fraction of 60 to 65%. - 6 cycles of docetaxel, Herceptin and Pertuzumab from 06/05/2018 through 09/18/2018. - Maintenance Herceptin and Pertuzumab started on 10/19/2018. - PET CT scan on 09/29/2018 shows marked reduction in size and metabolic activity of bilateral pulmonary nodules and breast mass and adenopathy. -She is tolerating Herceptin and Pertuzumab very well.  She has some dry cough today. -She denies any GI symptoms including diarrhea. -I reviewed results of the MUGA scan from 11/30/2018 which showed EF of 61%. -I have reviewed her labs.  She may proceed with her next cycle of Herceptin and Pertuzumab today. -I plan to repeat PET scan in 3 months from the last scan.

## 2018-12-05 NOTE — Patient Instructions (Signed)
[facility] at [facility]  Discharge Instructions:   _______________________________________________________________  Thank you for choosing [facility] at [facility] to provide your oncology and hematology care.  To afford each patient quality time with our providers, please arrive at least 15 minutes before your scheduled appointment.  You need to re-schedule your appointment if you arrive 10 or more minutes late.  We strive to give you quality time with our providers, and arriving late affects you and other patients whose appointments are after yours.  Also, if you no show three or more times for appointments you may be dismissed from the clinic.  Again, thank you for choosing [facility] at [facility]. Our hope is that these requests will allow you access to exceptional care and in a timely manner. _______________________________________________________________  If you have questions after your visit, please contact our office at [phone] between the hours of 8:30 a.m. and 5:00 p.m. Voicemails left after 4:30 p.m. will not be returned until the following business day. _______________________________________________________________  For prescription refill requests, have your pharmacy contact our office. _______________________________________________________________  Recommendations made by the consultant and any test results will be sent to your referring physician. _______________________________________________________________ 

## 2018-12-05 NOTE — Progress Notes (Signed)
Patient seen by Dr. Katragadda with lab review and ok to treat today.   Patient tolerated chemotherapy with no complaints voiced.  Port site clean and dry with no bruising or swelling noted at site.  Good blood return noted before and after administration of chemotherapy.  Band aid applied.  Patient left ambulatory with VSS and no s/s of distress noted.  

## 2018-12-05 NOTE — Patient Instructions (Signed)
[place] at [facility] Discharge Instructions  You were seen today by Dr. Delton Coombes. He went over your recent lab and test results. He will see you back in 3 weeks for labs and follow up.   Thank you for choosing [place] at [facility] to provide your oncology and hematology care.  To afford each patient quality time with our provider, please arrive at least 15 minutes before your scheduled appointment time.   If you have a lab appointment with the [facility] please come in thru the  Main Entrance and check in at the main information desk  You need to re-schedule your appointment should you arrive 10 or more minutes late.  We strive to give you quality time with our providers, and arriving late affects you and other patients whose appointments are after yours.  Also, if you no show three or more times for appointments you may be dismissed from the clinic at the providers discretion.     Again, thank you for choosing [facility].  Our hope is that these requests will decrease the amount of time that you wait before being seen by our physicians.       _____________________________________________________________  Should you have questions after your visit to [facility], please contact our office at [phone] between the hours of 8:00 a.m. and 4:30 p.m.  Voicemails left after 4:00 p.m. will not be returned until the following business day.  For prescription refill requests, have your pharmacy contact our office and allow 72 hours.    Cancer Center Support Programs:   > Cancer Support Group  2nd Tuesday of the month 1pm-2pm, Journey Room

## 2018-12-12 DIAGNOSIS — F062 Psychotic disorder with delusions due to known physiological condition: Secondary | ICD-10-CM | POA: Diagnosis not present

## 2018-12-12 DIAGNOSIS — F0281 Dementia in other diseases classified elsewhere with behavioral disturbance: Secondary | ICD-10-CM | POA: Diagnosis not present

## 2018-12-12 DIAGNOSIS — F331 Major depressive disorder, recurrent, moderate: Secondary | ICD-10-CM | POA: Diagnosis not present

## 2018-12-12 DIAGNOSIS — G300 Alzheimer's disease with early onset: Secondary | ICD-10-CM | POA: Diagnosis not present

## 2018-12-12 DIAGNOSIS — F064 Anxiety disorder due to known physiological condition: Secondary | ICD-10-CM | POA: Diagnosis not present

## 2018-12-14 DIAGNOSIS — F064 Anxiety disorder due to known physiological condition: Secondary | ICD-10-CM | POA: Diagnosis not present

## 2018-12-14 DIAGNOSIS — F0281 Dementia in other diseases classified elsewhere with behavioral disturbance: Secondary | ICD-10-CM | POA: Diagnosis not present

## 2018-12-14 DIAGNOSIS — F062 Psychotic disorder with delusions due to known physiological condition: Secondary | ICD-10-CM | POA: Diagnosis not present

## 2018-12-14 DIAGNOSIS — F331 Major depressive disorder, recurrent, moderate: Secondary | ICD-10-CM | POA: Diagnosis not present

## 2018-12-14 DIAGNOSIS — G300 Alzheimer's disease with early onset: Secondary | ICD-10-CM | POA: Diagnosis not present

## 2018-12-15 DIAGNOSIS — F320 Major depressive disorder, single episode, mild: Secondary | ICD-10-CM | POA: Diagnosis not present

## 2018-12-15 DIAGNOSIS — F419 Anxiety disorder, unspecified: Secondary | ICD-10-CM | POA: Diagnosis not present

## 2018-12-15 DIAGNOSIS — K5900 Constipation, unspecified: Secondary | ICD-10-CM | POA: Diagnosis not present

## 2018-12-15 DIAGNOSIS — F0391 Unspecified dementia with behavioral disturbance: Secondary | ICD-10-CM | POA: Diagnosis not present

## 2018-12-15 DIAGNOSIS — C50812 Malignant neoplasm of overlapping sites of left female breast: Secondary | ICD-10-CM | POA: Diagnosis not present

## 2018-12-15 DIAGNOSIS — G8929 Other chronic pain: Secondary | ICD-10-CM | POA: Diagnosis not present

## 2018-12-15 DIAGNOSIS — F424 Excoriation (skin-picking) disorder: Secondary | ICD-10-CM | POA: Diagnosis not present

## 2018-12-15 DIAGNOSIS — E559 Vitamin D deficiency, unspecified: Secondary | ICD-10-CM | POA: Diagnosis not present

## 2018-12-15 DIAGNOSIS — M6281 Muscle weakness (generalized): Secondary | ICD-10-CM | POA: Diagnosis not present

## 2018-12-15 DIAGNOSIS — R54 Age-related physical debility: Secondary | ICD-10-CM | POA: Diagnosis not present

## 2018-12-19 DIAGNOSIS — M6281 Muscle weakness (generalized): Secondary | ICD-10-CM | POA: Diagnosis not present

## 2018-12-19 DIAGNOSIS — R112 Nausea with vomiting, unspecified: Secondary | ICD-10-CM | POA: Diagnosis not present

## 2018-12-19 DIAGNOSIS — R630 Anorexia: Secondary | ICD-10-CM | POA: Diagnosis not present

## 2018-12-19 DIAGNOSIS — F0391 Unspecified dementia with behavioral disturbance: Secondary | ICD-10-CM | POA: Diagnosis not present

## 2018-12-19 DIAGNOSIS — R54 Age-related physical debility: Secondary | ICD-10-CM | POA: Diagnosis not present

## 2018-12-19 DIAGNOSIS — K5900 Constipation, unspecified: Secondary | ICD-10-CM | POA: Diagnosis not present

## 2018-12-19 DIAGNOSIS — F419 Anxiety disorder, unspecified: Secondary | ICD-10-CM | POA: Diagnosis not present

## 2018-12-19 DIAGNOSIS — G8929 Other chronic pain: Secondary | ICD-10-CM | POA: Diagnosis not present

## 2018-12-19 DIAGNOSIS — E559 Vitamin D deficiency, unspecified: Secondary | ICD-10-CM | POA: Diagnosis not present

## 2018-12-19 DIAGNOSIS — F320 Major depressive disorder, single episode, mild: Secondary | ICD-10-CM | POA: Diagnosis not present

## 2018-12-19 DIAGNOSIS — C50812 Malignant neoplasm of overlapping sites of left female breast: Secondary | ICD-10-CM | POA: Diagnosis not present

## 2018-12-26 ENCOUNTER — Inpatient Hospital Stay (HOSPITAL_COMMUNITY): Payer: Medicare Other

## 2018-12-26 ENCOUNTER — Inpatient Hospital Stay (HOSPITAL_COMMUNITY): Payer: Medicare Other | Attending: Hematology

## 2018-12-26 ENCOUNTER — Other Ambulatory Visit: Payer: Self-pay

## 2018-12-26 ENCOUNTER — Encounter (HOSPITAL_COMMUNITY): Payer: Self-pay | Admitting: Hematology

## 2018-12-26 ENCOUNTER — Inpatient Hospital Stay (HOSPITAL_BASED_OUTPATIENT_CLINIC_OR_DEPARTMENT_OTHER): Payer: Medicare Other | Admitting: Hematology

## 2018-12-26 VITALS — BP 108/55 | HR 59 | Temp 97.8°F | Resp 18

## 2018-12-26 VITALS — BP 117/70 | HR 71 | Temp 98.4°F

## 2018-12-26 DIAGNOSIS — Z171 Estrogen receptor negative status [ER-]: Secondary | ICD-10-CM

## 2018-12-26 DIAGNOSIS — C7801 Secondary malignant neoplasm of right lung: Secondary | ICD-10-CM | POA: Diagnosis not present

## 2018-12-26 DIAGNOSIS — Z79899 Other long term (current) drug therapy: Secondary | ICD-10-CM

## 2018-12-26 DIAGNOSIS — C50812 Malignant neoplasm of overlapping sites of left female breast: Secondary | ICD-10-CM | POA: Diagnosis not present

## 2018-12-26 DIAGNOSIS — C7802 Secondary malignant neoplasm of left lung: Secondary | ICD-10-CM

## 2018-12-26 DIAGNOSIS — C50912 Malignant neoplasm of unspecified site of left female breast: Secondary | ICD-10-CM

## 2018-12-26 DIAGNOSIS — Z5112 Encounter for antineoplastic immunotherapy: Secondary | ICD-10-CM | POA: Insufficient documentation

## 2018-12-26 LAB — COMPREHENSIVE METABOLIC PANEL
ALT: 11 U/L (ref 0–44)
AST: 14 U/L — ABNORMAL LOW (ref 15–41)
Albumin: 3.5 g/dL (ref 3.5–5.0)
Alkaline Phosphatase: 75 U/L (ref 38–126)
Anion gap: 8 (ref 5–15)
BUN: 16 mg/dL (ref 8–23)
CO2: 28 mmol/L (ref 22–32)
Calcium: 9 mg/dL (ref 8.9–10.3)
Chloride: 104 mmol/L (ref 98–111)
Creatinine, Ser: 0.55 mg/dL (ref 0.44–1.00)
GFR calc Af Amer: >60 mL/min (ref >60)
GFR calc non Af Amer: >60 mL/min (ref >60)
Glucose, Bld: 101 mg/dL — ABNORMAL HIGH (ref 70–99)
Potassium: 4.1 mmol/L (ref 3.5–5.1)
Sodium: 140 mmol/L (ref 135–145)
Total Bilirubin: 0.4 mg/dL (ref 0.3–1.2)
Total Protein: 7.3 g/dL (ref 6.5–8.1)

## 2018-12-26 LAB — CBC WITH DIFFERENTIAL/PLATELET
Abs Immature Granulocytes: 0.01 10*3/uL (ref 0.00–0.07)
Basophils Absolute: 0 10*3/uL (ref 0.0–0.1)
Basophils Relative: 0 %
Eosinophils Absolute: 0.2 10*3/uL (ref 0.0–0.5)
Eosinophils Relative: 3 %
HCT: 39.2 % (ref 36.0–46.0)
Hemoglobin: 12.2 g/dL (ref 12.0–15.0)
Immature Granulocytes: 0 %
Lymphocytes Relative: 27 %
Lymphs Abs: 1.6 10*3/uL (ref 0.7–4.0)
MCH: 28.1 pg (ref 26.0–34.0)
MCHC: 31.1 g/dL (ref 30.0–36.0)
MCV: 90.3 fL (ref 80.0–100.0)
Monocytes Absolute: 0.4 10*3/uL (ref 0.1–1.0)
Monocytes Relative: 7 %
Neutro Abs: 3.6 10*3/uL (ref 1.7–7.7)
Neutrophils Relative %: 63 %
Platelets: 203 10*3/uL (ref 150–400)
RBC: 4.34 MIL/uL (ref 3.87–5.11)
RDW: 14.6 % (ref 11.5–15.5)
WBC: 5.8 10*3/uL (ref 4.0–10.5)
nRBC: 0 % (ref 0.0–0.2)

## 2018-12-26 MED ORDER — SODIUM CHLORIDE 0.9 % IV SOLN
420.0000 mg | Freq: Once | INTRAVENOUS | Status: AC
  Administered 2018-12-26: 420 mg via INTRAVENOUS
  Filled 2018-12-26: qty 14

## 2018-12-26 MED ORDER — SODIUM CHLORIDE 0.9% FLUSH
10.0000 mL | INTRAVENOUS | Status: DC | PRN
  Administered 2018-12-26: 10 mL
  Filled 2018-12-26: qty 10

## 2018-12-26 MED ORDER — DIPHENHYDRAMINE HCL 25 MG PO CAPS
50.0000 mg | ORAL_CAPSULE | Freq: Once | ORAL | Status: AC
  Administered 2018-12-26: 50 mg via ORAL
  Filled 2018-12-26: qty 2

## 2018-12-26 MED ORDER — HEPARIN SOD (PORK) LOCK FLUSH 100 UNIT/ML IV SOLN
500.0000 [IU] | Freq: Once | INTRAVENOUS | Status: AC | PRN
  Administered 2018-12-26: 500 [IU]

## 2018-12-26 MED ORDER — SODIUM CHLORIDE 0.9 % IV SOLN
Freq: Once | INTRAVENOUS | Status: AC
  Administered 2018-12-26: 10:00:00 via INTRAVENOUS

## 2018-12-26 MED ORDER — TRASTUZUMAB CHEMO 150 MG IV SOLR
450.0000 mg | Freq: Once | INTRAVENOUS | Status: AC
  Administered 2018-12-26: 450 mg via INTRAVENOUS
  Filled 2018-12-26: qty 21.43

## 2018-12-26 NOTE — Patient Instructions (Signed)
[facility] Discharge Instructions for Patients Receiving Chemotherapy  Today you received the following chemotherapy agents   To help prevent nausea and vomiting after your treatment, we encourage you to take your nausea medication   If you develop nausea and vomiting that is not controlled by your nausea medication, call the clinic.   BELOW ARE SYMPTOMS THAT SHOULD BE REPORTED IMMEDIATELY:  *FEVER GREATER THAN 100.5 F  *CHILLS WITH OR WITHOUT FEVER  NAUSEA AND VOMITING THAT IS NOT CONTROLLED WITH YOUR NAUSEA MEDICATION  *UNUSUAL SHORTNESS OF BREATH  *UNUSUAL BRUISING OR BLEEDING  TENDERNESS IN MOUTH AND THROAT WITH OR WITHOUT PRESENCE OF ULCERS  *URINARY PROBLEMS  *BOWEL PROBLEMS  UNUSUAL RASH Items with * indicate a potential emergency and should be followed up as soon as possible.  Feel free to call the clinic should you have any questions or concerns. The clinic phone number is [phone].  Please show the CHEMO ALERT CARD at check-in to the Emergency Department and triage nurse.   

## 2018-12-26 NOTE — Assessment & Plan Note (Signed)
1.  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 - PET/CT scan on 05/11/2018 shows bilateral hypermetabolic pulmonary nodules consistent with pulmonary metastasis with no metastatic disease outside of the thorax. - On 05/23/2018, lung biopsy shows poorly differentiated metastatic carcinoma, consistent with breast primary. - 6 cycles of THP from 06/05/2018 through 09/18/2018  - Maintenance Herceptin and Pertuzumab started on 10/19/2018.  - PET scan on 09/29/2018 shows marked reduction in size and metabolic activity of bilateral pulmonary nodules and breast mass and adenopathy. -She is continuing to tolerate Herceptin and Pertuzumab very well.  Denies any symptoms of PND or orthopnea. -Denies any GI symptoms including diarrhea. -MUGA scan on 11/30/2018 shows EF of 61%. -I have reviewed her labs.  She may proceed with next cycle of Herceptin and Pertuzumab today. -I will see her back in 3 weeks with repeat PET scan.

## 2018-12-26 NOTE — Patient Instructions (Signed)
[facility] at [facility] Discharge Instructions  You were seen today by Dr. Katragadda. He went over your recent lab results. He will see you back in  for labs and follow up.   Thank you for choosing [facility] at [facility] to provide your oncology and hematology care.  To afford each patient quality time with our provider, please arrive at least 15 minutes before your scheduled appointment time.   If you have a lab appointment with the Cancer Center please come in thru the  Main Entrance and check in at the main information desk  You need to re-schedule your appointment should you arrive 10 or more minutes late.  We strive to give you quality time with our providers, and arriving late affects you and other patients whose appointments are after yours.  Also, if you no show three or more times for appointments you may be dismissed from the clinic at the providers discretion.     Again, thank you for choosing [facility].  Our hope is that these requests will decrease the amount of time that you wait before being seen by our physicians.       _____________________________________________________________  Should you have questions after your visit to [facility], please contact our office at [phone] between the hours of 8:00 a.m. and 4:30 p.m.  Voicemails left after 4:00 p.m. will not be returned until the following business day.  For prescription refill requests, have your pharmacy contact our office and allow 72 hours.    Cancer Center Support Programs:   > Cancer Support Group  2nd Tuesday of the month 1pm-2pm, Journey Room    

## 2018-12-26 NOTE — Progress Notes (Signed)
Pt presents today for doctor appointment with Dr. Delton Coombes and treatment. VSS. Pt has no complaints since the last visit. MAR reviewed and updated. Upon assessment pt has sores around mouth and chin.

## 2018-12-26 NOTE — Progress Notes (Signed)
Proceed with treatment per MD. Labs reviewed by MD today at office visit.   Treatment given per orders. Patient tolerated it well without problems. Vitals stable and discharged home from clinic ambulatory. Follow up as scheduled.  Escorted patient down to person who picked her up.

## 2018-12-26 NOTE — Progress Notes (Signed)
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      BRIEF ONCOLOGIC HISTORY:    Malignant neoplasm of overlapping sites of left female breast ([place])   05/15/2018 Initial Diagnosis    Malignant neoplasm of overlapping sites of left female breast ([place])    06/05/2018 -  Chemotherapy    The patient had pegfilgrastim-cbqv (UDENYCA) injection 6 mg, 6 mg, Subcutaneous, Once, 6 of 6 cycles Administration: 6 mg (06/07/2018), 6 mg (06/28/2018), 6 mg (07/19/2018), 6 mg (08/30/2018), 6 mg (09/20/2018), 6 mg (08/10/2018) trastuzumab (HERCEPTIN) 600 mg in sodium chloride 0.9 % 250 mL chemo infusion, 609 mg, Intravenous,  Once, 10 of 13 cycles Administration: 600 mg (06/05/2018), 450 mg (06/26/2018), 450 mg (07/17/2018), 450 mg (08/28/2018), 450 mg (09/18/2018), 450 mg (08/07/2018), 450 mg (10/19/2018), 450 mg (11/09/2018), 450 mg (12/05/2018) DOCEtaxel (TAXOTERE) 140 mg in sodium chloride 0.9 % 250 mL chemo infusion, 75 mg/m2 = 140 mg, Intravenous,  Once, 6 of 6 cycles Administration: 140 mg (06/05/2018), 140 mg (06/26/2018), 140 mg (07/17/2018), 140 mg (08/28/2018), 140 mg (09/18/2018), 140 mg (08/07/2018) ondansetron (ZOFRAN) 8 mg, dexamethasone (DECADRON) 4 mg in sodium chloride 0.9 % 50 mL IVPB, , Intravenous,  Once, 6 of 6 cycles Administration:  (06/05/2018),  (06/26/2018),  (07/17/2018),  (08/28/2018),  (09/18/2018),  (08/07/2018) pertuzumab (PERJETA) 840 mg in sodium chloride 0.9 % 250 mL chemo infusion, 840 mg, Intravenous, Once, 10 of 13 cycles Administration: 840 mg (06/05/2018), 420 mg (06/26/2018), 420 mg (07/17/2018), 420 mg (08/28/2018), 420 mg (09/18/2018), 420 mg (08/07/2018), 420 mg (10/19/2018), 420 mg (11/09/2018), 420 mg (12/05/2018)  for chemotherapy  treatment.       CANCER STAGING: Cancer Staging No matching staging information was found for the patient.   INTERVAL HISTORY:  [name] [name] 63 y.o. female returns for routine follow-up and consideration for next cycle of chemotherapy. She is here today alone. She states that has noticed mouth sores on the outside of her mouth. Denies any nausea, vomiting, or diarrhea. Denies any new pains. Had not noticed any recent bleeding such as epistaxis, hematuria or hematochezia. Denies recent chest pain on exertion, shortness of breath on minimal exertion, pre-syncopal episodes, or palpitations. Denies any numbness or tingling in hands or feet. Denies any recent fevers, infections, or recent hospitalizations. Patient reports appetite at 100% and energy level at 100%.   REVIEW OF SYSTEMS:  Review of Systems  Constitutional: Positive for fatigue.  HENT:  Negative.   Eyes: Negative.   Respiratory: Negative.   Cardiovascular: Negative.   Gastrointestinal: Negative.   Endocrine: Negative.   Genitourinary: Negative.    Musculoskeletal: Negative.   Skin: Negative.   Neurological: Negative.   Hematological: Negative.   Psychiatric/Behavioral: Negative.      PAST MEDICAL/SURGICAL HISTORY:  Past Medical History:  Diagnosis Date  . Alzheimer's dementia ([place])   . Bradycardia   . Breast cancer ([place])   . Dementia ([place])   . Depression   . Hypokalemia   . Hypotension   . 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 ABDOMINAL HYSTERECTOMY    . BREAST BIOPSY Left   . PORTACATH PLACEMENT Right 05/15/2018   Procedure: INSERTION PORT-A-CATH;  Surgeon: Aviva Signs, MD;  Location: [facility];  Service: General;  Laterality: Right;     SOCIAL HISTORY:  Social History   Socioeconomic History  . Marital status: Widowed    Spouse name: Not on file  . Number of children: Not on file  . Years of education: Not on file   . Highest education level: Not on file  Occupational History  . Not on file  Social Needs  . Financial resource strain: Not on file  . Food insecurity:    Worry: Not on file    Inability: Not on file  . Transportation needs:    Medical: Not on file    Non-medical: Not on file  Tobacco Use  . Smoking status: Unknown If Ever Smoked  . Smokeless tobacco: Never Used  Substance and Sexual Activity  . Alcohol use: Not Currently  . Drug use: Never  . Sexual activity: Not Currently  Lifestyle  . Physical activity:    Days per week: Not on file    Minutes per session: Not on file  . Stress: Not on file  Relationships  . Social connections:    Talks on phone: Not on file    Gets together: Not on file    Attends religious service: Not on file    Active member of club or organization: Not on file    Attends meetings of clubs or organizations: Not on file    Relationship status: Not on file  . Intimate partner violence:    Fear of current or ex partner: Not on file    Emotionally abused: Not on file    Physically abused: Not on file    Forced sexual activity: Not on file  Other Topics Concern  . Not on file  Social History Narrative   Lives at [facility] in [place]:  History reviewed. No pertinent family history.  CURRENT MEDICATIONS:  Outpatient Encounter Medications as of 12/26/2018  Medication Sig  . Multiple Vitamins-Minerals (ONE-A-DAY WOMENS 50+ ADVANTAGE) TABS   . sertraline (ZOLOFT) 100 MG tablet Take by mouth.  Marland Kitchen acetaminophen (TYLENOL) 325 MG tablet Take 650 mg by mouth every 6 (six) hours as needed for mild pain.  Marland Kitchen ALPRAZolam (XANAX) 0.25 MG tablet Take 0.25 mg by mouth every 8 (eight) hours as needed for anxiety.   . cholecalciferol (VITAMIN D) 1000 units tablet Take 2,000 Units by mouth daily.   . divalproex (DEPAKOTE) 250 MG DR tablet   . divalproex (DEPAKOTE) 500 MG DR tablet Take 500 mg by mouth daily.   Marland Kitchen docusate sodium (COLACE) 100  MG capsule Take 100 mg by mouth 2 (two) times daily.  Marland Kitchen donepezil (ARICEPT) 10 MG tablet   . lidocaine-prilocaine (EMLA) cream Apply to affected area once  . LORazepam (ATIVAN) 0.5 MG tablet Take 0.5 mg by mouth every 6 (six) hours as needed for anxiety.  . memantine (NAMENDA) 10 MG tablet Take 10 mg by mouth 2 (two) times daily.  . mirtazapine (REMERON) 7.5 MG tablet Take 7.5 mg by mouth at bedtime.  . mupirocin ointment (BACTROBAN) 2 %   . ondansetron (ZOFRAN) 4 MG tablet Take 4 mg by mouth every 8 (eight) hours as needed for nausea or vomiting.  . Pertuzumab (PERJETA IV) Inject into the vein.  Marland Kitchen prochlorperazine (COMPAZINE) 10 MG tablet Take 1 tablet (10 mg total) by mouth every 6 (six) hours as needed (Nausea or vomiting).  . QUEtiapine (SEROQUEL) 25 MG tablet Take 12.5  mg by mouth 3 (three) times daily.  . sertraline (ZOLOFT) 100 MG tablet Take 150 mg by mouth daily.   . sertraline (ZOLOFT) 50 MG tablet   . Trastuzumab (HERCEPTIN IV) Inject into the vein.   Facility-Administered Encounter Medications as of 12/26/2018  Medication  . sodium chloride flush (NS) 0.9 % injection 10 mL    ALLERGIES:  No Known Allergies   PHYSICAL EXAM:  ECOG Performance status: 1  Vitals:   12/26/18 0920  BP: 117/70  Pulse: 71  Temp: 98.4 F (36.9 C)  SpO2: 98%   There were no vitals filed for this visit.  Physical Exam Vitals signs reviewed.  Constitutional:      Appearance: Normal appearance.  Cardiovascular:     Rate and Rhythm: Normal rate and regular rhythm.     Heart sounds: Normal heart sounds.  Pulmonary:     Effort: Pulmonary effort is normal.     Breath sounds: Normal breath sounds.  Abdominal:     General: There is no distension.     Palpations: Abdomen is soft. There is no mass.  Musculoskeletal:        General: No swelling.  Skin:    General: Skin is warm.  Neurological:     General: No focal deficit present.     Mental Status: She is alert and oriented to person,  place, and time.  Psychiatric:        Mood and Affect: Mood normal.        Behavior: Behavior normal.      LABORATORY DATA:  I have reviewed the labs as listed.  CBC    Component Value Date/Time   WBC 5.8 12/26/2018 0855   RBC 4.34 12/26/2018 0855   HGB 12.2 12/26/2018 0855   HCT 39.2 12/26/2018 0855   PLT 203 12/26/2018 0855   MCV 90.3 12/26/2018 0855   MCH 28.1 12/26/2018 0855   MCHC 31.1 12/26/2018 0855   RDW 14.6 12/26/2018 0855   LYMPHSABS 1.6 12/26/2018 0855   MONOABS 0.4 12/26/2018 0855   EOSABS 0.2 12/26/2018 0855   BASOSABS 0.0 12/26/2018 0855   CMP Latest Ref Rng & Units 12/26/2018 12/05/2018 11/09/2018  Glucose 70 - 99 mg/dL 101(H) 112(H) 124(H)  BUN 8 - 23 mg/dL '16 15 15  ' Creatinine 0.44 - 1.00 mg/dL 0.55 0.52 0.56  Sodium 135 - 145 mmol/L 140 139 138  Potassium 3.5 - 5.1 mmol/L 4.1 3.6 4.0  Chloride 98 - 111 mmol/L 104 101 103  CO2 22 - 32 mmol/L '28 27 27  ' Calcium 8.9 - 10.3 mg/dL 9.0 9.1 9.1  Total Protein 6.5 - 8.1 g/dL 7.3 7.8 7.1  Total Bilirubin 0.3 - 1.2 mg/dL 0.4 0.7 0.2(L)  Alkaline Phos 38 - 126 U/L 75 85 75  AST 15 - 41 U/L 14(L) 16 15  ALT 0 - 44 U/L '11 11 10       ' DIAGNOSTIC IMAGING:  I have independently reviewed the scans and discussed with the patient.   I have reviewed Venita Lick LPN's note and agree with the documentation.  I personally performed a face-to-face visit, made revisions and my assessment and plan is as follows.    ASSESSMENT & PLAN:   HER2-positive carcinoma of left breast ([place]) 1.  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 - PET/CT scan on 05/11/2018  shows bilateral hypermetabolic pulmonary nodules consistent with pulmonary metastasis with no metastatic disease outside of the thorax. - On 05/23/2018, lung biopsy shows poorly differentiated metastatic  carcinoma, consistent with breast primary. - 6 cycles of THP from 06/05/2018 through 09/18/2018  - Maintenance Herceptin and Pertuzumab started on 10/19/2018.  - PET scan on 09/29/2018 shows marked reduction in size and metabolic activity of bilateral pulmonary nodules and breast mass and adenopathy. -She is continuing to tolerate Herceptin and Pertuzumab very well.  Denies any symptoms of PND or orthopnea. -Denies any GI symptoms including diarrhea. -MUGA scan on 11/30/2018 shows EF of 61%. -I have reviewed her labs.  She may proceed with next cycle of Herceptin and Pertuzumab today. -I will see her back in 3 weeks with repeat PET scan.   Total time spent is 25 minutes with more than 50% of time spent face-to-face discussing treatment plan, side effects and coordination of care.    Orders placed this encounter:  Orders Placed This Encounter  Procedures  . NM PET Image Restag (PS) Skull Base To Thigh      Derek Jack, MD [place] [phone]

## 2019-01-01 ENCOUNTER — Ambulatory Visit (HOSPITAL_COMMUNITY)
Admission: RE | Admit: 2019-01-01 | Discharge: 2019-01-01 | Disposition: A | Discharge status: Home / Self Care | Payer: Medicare Other | Source: Ambulatory Visit | Attending: Hematology | Admitting: Hematology

## 2019-01-01 ENCOUNTER — Other Ambulatory Visit: Payer: Self-pay

## 2019-01-01 DIAGNOSIS — C50912 Malignant neoplasm of unspecified site of left female breast: Secondary | ICD-10-CM | POA: Diagnosis not present

## 2019-01-01 DIAGNOSIS — C7801 Secondary malignant neoplasm of right lung: Secondary | ICD-10-CM | POA: Diagnosis not present

## 2019-01-01 DIAGNOSIS — C773 Secondary and unspecified malignant neoplasm of axilla and upper limb lymph nodes: Secondary | ICD-10-CM | POA: Diagnosis not present

## 2019-01-01 DIAGNOSIS — Z171 Estrogen receptor negative status [ER-]: Secondary | ICD-10-CM | POA: Diagnosis not present

## 2019-01-01 DIAGNOSIS — C50812 Malignant neoplasm of overlapping sites of left female breast: Secondary | ICD-10-CM | POA: Diagnosis not present

## 2019-01-01 DIAGNOSIS — C7802 Secondary malignant neoplasm of left lung: Secondary | ICD-10-CM | POA: Diagnosis not present

## 2019-01-01 MED ORDER — FLUDEOXYGLUCOSE F - 18 (FDG) INJECTION
7.6100 | Freq: Once | INTRAVENOUS | Status: AC | PRN
  Administered 2019-01-01: 7.61 via INTRAVENOUS

## 2019-01-02 DIAGNOSIS — G300 Alzheimer's disease with early onset: Secondary | ICD-10-CM | POA: Diagnosis not present

## 2019-01-02 DIAGNOSIS — F331 Major depressive disorder, recurrent, moderate: Secondary | ICD-10-CM | POA: Diagnosis not present

## 2019-01-02 DIAGNOSIS — F0281 Dementia in other diseases classified elsewhere with behavioral disturbance: Secondary | ICD-10-CM | POA: Diagnosis not present

## 2019-01-02 DIAGNOSIS — F064 Anxiety disorder due to known physiological condition: Secondary | ICD-10-CM | POA: Diagnosis not present

## 2019-01-02 DIAGNOSIS — F062 Psychotic disorder with delusions due to known physiological condition: Secondary | ICD-10-CM | POA: Diagnosis not present

## 2019-01-18 ENCOUNTER — Inpatient Hospital Stay (HOSPITAL_COMMUNITY): Payer: Medicare Other

## 2019-01-18 ENCOUNTER — Other Ambulatory Visit: Payer: Self-pay

## 2019-01-18 ENCOUNTER — Inpatient Hospital Stay (HOSPITAL_BASED_OUTPATIENT_CLINIC_OR_DEPARTMENT_OTHER): Payer: Medicare Other | Admitting: Hematology

## 2019-01-18 ENCOUNTER — Encounter (HOSPITAL_COMMUNITY): Payer: Self-pay | Admitting: Hematology

## 2019-01-18 DIAGNOSIS — Z171 Estrogen receptor negative status [ER-]: Secondary | ICD-10-CM | POA: Diagnosis not present

## 2019-01-18 DIAGNOSIS — C50812 Malignant neoplasm of overlapping sites of left female breast: Secondary | ICD-10-CM

## 2019-01-18 DIAGNOSIS — C7802 Secondary malignant neoplasm of left lung: Secondary | ICD-10-CM | POA: Diagnosis not present

## 2019-01-18 DIAGNOSIS — Z79899 Other long term (current) drug therapy: Secondary | ICD-10-CM | POA: Diagnosis not present

## 2019-01-18 DIAGNOSIS — F0280 Dementia in other diseases classified elsewhere without behavioral disturbance: Secondary | ICD-10-CM | POA: Diagnosis not present

## 2019-01-18 DIAGNOSIS — C7801 Secondary malignant neoplasm of right lung: Secondary | ICD-10-CM

## 2019-01-18 DIAGNOSIS — G309 Alzheimer's disease, unspecified: Secondary | ICD-10-CM | POA: Diagnosis not present

## 2019-01-18 DIAGNOSIS — Z5112 Encounter for antineoplastic immunotherapy: Secondary | ICD-10-CM | POA: Diagnosis not present

## 2019-01-18 DIAGNOSIS — C50912 Malignant neoplasm of unspecified site of left female breast: Secondary | ICD-10-CM

## 2019-01-18 DIAGNOSIS — E876 Hypokalemia: Secondary | ICD-10-CM | POA: Diagnosis not present

## 2019-01-18 LAB — CBC WITH DIFFERENTIAL/PLATELET
Abs Immature Granulocytes: 0.02 10*3/uL (ref 0.00–0.07)
Basophils Absolute: 0 10*3/uL (ref 0.0–0.1)
Basophils Relative: 0 %
Eosinophils Absolute: 0.1 10*3/uL (ref 0.0–0.5)
Eosinophils Relative: 1 %
HCT: 41.2 % (ref 36.0–46.0)
Hemoglobin: 12.8 g/dL (ref 12.0–15.0)
Immature Granulocytes: 0 %
Lymphocytes Relative: 22 %
Lymphs Abs: 1.3 10*3/uL (ref 0.7–4.0)
MCH: 27.4 pg (ref 26.0–34.0)
MCHC: 31.1 g/dL (ref 30.0–36.0)
MCV: 88 fL (ref 80.0–100.0)
Monocytes Absolute: 0.4 10*3/uL (ref 0.1–1.0)
Monocytes Relative: 6 %
Neutro Abs: 4 10*3/uL (ref 1.7–7.7)
Neutrophils Relative %: 71 %
Platelets: 239 10*3/uL (ref 150–400)
RBC: 4.68 MIL/uL (ref 3.87–5.11)
RDW: 14.9 % (ref 11.5–15.5)
WBC: 5.8 10*3/uL (ref 4.0–10.5)
nRBC: 0 % (ref 0.0–0.2)

## 2019-01-18 LAB — COMPREHENSIVE METABOLIC PANEL
ALT: 12 U/L (ref 0–44)
AST: 16 U/L (ref 15–41)
Albumin: 3.9 g/dL (ref 3.5–5.0)
Alkaline Phosphatase: 80 U/L (ref 38–126)
Anion gap: 12 (ref 5–15)
BUN: 14 mg/dL (ref 8–23)
CO2: 27 mmol/L (ref 22–32)
Calcium: 9.5 mg/dL (ref 8.9–10.3)
Chloride: 101 mmol/L (ref 98–111)
Creatinine, Ser: 0.56 mg/dL (ref 0.44–1.00)
GFR calc Af Amer: >60 mL/min (ref >60)
GFR calc non Af Amer: >60 mL/min (ref >60)
Glucose, Bld: 82 mg/dL (ref 70–99)
Potassium: 4.2 mmol/L (ref 3.5–5.1)
Sodium: 140 mmol/L (ref 135–145)
Total Bilirubin: 0.3 mg/dL (ref 0.3–1.2)
Total Protein: 7.7 g/dL (ref 6.5–8.1)

## 2019-01-18 MED ORDER — SODIUM CHLORIDE 0.9% FLUSH
10.0000 mL | Freq: Once | INTRAVENOUS | Status: AC
  Administered 2019-01-18: 10 mL

## 2019-01-18 MED ORDER — HEPARIN SOD (PORK) LOCK FLUSH 100 UNIT/ML IV SOLN
500.0000 [IU] | Freq: Once | INTRAVENOUS | Status: AC
  Administered 2019-01-18: 500 [IU] via INTRAVENOUS

## 2019-01-18 NOTE — Progress Notes (Signed)
° °[facility] °[address] 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  °  °BRIEF ONCOLOGIC HISTORY:  °  °Malignant neoplasm of overlapping sites of left female breast (HCC)  ° 05/15/2018 Initial Diagnosis  °  Malignant neoplasm of overlapping sites of left female breast (HCC) °  ° 06/05/2018 - 01/17/2019 Chemotherapy  °  The patient had pegfilgrastim-cbqv (UDENYCA) injection 6 mg, 6 mg, Subcutaneous, Once, 6 of 6 cycles °Administration: 6 mg (06/07/2018), 6 mg (06/28/2018), 6 mg (07/19/2018), 6 mg (08/30/2018), 6 mg (09/20/2018), 6 mg (08/10/2018) °trastuzumab (HERCEPTIN) 600 mg in sodium chloride 0.9 % 250 mL chemo infusion, 609 mg, Intravenous,  Once, 10 of 13 cycles °Administration: 600 mg (06/05/2018), 450 mg (06/26/2018), 450 mg (07/17/2018), 450 mg (08/28/2018), 450 mg (09/18/2018), 450 mg (08/07/2018), 450 mg (10/19/2018), 450 mg (11/09/2018), 450 mg (12/05/2018), 450 mg (12/26/2018) °DOCEtaxel (TAXOTERE) 140 mg in sodium chloride 0.9 % 250 mL chemo infusion, 75 mg/m2 = 140 mg, Intravenous,  Once, 6 of 6 cycles °Administration: 140 mg (06/05/2018), 140 mg (06/26/2018), 140 mg (07/17/2018), 140 mg (08/28/2018), 140 mg (09/18/2018), 140 mg (08/07/2018) °ondansetron (ZOFRAN) 8 mg, dexamethasone (DECADRON) 4 mg in sodium chloride 0.9 % 50 mL IVPB, , Intravenous,  Once, 6 of 6 cycles °Administration:  (06/05/2018),  (06/26/2018),  (07/17/2018),  (08/28/2018),  (09/18/2018),  (08/07/2018) °pertuzumab (PERJETA) 840 mg in sodium chloride 0.9 % 250 mL chemo infusion, 840 mg, Intravenous, Once, 10 of 13 cycles °Administration: 840 mg (06/05/2018), 420 mg (06/26/2018), 420 mg (07/17/2018), 420 mg (08/28/2018), 420 mg (09/18/2018), 420 mg (08/07/2018), 420 mg (10/19/2018), 420 mg (11/09/2018), 420 mg (12/05/2018),  420 mg (12/26/2018) ° for chemotherapy treatment.  °  °  °HER2-positive carcinoma of left breast (HCC)  ° 05/30/2018 Initial Diagnosis  °  HER2-positive carcinoma of left breast (HCC) °  ° 01/26/2019 -  Chemotherapy  °  The patient had ADO-TRASTUZUMAB EMTANSINE CHEMO IV INFUSION, 3.6 mg/kg, Intravenous, Once, 0 of 5 cycles ° for chemotherapy treatment.  °  ° ° ° °CANCER STAGING: °Cancer Staging °No matching staging information was found for the patient. ° ° °INTERVAL HISTORY:  °Ms. Gehring 63 y.o. female returns for routine follow-up and consideration for next cycle of chemotherapy. She is here today alone. She states that she has been feeling good since her last visit. Denies any nausea, vomiting, or diarrhea. Denies any new pains. Had not noticed any recent bleeding such as epistaxis, hematuria or hematochezia. Denies recent chest pain on exertion, shortness of breath on minimal exertion, pre-syncopal episodes, or palpitations. Denies any numbness or tingling in hands or feet. Denies any recent fevers, infections, or recent hospitalizations. Patient reports appetite at 100% and energy level at 50%. ° ° ° °REVIEW OF SYSTEMS:  °Review of Systems  °All other systems reviewed and are negative. 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   ° °Past Surgical History:  °Procedure Laterality Date  °• ABDOMINAL HYSTERECTOMY    °• BREAST BIOPSY Left   °•   PORTACATH PLACEMENT Right 05/15/2018   Procedure: INSERTION PORT-A-CATH;  Surgeon: Aviva Signs, MD;  Location: [facility];  Service: General;  Laterality: Right;     SOCIAL HISTORY:  Social History   Socioeconomic History   Marital status: Widowed    Spouse name: Not on file   Number of children: Not on file   Years of  education: Not on file   Highest education level: Not on file  Occupational History   Not on file  Social Needs   Financial resource strain: Not on file   Food insecurity:    Worry: Not on file    Inability: Not on file   Transportation needs:    Medical: Not on file    Non-medical: Not on file  Tobacco Use   Smoking status: Unknown If Ever Smoked   Smokeless tobacco: Never Used  Substance and Sexual Activity   Alcohol use: Not Currently   Drug use: Never   Sexual activity: Not Currently  Lifestyle   Physical activity:    Days per week: Not on file    Minutes per session: Not on file   Stress: Not on file  Relationships   Social connections:    Talks on phone: Not on file    Gets together: Not on file    Attends religious service: Not on file    Active member of club or organization: Not on file    Attends meetings of clubs or organizations: Not on file    Relationship status: Not on file   Intimate partner violence:    Fear of current or ex partner: Not on file    Emotionally abused: Not on file    Physically abused: Not on file    Forced sexual activity: Not on file  Other Topics Concern   Not on file  Social History Narrative   Lives at [facility] in [place]:  History reviewed. No pertinent family history.  CURRENT MEDICATIONS:  Outpatient Encounter Medications as of 01/18/2019  Medication Sig   acetaminophen (TYLENOL) 325 MG tablet Take 650 mg by mouth every 6 (six) hours as needed for mild pain.   ALPRAZolam (XANAX) 0.25 MG tablet Take 0.25 mg by mouth every 8 (eight) hours as needed for anxiety.    cholecalciferol (VITAMIN D) 1000 units tablet Take 2,000 Units by mouth daily.    divalproex (DEPAKOTE) 250 MG DR tablet    docusate sodium (COLACE) 100 MG capsule Take 100 mg by mouth 2 (two) times daily.   donepezil (ARICEPT) 10 MG tablet    memantine (NAMENDA) 10 MG tablet Take 10 mg by mouth 2 (two) times daily.     mirtazapine (REMERON) 7.5 MG tablet Take 7.5 mg by mouth at bedtime.   Multiple Vitamins-Minerals (ONE-A-DAY WOMENS 50+ ADVANTAGE) TABS    mupirocin ointment (BACTROBAN) 2 %    ondansetron (ZOFRAN) 4 MG tablet Take 4 mg by mouth every 8 (eight) hours as needed for nausea or vomiting.   Pertuzumab (PERJETA IV) Inject into the vein.   QUEtiapine (SEROQUEL) 25 MG tablet Take 12.5 mg by mouth 3 (three) times daily.   sertraline (ZOLOFT) 100 MG tablet Take 175 mg by mouth daily.    Trastuzumab (HERCEPTIN IV) Inject into the vein.   [DISCONTINUED] divalproex (DEPAKOTE) 500 MG DR tablet Take 500 mg by mouth daily.    [DISCONTINUED] lidocaine-prilocaine (EMLA) cream Apply to affected area once   [DISCONTINUED] prochlorperazine (COMPAZINE) 10 MG tablet Take 1 tablet (  10 mg total) by mouth every 6 (six) hours as needed (Nausea or vomiting).  °• ALPRAZolam (XANAX) 0.5 MG tablet   °• LORazepam (ATIVAN) 0.5 MG tablet Take 0.5 mg by mouth every 6 (six) hours as needed for anxiety.  °• [DISCONTINUED] sertraline (ZOLOFT) 100 MG tablet Take by mouth.  °• [DISCONTINUED] sertraline (ZOLOFT) 50 MG tablet   °• [DISCONTINUED] sodium chloride flush (NS) 0.9 % injection 10 mL   ° °No facility-administered encounter medications on file as of 01/18/2019.   ° ° °ALLERGIES:  °No Known Allergies ° ° °PHYSICAL EXAM:  °ECOG Performance status: 1 ° °Vitals:  ° 01/18/19 0858  °BP: 132/68  °Pulse: 63  °Resp: 18  °Temp: 97.9 °F (36.6 °C)  °SpO2: 100%  ° °Filed Weights  ° 01/18/19 0858  °Weight: 152 lb 3.2 oz (69 kg)  ° ° °Physical Exam °Vitals signs reviewed.  °Constitutional:   °   Appearance: Normal appearance.  °Cardiovascular:  °   Rate and Rhythm: Normal rate and regular rhythm.  °   Heart sounds: Normal heart sounds.  °Pulmonary:  °   Effort: Pulmonary effort is normal.  °   Breath sounds: Normal breath sounds.  °Abdominal:  °   General: There is no distension.  °   Palpations: Abdomen is soft. There is no mass.   °Musculoskeletal:     °   General: No swelling.  °Skin: °   General: Skin is warm.  °Neurological:  °   General: No focal deficit present.  °   Mental Status: She is alert and oriented to person, place, and time.  °Psychiatric:     °   Mood and Affect: Mood normal.     °   Behavior: Behavior normal.  ° ° ° ° °LABORATORY DATA:  °I have reviewed the labs as listed.  °CBC °   °Component Value Date/Time  ° WBC 5.8 01/18/2019 0852  ° RBC 4.68 01/18/2019 0852  ° HGB 12.8 01/18/2019 0852  ° HCT 41.2 01/18/2019 0852  ° PLT 239 01/18/2019 0852  ° MCV 88.0 01/18/2019 0852  ° MCH 27.4 01/18/2019 0852  ° MCHC 31.1 01/18/2019 0852  ° RDW 14.9 01/18/2019 0852  ° LYMPHSABS 1.3 01/18/2019 0852  ° MONOABS 0.4 01/18/2019 0852  ° EOSABS 0.1 01/18/2019 0852  ° BASOSABS 0.0 01/18/2019 0852  ° °CMP Latest Ref Rng & Units 01/18/2019 12/26/2018 12/05/2018  °Glucose 70 - 99 mg/dL 82 101(H) 112(H)  °BUN 8 - 23 mg/dL 14 16 15  °Creatinine 0.44 - 1.00 mg/dL 0.56 0.55 0.52  °Sodium 135 - 145 mmol/L 140 140 139  °Potassium 3.5 - 5.1 mmol/L 4.2 4.1 3.6  °Chloride 98 - 111 mmol/L 101 104 101  °CO2 22 - 32 mmol/L 27 28 27  °Calcium 8.9 - 10.3 mg/dL 9.5 9.0 9.1  °Total Protein 6.5 - 8.1 g/dL 7.7 7.3 7.8  °Total Bilirubin 0.3 - 1.2 mg/dL 0.3 0.4 0.7  °Alkaline Phos 38 - 126 U/L 80 75 85  °AST 15 - 41 U/L 16 14(L) 16  °ALT 0 - 44 U/L 12 11 11  ° ° ° ° ° °DIAGNOSTIC IMAGING:  °I have independently reviewed the scans and discussed with the patient. ° ° °I have reviewed   LPN's note and agree with the documentation.  I personally performed a face-to-face visit, made revisions and my assessment and plan is as follows. ° ° ° °ASSESSMENT & PLAN:  ° °HER2-positive carcinoma of left breast (HCC) °1.    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 °- PET/CT scan on 05/11/2018 shows bilateral  hypermetabolic pulmonary nodules consistent with pulmonary metastasis with no metastatic disease outside of the thorax. °- On 05/23/2018, lung biopsy shows poorly differentiated metastatic carcinoma, consistent with breast primary. °- 6 cycles of THP from 06/05/2018 through 09/18/2018  °- Maintenance Herceptin and Pertuzumab from 10/19/2018 through 12/26/2018 with progression.  °- PET scan on 09/29/2018 shows marked reduction in size and metabolic activity of bilateral pulmonary nodules and breast mass and adenopathy. °-She was tolerating Herceptin and Pertuzumab very well. °-MUGA scan on 11/30/2018 shows EF of 61%. °-We reviewed results of the PET scan dated 01/01/2019.  This showed progressive disease in the left breast primary, left axillary nodal and bilateral pulmonary metastasis. °- I have recommended change in therapy.  We will discontinue the current regimen. °-Physical examination today shows slight increase in the left breast mass in the upper quadrant.  No palpable axillary adenopathy. °-I have recommended Kadcyla in the second line setting.  We talked about side effects in detail.  We talked about scheduling detail. °- We will get her insurance authorization and start her on therapy next week. ° °Total time spent is 40 minutes with more than 50% of the time spent face-to-face discussing change in treatment plan, side effects and coordination of care. °  ° °Orders placed this encounter:  °No orders of the defined types were placed in this encounter. ° ° ° ° °Sreedhar Katragadda, MD °[facility] °[phone] °  ° °

## 2019-01-18 NOTE — Progress Notes (Signed)
DISCONTINUE ON PATHWAY REGIMEN - Breast     A cycle is every 21 days:     Pertuzumab      Pertuzumab      Trastuzumab-xxxx      Trastuzumab-xxxx      Docetaxel   **Always confirm dose/schedule in your pharmacy ordering system**  REASON: Disease Progression PRIOR TREATMENT: [id number]: Docetaxel + Trastuzumab + Pertuzumab (THP) q21 Days Until Progression or Toxicity TREATMENT RESPONSE: Progressive Disease (PD)  START ON PATHWAY REGIMEN - Breast     A cycle is every 21 days:     Ado-trastuzumab emtansine   **Always confirm dose/schedule in your pharmacy ordering system**  Patient Characteristics: Distant Metastases or Locoregional Recurrent Disease - Unresected or Locally Advanced Unresectable Disease Progressing after Neoadjuvant and Local Therapies, HER2 Positive, ER Negative/Unknown, Chemotherapy, Second Line Therapeutic Status: Distant Metastases BRCA Mutation Status: Did Not Order Test ER Status: Negative (-) HER2 Status: Positive (+) PR Status: Negative (-) Line of Therapy: Second Line Intent of Therapy: Non-Curative / Palliative Intent, Discussed with Patient

## 2019-01-18 NOTE — Patient Instructions (Signed)
[facility] at [facility] Discharge Instructions  Labs drawn from portacath today   Thank you for choosing [facility] at [facility] to provide your oncology and hematology care.  To afford each patient quality time with our provider, please arrive at least 15 minutes before your scheduled appointment time.   If you have a lab appointment with the Cancer Center please come in thru the  Main Entrance and check in at the main information desk  You need to re-schedule your appointment should you arrive 10 or more minutes late.  We strive to give you quality time with our providers, and arriving late affects you and other patients whose appointments are after yours.  Also, if you no show three or more times for appointments you may be dismissed from the clinic at the providers discretion.     Again, thank you for choosing [facility].  Our hope is that these requests will decrease the amount of time that you wait before being seen by our physicians.       _____________________________________________________________  Should you have questions after your visit to  Cancer Center, please contact our office at [phone] between the hours of 8:00 a.m. and 4:30 p.m.  Voicemails left after 4:00 p.m. will not be returned until the following business day.  For prescription refill requests, have your pharmacy contact our office and allow 72 hours.    Cancer Center Support Programs:   > Cancer Support Group  2nd Tuesday of the month 1pm-2pm, Journey Room   

## 2019-01-18 NOTE — Assessment & Plan Note (Deleted)
1.  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 - PET/CT scan on 05/11/2018 shows bilateral hypermetabolic pulmonary nodules consistent with pulmonary metastasis with no metastatic disease outside of the thorax. - On 05/23/2018, lung biopsy shows poorly differentiated metastatic carcinoma, consistent with breast primary. - 6 cycles of THP from 06/05/2018 through 09/18/2018  - Maintenance Herceptin and Pertuzumab started on 10/19/2018.  - PET scan on 09/29/2018 shows marked reduction in size and metabolic activity of bilateral pulmonary nodules and breast mass and adenopathy. -Continue Herceptin and Pertuzumab. -MUGA scan on 11/30/2018 shows EF of 61%. -PET/CT: 01/01/2019: Unfortunately shows disease progression with new/increased hypermetabolism within the left breast primary, left axillary nodal, and bilateral pulmonary metastasis. Discussed findings with patient. I have recommend to discontinue current therapy and change therapy to Kadcyla. Side effects were reviewed with patient. Plan to continue treatment until disease progression or unacceptable toxicity occurs.  - RTC in 1 week.

## 2019-01-18 NOTE — Progress Notes (Signed)
ON PATHWAY REGIMEN - Breast  No Change  Continue With Treatment as Ordered.     A cycle is every 21 days:     Pertuzumab      Pertuzumab      Trastuzumab-xxxx      Trastuzumab-xxxx      Docetaxel   **Always confirm dose/schedule in your pharmacy ordering system**  Patient Characteristics: Distant Metastases or Locoregional Recurrent Disease - Unresected or Locally Advanced Unresectable Disease Progressing after Neoadjuvant and Local Therapies, HER2 Positive, ER Negative/Unknown, Chemotherapy, First Line Therapeutic Status: Distant Metastases BRCA Mutation Status: Did Not Order Test ER Status: Negative (-) HER2 Status: Positive (+) PR Status: Negative (-) Line of therapy: First Line Intent of Therapy: Non-Curative / Palliative Intent, Discussed with Patient

## 2019-01-18 NOTE — Progress Notes (Signed)
Per ATravis LPN. Dr. Delton Coombes is changing patient to Kadcyla every 3 weeks to start next week. Verified with [employer]. Message sent to scheduling. Instructed scheduling to start patient next week for Kadcyla every 3 weeks per ATravis LPN. No medication available today.   Vital signs stable. No complaints at this time. Discharged from clinic ambulatory. F/U with [facility] as scheduled. New schedule given to patient by ANance.

## 2019-01-18 NOTE — Patient Instructions (Addendum)
[facility] at [facility] Discharge Instructions  You were seen today by Dr. Katragadda. He went over your recent lab results. He will see you back in 3 weeks for labs and follow up.   Thank you for choosing [facility] at [facility] to provide your oncology and hematology care.  To afford each patient quality time with our provider, please arrive at least 15 minutes before your scheduled appointment time.   If you have a lab appointment with the Cancer Center please come in thru the  Main Entrance and check in at the main information desk  You need to re-schedule your appointment should you arrive 10 or more minutes late.  We strive to give you quality time with our providers, and arriving late affects you and other patients whose appointments are after yours.  Also, if you no show three or more times for appointments you may be dismissed from the clinic at the providers discretion.     Again, thank you for choosing [facility].  Our hope is that these requests will decrease the amount of time that you wait before being seen by our physicians.       _____________________________________________________________  Should you have questions after your visit to [facility], please contact our office at [phone] between the hours of 8:00 a.m. and 4:30 p.m.  Voicemails left after 4:00 p.m. will not be returned until the following business day.  For prescription refill requests, have your pharmacy contact our office and allow 72 hours.    Cancer Center Support Programs:   > Cancer Support Group  2nd Tuesday of the month 1pm-2pm, Journey Room    

## 2019-01-19 ENCOUNTER — Other Ambulatory Visit: Payer: Self-pay | Admitting: Hematology

## 2019-01-19 NOTE — Assessment & Plan Note (Addendum)
1.  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 - PET/CT scan on 05/11/2018 shows bilateral hypermetabolic pulmonary nodules consistent with pulmonary metastasis with no metastatic disease outside of the thorax. - On 05/23/2018, lung biopsy shows poorly differentiated metastatic carcinoma, consistent with breast primary. - 6 cycles of THP from 06/05/2018 through 09/18/2018  - Maintenance Herceptin and Pertuzumab from 10/19/2018 through 12/26/2018 with progression.  - PET scan on 09/29/2018 shows marked reduction in size and metabolic activity of bilateral pulmonary nodules and breast mass and adenopathy. -She was tolerating Herceptin and Pertuzumab very well. -MUGA scan on 11/30/2018 shows EF of 61%. -We reviewed results of the PET scan dated 01/01/2019.  This showed progressive disease in the left breast primary, left axillary nodal and bilateral pulmonary metastasis. - I have recommended change in therapy.  We will discontinue the current regimen. -Physical examination today shows slight increase in the left breast mass in the upper quadrant.  No palpable axillary adenopathy. -I have recommended Kadcyla in the second line setting.  We talked about side effects in detail.  We talked about scheduling detail. - We will get her insurance authorization and start her on therapy next week.

## 2019-01-20 DIAGNOSIS — E559 Vitamin D deficiency, unspecified: Secondary | ICD-10-CM | POA: Diagnosis not present

## 2019-01-20 DIAGNOSIS — I10 Essential (primary) hypertension: Secondary | ICD-10-CM | POA: Diagnosis not present

## 2019-01-20 DIAGNOSIS — M6281 Muscle weakness (generalized): Secondary | ICD-10-CM | POA: Diagnosis not present

## 2019-01-20 DIAGNOSIS — R52 Pain, unspecified: Secondary | ICD-10-CM | POA: Diagnosis not present

## 2019-01-22 ENCOUNTER — Encounter (HOSPITAL_COMMUNITY): Payer: Self-pay | Admitting: *Deleted

## 2019-01-22 DIAGNOSIS — F0280 Dementia in other diseases classified elsewhere without behavioral disturbance: Secondary | ICD-10-CM | POA: Diagnosis not present

## 2019-01-22 DIAGNOSIS — F419 Anxiety disorder, unspecified: Secondary | ICD-10-CM | POA: Diagnosis not present

## 2019-01-22 DIAGNOSIS — F062 Psychotic disorder with delusions due to known physiological condition: Secondary | ICD-10-CM | POA: Diagnosis not present

## 2019-01-22 NOTE — Progress Notes (Signed)
Patient's sister called the clinic today stating that she has some concerns about treatments and patients other health issues.  She feels that since starting treatments, Ms. Galik's dementia/Alzheimer's has gotten worse. She feels like she is slipping away.  She is easily frazzled and her anxiety attacks are much worse.  She verbalized the patient's wishes to continue treatments and fight, but voiced her concerns about the quality of life that it is causing.  She states that she wants Dr. Delton Coombes to be aware and take that all into consideration when he is treating her.    I relayed the information to Dr. Delton Coombes.

## 2019-01-25 ENCOUNTER — Other Ambulatory Visit: Payer: Self-pay

## 2019-01-25 ENCOUNTER — Other Ambulatory Visit (HOSPITAL_COMMUNITY): Payer: Self-pay | Admitting: Hematology

## 2019-01-25 DIAGNOSIS — R569 Unspecified convulsions: Secondary | ICD-10-CM | POA: Diagnosis not present

## 2019-01-25 DIAGNOSIS — E785 Hyperlipidemia, unspecified: Secondary | ICD-10-CM | POA: Diagnosis not present

## 2019-01-25 DIAGNOSIS — D649 Anemia, unspecified: Secondary | ICD-10-CM | POA: Diagnosis not present

## 2019-01-25 DIAGNOSIS — E119 Type 2 diabetes mellitus without complications: Secondary | ICD-10-CM | POA: Diagnosis not present

## 2019-01-25 DIAGNOSIS — E039 Hypothyroidism, unspecified: Secondary | ICD-10-CM | POA: Diagnosis not present

## 2019-01-26 ENCOUNTER — Inpatient Hospital Stay (HOSPITAL_COMMUNITY): Payer: Medicare Other | Attending: Hematology

## 2019-01-26 ENCOUNTER — Inpatient Hospital Stay (HOSPITAL_COMMUNITY): Payer: Medicare Other

## 2019-01-26 ENCOUNTER — Encounter (HOSPITAL_COMMUNITY): Payer: Self-pay

## 2019-01-26 VITALS — BP 119/45 | HR 62 | Temp 97.7°F | Resp 18 | Wt 152.6 lb

## 2019-01-26 DIAGNOSIS — G309 Alzheimer's disease, unspecified: Secondary | ICD-10-CM | POA: Insufficient documentation

## 2019-01-26 DIAGNOSIS — Z171 Estrogen receptor negative status [ER-]: Secondary | ICD-10-CM | POA: Diagnosis not present

## 2019-01-26 DIAGNOSIS — C773 Secondary and unspecified malignant neoplasm of axilla and upper limb lymph nodes: Secondary | ICD-10-CM | POA: Diagnosis not present

## 2019-01-26 DIAGNOSIS — C50912 Malignant neoplasm of unspecified site of left female breast: Secondary | ICD-10-CM

## 2019-01-26 DIAGNOSIS — Z79899 Other long term (current) drug therapy: Secondary | ICD-10-CM | POA: Insufficient documentation

## 2019-01-26 DIAGNOSIS — C7802 Secondary malignant neoplasm of left lung: Secondary | ICD-10-CM | POA: Diagnosis not present

## 2019-01-26 DIAGNOSIS — Z5112 Encounter for antineoplastic immunotherapy: Secondary | ICD-10-CM | POA: Insufficient documentation

## 2019-01-26 DIAGNOSIS — C7801 Secondary malignant neoplasm of right lung: Secondary | ICD-10-CM | POA: Diagnosis not present

## 2019-01-26 DIAGNOSIS — C50412 Malignant neoplasm of upper-outer quadrant of left female breast: Secondary | ICD-10-CM | POA: Insufficient documentation

## 2019-01-26 LAB — COMPREHENSIVE METABOLIC PANEL
ALT: 10 U/L (ref 0–44)
AST: 15 U/L (ref 15–41)
Albumin: 3.7 g/dL (ref 3.5–5.0)
Alkaline Phosphatase: 76 U/L (ref 38–126)
Anion gap: 10 (ref 5–15)
BUN: 18 mg/dL (ref 8–23)
CO2: 27 mmol/L (ref 22–32)
Calcium: 9.1 mg/dL (ref 8.9–10.3)
Chloride: 104 mmol/L (ref 98–111)
Creatinine, Ser: 0.6 mg/dL (ref 0.44–1.00)
GFR calc Af Amer: >60 mL/min (ref >60)
GFR calc non Af Amer: >60 mL/min (ref >60)
Glucose, Bld: 110 mg/dL — ABNORMAL HIGH (ref 70–99)
Potassium: 3.7 mmol/L (ref 3.5–5.1)
Sodium: 141 mmol/L (ref 135–145)
Total Bilirubin: 0.3 mg/dL (ref 0.3–1.2)
Total Protein: 7.7 g/dL (ref 6.5–8.1)

## 2019-01-26 LAB — CBC WITH DIFFERENTIAL/PLATELET
Abs Immature Granulocytes: 0.01 10*3/uL (ref 0.00–0.07)
Basophils Absolute: 0 10*3/uL (ref 0.0–0.1)
Basophils Relative: 1 %
Eosinophils Absolute: 0.1 10*3/uL (ref 0.0–0.5)
Eosinophils Relative: 2 %
HCT: 40.3 % (ref 36.0–46.0)
Hemoglobin: 12.6 g/dL (ref 12.0–15.0)
Immature Granulocytes: 0 %
Lymphocytes Relative: 22 %
Lymphs Abs: 1.4 10*3/uL (ref 0.7–4.0)
MCH: 27.5 pg (ref 26.0–34.0)
MCHC: 31.3 g/dL (ref 30.0–36.0)
MCV: 87.8 fL (ref 80.0–100.0)
Monocytes Absolute: 0.4 10*3/uL (ref 0.1–1.0)
Monocytes Relative: 5 %
Neutro Abs: 4.6 10*3/uL (ref 1.7–7.7)
Neutrophils Relative %: 70 %
Platelets: 208 10*3/uL (ref 150–400)
RBC: 4.59 MIL/uL (ref 3.87–5.11)
RDW: 15.4 % (ref 11.5–15.5)
WBC: 6.5 10*3/uL (ref 4.0–10.5)
nRBC: 0 % (ref 0.0–0.2)

## 2019-01-26 MED ORDER — SODIUM CHLORIDE 0.9 % IV SOLN
Freq: Once | INTRAVENOUS | Status: AC
  Administered 2019-01-26: 10:00:00 via INTRAVENOUS

## 2019-01-26 MED ORDER — ACETAMINOPHEN 325 MG PO TABS
650.0000 mg | ORAL_TABLET | Freq: Once | ORAL | Status: AC
  Administered 2019-01-26: 650 mg via ORAL
  Filled 2019-01-26: qty 2

## 2019-01-26 MED ORDER — DIPHENHYDRAMINE HCL 25 MG PO CAPS
50.0000 mg | ORAL_CAPSULE | Freq: Once | ORAL | Status: AC
  Administered 2019-01-26: 50 mg via ORAL
  Filled 2019-01-26: qty 2

## 2019-01-26 MED ORDER — SODIUM CHLORIDE 0.9% FLUSH
10.0000 mL | INTRAVENOUS | Status: DC | PRN
  Administered 2019-01-26: 10 mL
  Filled 2019-01-26: qty 10

## 2019-01-26 MED ORDER — HEPARIN SOD (PORK) LOCK FLUSH 100 UNIT/ML IV SOLN
500.0000 [IU] | Freq: Once | INTRAVENOUS | Status: AC | PRN
  Administered 2019-01-26: 13:00:00 500 [IU]

## 2019-01-26 MED ORDER — SODIUM CHLORIDE 0.9 % IV SOLN
260.0000 mg | Freq: Once | INTRAVENOUS | Status: AC
  Administered 2019-01-26: 260 mg via INTRAVENOUS
  Filled 2019-01-26: qty 8

## 2019-01-26 NOTE — Progress Notes (Signed)
To treatment room for Kadcyla.  No complaints voiced.  No s/s of distress noted.  Consent signed with information given for review and sent to the [facility]    Patient tolerated chemotherapy with no complaints voiced.  Port site clean and dry with no bruising or swelling noted at site.  Good blood return noted before and after administration of chemotherapy.  Band aid applied.  Patient left ambulatory with VSS and no s/s of distress noted.

## 2019-02-12 ENCOUNTER — Other Ambulatory Visit (HOSPITAL_COMMUNITY): Payer: Medicare Other

## 2019-02-12 ENCOUNTER — Ambulatory Visit (HOSPITAL_COMMUNITY): Payer: Medicare Other | Admitting: Hematology

## 2019-02-12 ENCOUNTER — Ambulatory Visit (HOSPITAL_COMMUNITY): Payer: Medicare Other

## 2019-02-15 DIAGNOSIS — R52 Pain, unspecified: Secondary | ICD-10-CM | POA: Diagnosis not present

## 2019-02-15 DIAGNOSIS — M6281 Muscle weakness (generalized): Secondary | ICD-10-CM | POA: Diagnosis not present

## 2019-02-15 DIAGNOSIS — E559 Vitamin D deficiency, unspecified: Secondary | ICD-10-CM | POA: Diagnosis not present

## 2019-02-15 DIAGNOSIS — I10 Essential (primary) hypertension: Secondary | ICD-10-CM | POA: Diagnosis not present

## 2019-02-16 ENCOUNTER — Inpatient Hospital Stay (HOSPITAL_COMMUNITY): Payer: Medicare Other

## 2019-02-16 ENCOUNTER — Encounter (HOSPITAL_COMMUNITY): Payer: Self-pay | Admitting: Nurse Practitioner

## 2019-02-16 ENCOUNTER — Other Ambulatory Visit: Payer: Self-pay

## 2019-02-16 ENCOUNTER — Inpatient Hospital Stay (HOSPITAL_BASED_OUTPATIENT_CLINIC_OR_DEPARTMENT_OTHER): Payer: Medicare Other | Admitting: Nurse Practitioner

## 2019-02-16 VITALS — BP 114/66 | HR 62 | Temp 98.6°F | Resp 16

## 2019-02-16 DIAGNOSIS — C50412 Malignant neoplasm of upper-outer quadrant of left female breast: Secondary | ICD-10-CM | POA: Diagnosis not present

## 2019-02-16 DIAGNOSIS — C7801 Secondary malignant neoplasm of right lung: Secondary | ICD-10-CM | POA: Diagnosis not present

## 2019-02-16 DIAGNOSIS — C773 Secondary and unspecified malignant neoplasm of axilla and upper limb lymph nodes: Secondary | ICD-10-CM | POA: Diagnosis not present

## 2019-02-16 DIAGNOSIS — C7802 Secondary malignant neoplasm of left lung: Secondary | ICD-10-CM

## 2019-02-16 DIAGNOSIS — C50912 Malignant neoplasm of unspecified site of left female breast: Secondary | ICD-10-CM

## 2019-02-16 DIAGNOSIS — I10 Essential (primary) hypertension: Secondary | ICD-10-CM | POA: Diagnosis not present

## 2019-02-16 DIAGNOSIS — Z171 Estrogen receptor negative status [ER-]: Secondary | ICD-10-CM | POA: Diagnosis not present

## 2019-02-16 DIAGNOSIS — G309 Alzheimer's disease, unspecified: Secondary | ICD-10-CM | POA: Diagnosis not present

## 2019-02-16 DIAGNOSIS — E876 Hypokalemia: Secondary | ICD-10-CM | POA: Diagnosis not present

## 2019-02-16 DIAGNOSIS — C50812 Malignant neoplasm of overlapping sites of left female breast: Secondary | ICD-10-CM | POA: Diagnosis not present

## 2019-02-16 DIAGNOSIS — Z5112 Encounter for antineoplastic immunotherapy: Secondary | ICD-10-CM | POA: Diagnosis not present

## 2019-02-16 DIAGNOSIS — E559 Vitamin D deficiency, unspecified: Secondary | ICD-10-CM | POA: Diagnosis not present

## 2019-02-16 LAB — COMPREHENSIVE METABOLIC PANEL
ALT: 15 U/L (ref 0–44)
AST: 22 U/L (ref 15–41)
Albumin: 3.8 g/dL (ref 3.5–5.0)
Alkaline Phosphatase: 85 U/L (ref 38–126)
Anion gap: 12 (ref 5–15)
BUN: 13 mg/dL (ref 8–23)
CO2: 28 mmol/L (ref 22–32)
Calcium: 9.3 mg/dL (ref 8.9–10.3)
Chloride: 101 mmol/L (ref 98–111)
Creatinine, Ser: 0.52 mg/dL (ref 0.44–1.00)
GFR calc Af Amer: >60 mL/min (ref >60)
GFR calc non Af Amer: >60 mL/min (ref >60)
Glucose, Bld: 85 mg/dL (ref 70–99)
Potassium: 4.1 mmol/L (ref 3.5–5.1)
Sodium: 141 mmol/L (ref 135–145)
Total Bilirubin: 0.5 mg/dL (ref 0.3–1.2)
Total Protein: 8 g/dL (ref 6.5–8.1)

## 2019-02-16 LAB — CBC WITH DIFFERENTIAL/PLATELET
Abs Immature Granulocytes: 0.02 10*3/uL (ref 0.00–0.07)
Basophils Absolute: 0 10*3/uL (ref 0.0–0.1)
Basophils Relative: 1 %
Eosinophils Absolute: 0.1 10*3/uL (ref 0.0–0.5)
Eosinophils Relative: 1 %
HCT: 40.1 % (ref 36.0–46.0)
Hemoglobin: 12.5 g/dL (ref 12.0–15.0)
Immature Granulocytes: 0 %
Lymphocytes Relative: 20 %
Lymphs Abs: 1.3 10*3/uL (ref 0.7–4.0)
MCH: 27.4 pg (ref 26.0–34.0)
MCHC: 31.2 g/dL (ref 30.0–36.0)
MCV: 87.9 fL (ref 80.0–100.0)
Monocytes Absolute: 0.5 10*3/uL (ref 0.1–1.0)
Monocytes Relative: 7 %
Neutro Abs: 4.4 10*3/uL (ref 1.7–7.7)
Neutrophils Relative %: 71 %
Platelets: 303 10*3/uL (ref 150–400)
RBC: 4.56 MIL/uL (ref 3.87–5.11)
RDW: 14.9 % (ref 11.5–15.5)
WBC: 6.3 10*3/uL (ref 4.0–10.5)
nRBC: 0 % (ref 0.0–0.2)

## 2019-02-16 MED ORDER — DIPHENHYDRAMINE HCL 25 MG PO CAPS
ORAL_CAPSULE | ORAL | Status: AC
  Filled 2019-02-16: qty 2

## 2019-02-16 MED ORDER — SODIUM CHLORIDE 0.9 % IV SOLN
3.6300 mg/kg | Freq: Once | INTRAVENOUS | Status: AC
  Administered 2019-02-16: 260 mg via INTRAVENOUS
  Filled 2019-02-16: qty 5

## 2019-02-16 MED ORDER — ACETAMINOPHEN 325 MG PO TABS
ORAL_TABLET | ORAL | Status: AC
  Filled 2019-02-16: qty 2

## 2019-02-16 MED ORDER — HEPARIN SOD (PORK) LOCK FLUSH 100 UNIT/ML IV SOLN
500.0000 [IU] | Freq: Once | INTRAVENOUS | Status: AC | PRN
  Administered 2019-02-16: 500 [IU]

## 2019-02-16 MED ORDER — DIPHENHYDRAMINE HCL 25 MG PO CAPS
50.0000 mg | ORAL_CAPSULE | Freq: Once | ORAL | Status: AC
  Administered 2019-02-16: 10:00:00 50 mg via ORAL

## 2019-02-16 MED ORDER — SODIUM CHLORIDE 0.9 % IV SOLN
Freq: Once | INTRAVENOUS | Status: AC
  Administered 2019-02-16: 10:00:00 via INTRAVENOUS

## 2019-02-16 MED ORDER — SODIUM CHLORIDE 0.9% FLUSH
10.0000 mL | INTRAVENOUS | Status: DC | PRN
  Administered 2019-02-16: 10 mL
  Filled 2019-02-16: qty 10

## 2019-02-16 MED ORDER — ACETAMINOPHEN 325 MG PO TABS
650.0000 mg | ORAL_TABLET | Freq: Once | ORAL | Status: AC
  Administered 2019-02-16: 650 mg via ORAL

## 2019-02-16 NOTE — Patient Instructions (Signed)

## 2019-02-16 NOTE — Assessment & Plan Note (Addendum)
1.  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 - PET/CT scan on 05/11/2018 shows bilateral hypermetabolic pulmonary nodules consistent with pulmonary metastasis with no metastatic disease outside of the thorax. - On 05/23/2018 she had a lung biopsy shows poorly differentiated metastatic carcinoma, consistent with breast primary. -6 cycles of THP from 06/05/2018-09/18/2018. - PET scan on 09/29/2018 showed marked reduction in size and metabolic activity of bilateral pulmonary nodules and breast mass and adenopathy. -Maintenance Herceptin and Pertuzumab from 10/19/2018-12/26/2018 with progression. -MUGA scan on 11/30/2018 showed EF 61% - PET scan on 01/01/2019 showed progressive disease in the left breast primary, left axillary nodule and bilateral pulmonary metastasis. - She was switched to Kadcyla in the second line setting.  Today is her second cycle. -Labs on 02/16/2019 showed LFTs WNL, creatinine 0.52, hemoglobin 12.5, WBC 6.3 - She will follow-up in 3 weeks with with labs for her third cycle.

## 2019-02-16 NOTE — Progress Notes (Signed)
Proceed today with treatment per Francene Finders NP.

## 2019-02-16 NOTE — Patient Instructions (Signed)
[facility] at [facility] Discharge Instructions Follow up in 3 weeks with labs and treatment.    Thank you for choosing [facility] at [facility] to provide your oncology and hematology care.  To afford each patient quality time with our provider, please arrive at least 15 minutes before your scheduled appointment time.   If you have a lab appointment with the Cancer Center please come in thru the  Main Entrance and check in at the main information desk  You need to re-schedule your appointment should you arrive 10 or more minutes late.  We strive to give you quality time with our providers, and arriving late affects you and other patients whose appointments are after yours.  Also, if you no show three or more times for appointments you may be dismissed from the clinic at the providers discretion.     Again, thank you for choosing [facility].  Our hope is that these requests will decrease the amount of time that you wait before being seen by our physicians.       _____________________________________________________________  Should you have questions after your visit to [facility], please contact our office at [phone] between the hours of 8:00 a.m. and 4:30 p.m.  Voicemails left after 4:00 p.m. will not be returned until the following business day.  For prescription refill requests, have your pharmacy contact our office and allow 72 hours.    Cancer Center Support Programs:   > Cancer Support Group  2nd Tuesday of the month 1pm-2pm, Journey Room    

## 2019-02-16 NOTE — Progress Notes (Signed)
[name] [name], [place] [address]   CLINIC:  Medical Oncology/Hematology  PCP:  Renata Caprice, DO [address] [place] [address] [phone]   REASON FOR VISIT: Follow-up for metastatic breast cancer to the lungs.  CURRENT THERAPY: Kadcyla every 3 weeks  BRIEF ONCOLOGIC HISTORY:  Oncology History  Malignant neoplasm of overlapping sites of left female breast ([place])  05/15/2018 Initial Diagnosis   Malignant neoplasm of overlapping sites of left female breast ([place])   06/05/2018 - 01/17/2019 Chemotherapy   The patient had pegfilgrastim-cbqv (UDENYCA) injection 6 mg, 6 mg, Subcutaneous, Once, 6 of 6 cycles Administration: 6 mg (06/07/2018), 6 mg (06/28/2018), 6 mg (07/19/2018), 6 mg (08/30/2018), 6 mg (09/20/2018), 6 mg (08/10/2018) trastuzumab (HERCEPTIN) 600 mg in sodium chloride 0.9 % 250 mL chemo infusion, 609 mg, Intravenous,  Once, 10 of 13 cycles Administration: 600 mg (06/05/2018), 450 mg (06/26/2018), 450 mg (07/17/2018), 450 mg (08/28/2018), 450 mg (09/18/2018), 450 mg (08/07/2018), 450 mg (10/19/2018), 450 mg (11/09/2018), 450 mg (12/05/2018), 450 mg (12/26/2018) DOCEtaxel (TAXOTERE) 140 mg in sodium chloride 0.9 % 250 mL chemo infusion, 75 mg/m2 = 140 mg, Intravenous,  Once, 6 of 6 cycles Administration: 140 mg (06/05/2018), 140 mg (06/26/2018), 140 mg (07/17/2018), 140 mg (08/28/2018), 140 mg (09/18/2018), 140 mg (08/07/2018) ondansetron (ZOFRAN) 8 mg, dexamethasone (DECADRON) 4 mg in sodium chloride 0.9 % 50 mL IVPB, , Intravenous,  Once, 6 of 6 cycles Administration:  (06/05/2018),  (06/26/2018),  (07/17/2018),  (08/28/2018),  (09/18/2018),  (08/07/2018) pertuzumab (PERJETA) 840 mg in sodium chloride 0.9 % 250 mL chemo infusion, 840 mg, Intravenous, Once, 10 of 13 cycles Administration: 840 mg (06/05/2018), 420 mg (06/26/2018), 420 mg (07/17/2018), 420 mg (08/28/2018), 420 mg (09/18/2018), 420 mg (08/07/2018), 420 mg (10/19/2018), 420 mg (11/09/2018), 420 mg  (12/05/2018), 420 mg (12/26/2018)  for chemotherapy treatment.    HER2-positive carcinoma of left breast ([place])  05/30/2018 Initial Diagnosis   HER2-positive carcinoma of left breast ([place])   01/26/2019 -  Chemotherapy   The patient had ado-trastuzumab emtansine (KADCYLA) 260 mg in sodium chloride 0.9 % 250 mL chemo infusion, 240 mg, Intravenous, Once, 1 of 5 cycles Administration: 260 mg (01/26/2019)  for chemotherapy treatment.      INTERVAL HISTORY:  [name] [name] 63 y.o. female returns for routine follow-up for metastatic breast cancer to the lungs.  She reports she has been feeling well since her last treatment cycle.  She reports no side effects at this time.  She denies any numbness or tingling.  She denies any mouth sores. Denies any nausea, vomiting, or diarrhea. Denies any new pains. Had not noticed any recent bleeding such as epistaxis, hematuria or hematochezia. Denies recent chest pain on exertion, shortness of breath on minimal exertion, pre-syncopal episodes, or palpitations. Denies any numbness or tingling in hands or feet. Denies any recent fevers, infections, or recent hospitalizations. Patient reports appetite at 100% and energy level at 75%.  She is eating well maintaining her weight at this time.    REVIEW OF SYSTEMS:  Review of Systems  Constitutional: Positive for fatigue.  All other systems reviewed and are negative.    PAST MEDICAL/SURGICAL HISTORY:  Past Medical History:  Diagnosis Date  . Alzheimer's dementia ([place])   . Bradycardia   . Breast cancer ([place])   . Dementia ([employer])   . Depression   . Hypokalemia   . Hypotension   . 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 ABDOMINAL HYSTERECTOMY    . BREAST BIOPSY Left   . PORTACATH PLACEMENT Right 05/15/2018   Procedure: INSERTION PORT-A-CATH;  Surgeon: Aviva Signs, MD;  Location: [facility];  Service: General;  Laterality: Right;      SOCIAL HISTORY:  Social History   Socioeconomic History  . Marital status: Widowed    Spouse name: Not on file  . Number of children: Not on file  . Years of education: Not on file  . Highest education level: Not on file  Occupational History  . Not on file  Social Needs  . Financial resource strain: Not on file  . Food insecurity    Worry: Not on file    Inability: Not on file  . Transportation needs    Medical: Not on file    Non-medical: Not on file  Tobacco Use  . Smoking status: Unknown If Ever Smoked  . Smokeless tobacco: Never Used  Substance and Sexual Activity  . Alcohol use: Not Currently  . Drug use: Never  . Sexual activity: Not Currently  Lifestyle  . Physical activity    Days per week: Not on file    Minutes per session: Not on file  . Stress: Not on file  Relationships  . Social Herbalist on phone: Not on file    Gets together: Not on file    Attends religious service: Not on file    Active member of club or organization: Not on file    Attends meetings of clubs or organizations: Not on file    Relationship status: Not on file  . Intimate partner violence    Fear of current or ex partner: Not on file    Emotionally abused: Not on file    Physically abused: Not on file    Forced sexual activity: Not on file  Other Topics Concern  . Not on file  Social History Narrative   Lives at [facility] in [place]:  History reviewed. No pertinent family history.  CURRENT MEDICATIONS:  Outpatient Encounter Medications as of 02/16/2019  Medication Sig  . acetaminophen (TYLENOL) 325 MG tablet Take 650 mg by mouth every 6 (six) hours as needed for mild pain.  Marland Kitchen ALPRAZolam (XANAX) 0.5 MG tablet   . busPIRone (BUSPAR) 5 MG tablet Take 5 mg by mouth 2 (two) times daily.  . divalproex (DEPAKOTE) 250 MG DR tablet   . divalproex (DEPAKOTE) 500 MG DR tablet   . docusate sodium (COLACE) 100 MG capsule Take 100 mg by mouth 2 (two)  times daily.  Marland Kitchen donepezil (ARICEPT) 10 MG tablet   . LORazepam (ATIVAN) 0.5 MG tablet Take 0.5 mg by mouth every 6 (six) hours as needed for anxiety.  . memantine (NAMENDA) 10 MG tablet Take 10 mg by mouth 2 (two) times daily.  . mirtazapine (REMERON) 7.5 MG tablet Take 7.5 mg by mouth at bedtime.  . ondansetron (ZOFRAN) 4 MG tablet Take 4 mg by mouth every 8 (eight) hours as needed for nausea or vomiting.  . Pertuzumab (PERJETA IV) Inject into the vein.  Marland Kitchen QUEtiapine (SEROQUEL) 25 MG tablet Take 12.5 mg by mouth 3 (three) times daily.  . sertraline (ZOLOFT) 100 MG tablet Take 175 mg by mouth daily.   . sertraline (ZOLOFT) 50 MG tablet   . Trastuzumab (HERCEPTIN IV) Inject into the vein.  . [DISCONTINUED] ALPRAZolam (XANAX) 0.25 MG tablet Take 0.25 mg  by mouth every 8 (eight) hours as needed for anxiety.   . [DISCONTINUED] cholecalciferol (VITAMIN D) 1000 units tablet Take 2,000 Units by mouth daily.   . [DISCONTINUED] Multiple Vitamins-Minerals (ONE-A-DAY WOMENS 50+ ADVANTAGE) TABS   . [DISCONTINUED] mupirocin ointment (BACTROBAN) 2 %   . [DISCONTINUED] prochlorperazine (COMPAZINE) 10 MG tablet Take 1 tablet (10 mg total) by mouth every 6 (six) hours as needed (Nausea or vomiting).   No facility-administered encounter medications on file as of 02/16/2019.     ALLERGIES:  No Known Allergies   PHYSICAL EXAM:  ECOG Performance status: 1  Vitals:   02/16/19 0900  BP: (!) 119/57  Pulse: 68  Resp: 16  Temp: (!) 96.9 F (36.1 C)  SpO2: 98%   Filed Weights   02/16/19 0900  Weight: 154 lb (69.9 kg)    Physical Exam Constitutional:      Appearance: Normal appearance. She is normal weight.  Cardiovascular:     Rate and Rhythm: Normal rate and regular rhythm.     Heart sounds: Normal heart sounds.  Pulmonary:     Effort: Pulmonary effort is normal.     Breath sounds: Normal breath sounds.  Abdominal:     General: Bowel sounds are normal.     Palpations: Abdomen is soft.   Musculoskeletal: Normal range of motion.  Skin:    General: Skin is warm and dry.  Neurological:     Mental Status: She is alert and oriented to person, place, and time. Mental status is at baseline.  Psychiatric:        Mood and Affect: Mood normal.        Behavior: Behavior normal.        Thought Content: Thought content normal.        Judgment: Judgment normal.      LABORATORY DATA:  I have reviewed the labs as listed.  CBC    Component Value Date/Time   WBC 6.3 02/16/2019 0856   RBC 4.56 02/16/2019 0856   HGB 12.5 02/16/2019 0856   HCT 40.1 02/16/2019 0856   PLT 303 02/16/2019 0856   MCV 87.9 02/16/2019 0856   MCH 27.4 02/16/2019 0856   MCHC 31.2 02/16/2019 0856   RDW 14.9 02/16/2019 0856   LYMPHSABS 1.3 02/16/2019 0856   MONOABS 0.5 02/16/2019 0856   EOSABS 0.1 02/16/2019 0856   BASOSABS 0.0 02/16/2019 0856   CMP Latest Ref Rng & Units 02/16/2019 01/26/2019 01/18/2019  Glucose 70 - 99 mg/dL 85 110(H) 82  BUN 8 - 23 mg/dL '13 18 14  ' Creatinine 0.44 - 1.00 mg/dL 0.52 0.60 0.56  Sodium 135 - 145 mmol/L 141 141 140  Potassium 3.5 - 5.1 mmol/L 4.1 3.7 4.2  Chloride 98 - 111 mmol/L 101 104 101  CO2 22 - 32 mmol/L '28 27 27  ' Calcium 8.9 - 10.3 mg/dL 9.3 9.1 9.5  Total Protein 6.5 - 8.1 g/dL 8.0 7.7 7.7  Total Bilirubin 0.3 - 1.2 mg/dL 0.5 0.3 0.3  Alkaline Phos 38 - 126 U/L 85 76 80  AST 15 - 41 U/L '22 15 16  ' ALT 0 - 44 U/L '15 10 12     ' I personally performed a face-to-face visit.  All questions were answered to patient's stated satisfaction. Encouraged patient to call with any new concerns or questions before his next visit to the cancer center and we can certain see him sooner, if needed.     ASSESSMENT & PLAN:   HER2-positive carcinoma of left breast (  [place]) 1.  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  - PET/CT scan on 05/11/2018 shows bilateral hypermetabolic pulmonary nodules consistent with pulmonary metastasis with no metastatic disease outside of the thorax. - On 05/23/2018 she had a lung biopsy shows poorly differentiated metastatic carcinoma, consistent with breast primary. -6 cycles of THP from 06/05/2018-09/18/2018. - PET scan on 09/29/2018 showed marked reduction in size and metabolic activity of bilateral pulmonary nodules and breast mass and adenopathy. -Maintenance Herceptin and Pertuzumab from 10/19/2018-12/26/2018 with progression. -MUGA scan on 11/30/2018 showed EF 61% - PET scan on 01/01/2019 showed progressive disease in the left breast primary, left axillary nodule and bilateral pulmonary metastasis. - She was switched to Kadcyla in the second line setting.  Today is her second cycle. -Labs on 02/16/2019 showed LFTs WNL, creatinine 0.52, hemoglobin 12.5, WBC 6.3 - She will follow-up in 3 weeks with with labs for her third cycle.      Orders placed this encounter:  No orders of the defined types were placed in this encounter.     Francene Finders, FNP-C [place] [phone]

## 2019-02-26 DIAGNOSIS — Z20828 Contact with and (suspected) exposure to other viral communicable diseases: Secondary | ICD-10-CM | POA: Diagnosis not present

## 2019-03-09 ENCOUNTER — Inpatient Hospital Stay (HOSPITAL_COMMUNITY): Payer: Medicare Other

## 2019-03-09 ENCOUNTER — Inpatient Hospital Stay (HOSPITAL_BASED_OUTPATIENT_CLINIC_OR_DEPARTMENT_OTHER): Payer: Medicare Other | Admitting: Hematology

## 2019-03-09 ENCOUNTER — Other Ambulatory Visit: Payer: Self-pay

## 2019-03-09 ENCOUNTER — Inpatient Hospital Stay (HOSPITAL_COMMUNITY): Payer: Medicare Other | Attending: Hematology

## 2019-03-09 VITALS — BP 106/47 | HR 65 | Temp 98.0°F | Resp 18 | Wt 153.6 lb

## 2019-03-09 DIAGNOSIS — C50812 Malignant neoplasm of overlapping sites of left female breast: Secondary | ICD-10-CM | POA: Insufficient documentation

## 2019-03-09 DIAGNOSIS — E876 Hypokalemia: Secondary | ICD-10-CM | POA: Insufficient documentation

## 2019-03-09 DIAGNOSIS — Z5112 Encounter for antineoplastic immunotherapy: Secondary | ICD-10-CM | POA: Diagnosis not present

## 2019-03-09 DIAGNOSIS — C7801 Secondary malignant neoplasm of right lung: Secondary | ICD-10-CM

## 2019-03-09 DIAGNOSIS — Z171 Estrogen receptor negative status [ER-]: Secondary | ICD-10-CM | POA: Diagnosis not present

## 2019-03-09 DIAGNOSIS — Z79899 Other long term (current) drug therapy: Secondary | ICD-10-CM

## 2019-03-09 DIAGNOSIS — C50912 Malignant neoplasm of unspecified site of left female breast: Secondary | ICD-10-CM

## 2019-03-09 DIAGNOSIS — C7802 Secondary malignant neoplasm of left lung: Secondary | ICD-10-CM | POA: Insufficient documentation

## 2019-03-09 DIAGNOSIS — G309 Alzheimer's disease, unspecified: Secondary | ICD-10-CM | POA: Insufficient documentation

## 2019-03-09 DIAGNOSIS — C773 Secondary and unspecified malignant neoplasm of axilla and upper limb lymph nodes: Secondary | ICD-10-CM | POA: Diagnosis not present

## 2019-03-09 LAB — COMPREHENSIVE METABOLIC PANEL
ALT: 14 U/L (ref 0–44)
AST: 21 U/L (ref 15–41)
Albumin: 3.7 g/dL (ref 3.5–5.0)
Alkaline Phosphatase: 76 U/L (ref 38–126)
Anion gap: 11 (ref 5–15)
BUN: 12 mg/dL (ref 8–23)
CO2: 26 mmol/L (ref 22–32)
Calcium: 9.2 mg/dL (ref 8.9–10.3)
Chloride: 101 mmol/L (ref 98–111)
Creatinine, Ser: 0.57 mg/dL (ref 0.44–1.00)
GFR calc Af Amer: >60 mL/min (ref >60)
GFR calc non Af Amer: >60 mL/min (ref >60)
Glucose, Bld: 137 mg/dL — ABNORMAL HIGH (ref 70–99)
Potassium: 3.7 mmol/L (ref 3.5–5.1)
Sodium: 138 mmol/L (ref 135–145)
Total Bilirubin: 0.5 mg/dL (ref 0.3–1.2)
Total Protein: 7.8 g/dL (ref 6.5–8.1)

## 2019-03-09 LAB — CBC WITH DIFFERENTIAL/PLATELET
Abs Immature Granulocytes: 0.02 10*3/uL (ref 0.00–0.07)
Basophils Absolute: 0 10*3/uL (ref 0.0–0.1)
Basophils Relative: 0 %
Eosinophils Absolute: 0.1 10*3/uL (ref 0.0–0.5)
Eosinophils Relative: 1 %
HCT: 40.7 % (ref 36.0–46.0)
Hemoglobin: 12.3 g/dL (ref 12.0–15.0)
Immature Granulocytes: 0 %
Lymphocytes Relative: 18 %
Lymphs Abs: 1.4 10*3/uL (ref 0.7–4.0)
MCH: 26.6 pg (ref 26.0–34.0)
MCHC: 30.2 g/dL (ref 30.0–36.0)
MCV: 87.9 fL (ref 80.0–100.0)
Monocytes Absolute: 0.5 10*3/uL (ref 0.1–1.0)
Monocytes Relative: 7 %
Neutro Abs: 5.9 10*3/uL (ref 1.7–7.7)
Neutrophils Relative %: 74 %
Platelets: 312 10*3/uL (ref 150–400)
RBC: 4.63 MIL/uL (ref 3.87–5.11)
RDW: 14.5 % (ref 11.5–15.5)
WBC: 8 10*3/uL (ref 4.0–10.5)
nRBC: 0 % (ref 0.0–0.2)

## 2019-03-09 MED ORDER — ACETAMINOPHEN 325 MG PO TABS
650.0000 mg | ORAL_TABLET | Freq: Once | ORAL | Status: AC
  Administered 2019-03-09: 650 mg via ORAL

## 2019-03-09 MED ORDER — SODIUM CHLORIDE 0.9 % IV SOLN
3.6300 mg/kg | Freq: Once | INTRAVENOUS | Status: AC
  Administered 2019-03-09: 260 mg via INTRAVENOUS
  Filled 2019-03-09: qty 5

## 2019-03-09 MED ORDER — DIPHENHYDRAMINE HCL 25 MG PO CAPS
50.0000 mg | ORAL_CAPSULE | Freq: Once | ORAL | Status: AC
  Administered 2019-03-09: 10:00:00 50 mg via ORAL

## 2019-03-09 MED ORDER — SODIUM CHLORIDE 0.9 % IV SOLN
Freq: Once | INTRAVENOUS | Status: AC
  Administered 2019-03-09: 10:00:00 via INTRAVENOUS

## 2019-03-09 MED ORDER — DIPHENHYDRAMINE HCL 25 MG PO CAPS
ORAL_CAPSULE | ORAL | Status: AC
  Filled 2019-03-09: qty 2

## 2019-03-09 MED ORDER — SODIUM CHLORIDE 0.9% FLUSH
10.0000 mL | INTRAVENOUS | Status: DC | PRN
  Administered 2019-03-09: 10 mL
  Filled 2019-03-09: qty 10

## 2019-03-09 MED ORDER — ACETAMINOPHEN 325 MG PO TABS
ORAL_TABLET | ORAL | Status: AC
  Filled 2019-03-09: qty 2

## 2019-03-09 MED ORDER — HEPARIN SOD (PORK) LOCK FLUSH 100 UNIT/ML IV SOLN
500.0000 [IU] | Freq: Once | INTRAVENOUS | Status: AC | PRN
  Administered 2019-03-09: 500 [IU]

## 2019-03-09 NOTE — Assessment & Plan Note (Signed)
1.  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 - PET/CT scan on 05/11/2018 shows bilateral hypermetabolic pulmonary nodules consistent with pulmonary metastasis with no metastatic disease outside of the thorax. - On 05/23/2018 she had a lung biopsy shows poorly differentiated metastatic carcinoma, consistent with breast primary. -6 cycles of THP from 06/05/2018-09/18/2018. - PET scan on 09/29/2018 showed marked reduction in size and metabolic activity of bilateral pulmonary nodules and breast mass and adenopathy. -Maintenance Herceptin and Pertuzumab from 10/19/2018-12/26/2018 with progression. -MUGA scan on 11/30/2018 showed EF 61% - PET scan on 01/01/2019 showed progressive disease in the left breast primary, left axillary nodule and bilateral pulmonary metastasis. - She was switched to Kadcyla in the second line setting.  - Labs or acceptable.  She will proceed with cycle 3 today.  She is tolerating exceptionally well. -Return to clinic in 3 weeks.

## 2019-03-09 NOTE — Progress Notes (Signed)
Pt presents today for f/u visit with RNester NP and treatment. Labs pending. VS within parameters for treatment. MAR reviewed and updated. Pt has no complaints of any pain today or changes since the last visit.    Treatment given today per MD orders. Tolerated infusion without adverse affects. Vital signs stable. No complaints at this time. Discharged from clinic ambulatory with assist.  F/U with [facility] as scheduled.

## 2019-03-09 NOTE — Progress Notes (Signed)
[place], [place] [address]   CLINIC:  Medical Oncology/Hematology  PCP:  Renata Caprice, DO [address] [place] [address] [phone]   REASON FOR VISIT:  Follow-up for Metastatic Breast Cancer   CURRENT THERAPY: Kadcyla   BRIEF ONCOLOGIC HISTORY:  Oncology History  Malignant neoplasm of overlapping sites of left female breast ([place])  05/15/2018 Initial Diagnosis   Malignant neoplasm of overlapping sites of left female breast ([place])   06/05/2018 - 01/17/2019 Chemotherapy   The patient had pegfilgrastim-cbqv (UDENYCA) injection 6 mg, 6 mg, Subcutaneous, Once, 6 of 6 cycles Administration: 6 mg (06/07/2018), 6 mg (06/28/2018), 6 mg (07/19/2018), 6 mg (08/30/2018), 6 mg (09/20/2018), 6 mg (08/10/2018) trastuzumab (HERCEPTIN) 600 mg in sodium chloride 0.9 % 250 mL chemo infusion, 609 mg, Intravenous,  Once, 10 of 13 cycles Administration: 600 mg (06/05/2018), 450 mg (06/26/2018), 450 mg (07/17/2018), 450 mg (08/28/2018), 450 mg (09/18/2018), 450 mg (08/07/2018), 450 mg (10/19/2018), 450 mg (11/09/2018), 450 mg (12/05/2018), 450 mg (12/26/2018) DOCEtaxel (TAXOTERE) 140 mg in sodium chloride 0.9 % 250 mL chemo infusion, 75 mg/m2 = 140 mg, Intravenous,  Once, 6 of 6 cycles Administration: 140 mg (06/05/2018), 140 mg (06/26/2018), 140 mg (07/17/2018), 140 mg (08/28/2018), 140 mg (09/18/2018), 140 mg (08/07/2018) ondansetron (ZOFRAN) 8 mg, dexamethasone (DECADRON) 4 mg in sodium chloride 0.9 % 50 mL IVPB, , Intravenous,  Once, 6 of 6 cycles Administration:  (06/05/2018),  (06/26/2018),  (07/17/2018),  (08/28/2018),  (09/18/2018),  (08/07/2018) pertuzumab (PERJETA) 840 mg in sodium chloride 0.9 % 250 mL chemo infusion, 840 mg, Intravenous, Once, 10 of 13 cycles Administration: 840 mg (06/05/2018), 420 mg (06/26/2018), 420 mg (07/17/2018), 420 mg (08/28/2018), 420 mg (09/18/2018), 420 mg (08/07/2018), 420 mg (10/19/2018), 420 mg (11/09/2018), 420 mg (12/05/2018), 420 mg (12/26/2018)   for chemotherapy treatment.    HER2-positive carcinoma of left breast ([place])  05/30/2018 Initial Diagnosis   HER2-positive carcinoma of left breast ([place])   01/26/2019 -  Chemotherapy   The patient had ado-trastuzumab emtansine (KADCYLA) 260 mg in sodium chloride 0.9 % 250 mL chemo infusion, 240 mg, Intravenous, Once, 3 of 5 cycles Administration: 260 mg (01/26/2019), 260 mg (02/16/2019)  for chemotherapy treatment.        INTERVAL HISTORY:  [name] [name] 63 y.o. female presents today for follow-up.  She reports overall doing well.  She denies any significant fatigue.  She denies any recent infections.  She denies any fevers, chills, night sweats.  Her appetite is adequate.  She denies any chest pain or shortness of breath.  She is currently on Kadcyla tolerating well.  She states she is ready to proceed with treatment today.    REVIEW OF SYSTEMS:  Review of Systems  All other systems reviewed and are negative.    PAST MEDICAL/SURGICAL HISTORY:  Past Medical History:  Diagnosis Date  . Alzheimer's dementia ([place])   . Bradycardia   . Breast cancer ([place])   . Dementia ([place])   . Depression   . Hypokalemia   . Hypotension   . 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 ABDOMINAL HYSTERECTOMY    . BREAST BIOPSY Left   . PORTACATH PLACEMENT Right 05/15/2018   Procedure: INSERTION PORT-A-CATH;  Surgeon: Aviva Signs, MD;  Location: [facility];  Service: General;  Laterality: Right;     SOCIAL HISTORY:  Social History   Socioeconomic History  . Marital  status: Widowed    Spouse name: Not on file  . Number of children: Not on file  . Years of education: Not on file  . Highest education level: Not on file  Occupational History  . Not on file  Social Needs  . Financial resource strain: Not on file  . Food insecurity    Worry: Not on file    Inability: Not on file  . Transportation needs    Medical:  Not on file    Non-medical: Not on file  Tobacco Use  . Smoking status: Unknown If Ever Smoked  . Smokeless tobacco: Never Used  Substance and Sexual Activity  . Alcohol use: Not Currently  . Drug use: Never  . Sexual activity: Not Currently  Lifestyle  . Physical activity    Days per week: Not on file    Minutes per session: Not on file  . Stress: Not on file  Relationships  . Social Herbalist on phone: Not on file    Gets together: Not on file    Attends religious service: Not on file    Active member of club or organization: Not on file    Attends meetings of clubs or organizations: Not on file    Relationship status: Not on file  . Intimate partner violence    Fear of current or ex partner: Not on file    Emotionally abused: Not on file    Physically abused: Not on file    Forced sexual activity: Not on file  Other Topics Concern  . Not on file  Social History Narrative   L[facility] at [facility] in [place]:  No family history on file.  CURRENT MEDICATIONS:  Outpatient Encounter Medications as of 03/09/2019  Medication Sig  . acetaminophen (TYLENOL) 325 MG tablet Take 650 mg by mouth every 6 (six) hours as needed for mild pain.  Marland Kitchen ALPRAZolam (XANAX) 0.5 MG tablet   . busPIRone (BUSPAR) 5 MG tablet Take 5 mg by mouth 2 (two) times daily.  . divalproex (DEPAKOTE) 250 MG DR tablet   . divalproex (DEPAKOTE) 500 MG DR tablet   . docusate sodium (COLACE) 100 MG capsule Take 100 mg by mouth 2 (two) times daily.  Marland Kitchen donepezil (ARICEPT) 10 MG tablet   . LORazepam (ATIVAN) 0.5 MG tablet Take 0.5 mg by mouth every 6 (six) hours as needed for anxiety.  . memantine (NAMENDA) 10 MG tablet Take 10 mg by mouth 2 (two) times daily.  . mirtazapine (REMERON) 7.5 MG tablet Take 7.5 mg by mouth at bedtime.  . ondansetron (ZOFRAN) 4 MG tablet Take 4 mg by mouth every 8 (eight) hours as needed for nausea or vomiting.  . Pertuzumab (PERJETA IV) Inject into  the vein.  Marland Kitchen QUEtiapine (SEROQUEL) 25 MG tablet Take 12.5 mg by mouth 3 (three) times daily.  . sertraline (ZOLOFT) 100 MG tablet Take 175 mg by mouth daily.   . sertraline (ZOLOFT) 50 MG tablet   . Trastuzumab (HERCEPTIN IV) Inject into the vein.  . [DISCONTINUED] prochlorperazine (COMPAZINE) 10 MG tablet Take 1 tablet (10 mg total) by mouth every 6 (six) hours as needed (Nausea or vomiting).   No facility-administered encounter medications on file as of 03/09/2019.     ALLERGIES:  No Known Allergies   PHYSICAL EXAM:  ECOG Performance status: 2  There were no vitals filed for this visit. There were no vitals filed for this visit.  Physical  Exam Constitutional:      Appearance: Normal appearance. She is obese.  HENT:     Head: Normocephalic.     Nose: Nose normal.     Mouth/Throat:     Mouth: Mucous membranes are moist.     Pharynx: Oropharynx is clear.  Eyes:     Extraocular Movements: Extraocular movements intact.     Conjunctiva/sclera: Conjunctivae normal.  Neck:     Musculoskeletal: Normal range of motion.  Cardiovascular:     Rate and Rhythm: Normal rate and regular rhythm.     Pulses: Normal pulses.     Heart sounds: Normal heart sounds.  Pulmonary:     Effort: Pulmonary effort is normal.     Breath sounds: Normal breath sounds.  Abdominal:     General: Bowel sounds are normal.     Palpations: Abdomen is soft.  Musculoskeletal: Normal range of motion.  Skin:    General: Skin is warm and dry.  Neurological:     General: No focal deficit present.     Mental Status: She is alert and oriented to person, place, and time.  Psychiatric:        Mood and Affect: Mood normal.        Behavior: Behavior normal.        Thought Content: Thought content normal.        Judgment: Judgment normal.      LABORATORY DATA:  I have reviewed the labs as listed.  CBC    Component Value Date/Time   WBC 8.0 03/09/2019 0834   RBC 4.63 03/09/2019 0834   HGB 12.3 03/09/2019  0834   HCT 40.7 03/09/2019 0834   PLT 312 03/09/2019 0834   MCV 87.9 03/09/2019 0834   MCH 26.6 03/09/2019 0834   MCHC 30.2 03/09/2019 0834   RDW 14.5 03/09/2019 0834   LYMPHSABS 1.4 03/09/2019 0834   MONOABS 0.5 03/09/2019 0834   EOSABS 0.1 03/09/2019 0834   BASOSABS 0.0 03/09/2019 0834   CMP Latest Ref Rng & Units 03/09/2019 02/16/2019 01/26/2019  Glucose 70 - 99 mg/dL 137(H) 85 110(H)  BUN 8 - 23 mg/dL '12 13 18  ' Creatinine 0.44 - 1.00 mg/dL 0.57 0.52 0.60  Sodium 135 - 145 mmol/L 138 141 141  Potassium 3.5 - 5.1 mmol/L 3.7 4.1 3.7  Chloride 98 - 111 mmol/L 101 101 104  CO2 22 - 32 mmol/L '26 28 27  ' Calcium 8.9 - 10.3 mg/dL 9.2 9.3 9.1  Total Protein 6.5 - 8.1 g/dL 7.8 8.0 7.7  Total Bilirubin 0.3 - 1.2 mg/dL 0.5 0.5 0.3  Alkaline Phos 38 - 126 U/L 76 85 76  AST 15 - 41 U/L '21 22 15  ' ALT 0 - 44 U/L '14 15 10        ' ASSESSMENT & PLAN:   Malignant neoplasm of overlapping sites of left female breast ([place]) 1.  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 - PET/CT scan on 05/11/2018 shows bilateral hypermetabolic pulmonary nodules consistent with pulmonary metastasis with no metastatic disease outside of the thorax. - On 05/23/2018 she had a lung biopsy shows poorly differentiated metastatic carcinoma, consistent with breast primary. -6 cycles of THP from 06/05/2018-09/18/2018. - PET scan on 09/29/2018 showed marked reduction in size and metabolic activity of bilateral pulmonary nodules and breast mass and adenopathy. -Maintenance Herceptin and Pertuzumab from  10/19/2018-12/26/2018 with progression. -MUGA scan on 11/30/2018 showed EF 61% - PET scan on 01/01/2019 showed progressive disease in the left breast primary, left axillary nodule and bilateral pulmonary metastasis. - She was switched to Kadcyla in the second line setting.  -  Labs or acceptable.  She will proceed with cycle 3 today.  She is tolerating exceptionally well. -Return to clinic in 3 weeks.      Orders placed this encounter:  Orders Placed This Encounter  Procedures  . CBC with Differential  . Comprehensive metabolic panel      Roger Shelter, [place] [phone]

## 2019-03-09 NOTE — Progress Notes (Signed)
Labs drawn. Port accessed.

## 2019-03-09 NOTE — Patient Instructions (Signed)

## 2019-03-12 ENCOUNTER — Encounter (HOSPITAL_COMMUNITY): Payer: Self-pay | Admitting: *Deleted

## 2019-03-12 NOTE — Progress Notes (Signed)
Patient's sister called today.  She is patient's POA. She lives in [place] and received a concerning call from her sister.  I gave her an update on Ogechi's care and surgery was not talked about during the last visit.  It is known that Marliyah has dementia and patient gave a story to her sister about pending surgery.  I assured her that it was not discussed.  She voices concerns about patient getting mixed up when she hears things.  She is requesting that someone come with her to the visits so that she can have an extra set of ears as to what is actually being said.  I told her that we can allow patient's a visitor in these situations.  Patient will be bringing her aide from the nursing home, [facility].  I helped her sister get signed up for mychart so that she can follow [facility] [place]'s appointments and infusion treatments.  She was very Patent attorney.  She verbalizes understanding of the plan moving forward and knows to call with any questions or concerns.

## 2019-03-16 DIAGNOSIS — C50812 Malignant neoplasm of overlapping sites of left female breast: Secondary | ICD-10-CM | POA: Diagnosis not present

## 2019-03-16 DIAGNOSIS — G309 Alzheimer's disease, unspecified: Secondary | ICD-10-CM | POA: Diagnosis not present

## 2019-03-16 DIAGNOSIS — E559 Vitamin D deficiency, unspecified: Secondary | ICD-10-CM | POA: Diagnosis not present

## 2019-03-16 DIAGNOSIS — I10 Essential (primary) hypertension: Secondary | ICD-10-CM | POA: Diagnosis not present

## 2019-03-20 ENCOUNTER — Inpatient Hospital Stay (HOSPITAL_BASED_OUTPATIENT_CLINIC_OR_DEPARTMENT_OTHER): Payer: Medicare Other | Admitting: Hematology

## 2019-03-20 ENCOUNTER — Encounter (HOSPITAL_COMMUNITY): Payer: Self-pay | Admitting: Hematology

## 2019-03-20 ENCOUNTER — Other Ambulatory Visit: Payer: Self-pay

## 2019-03-20 ENCOUNTER — Encounter (HOSPITAL_COMMUNITY): Payer: Self-pay | Admitting: *Deleted

## 2019-03-20 DIAGNOSIS — C7801 Secondary malignant neoplasm of right lung: Secondary | ICD-10-CM

## 2019-03-20 DIAGNOSIS — Z5112 Encounter for antineoplastic immunotherapy: Secondary | ICD-10-CM | POA: Diagnosis not present

## 2019-03-20 DIAGNOSIS — E876 Hypokalemia: Secondary | ICD-10-CM

## 2019-03-20 DIAGNOSIS — C50812 Malignant neoplasm of overlapping sites of left female breast: Secondary | ICD-10-CM | POA: Diagnosis not present

## 2019-03-20 DIAGNOSIS — G309 Alzheimer's disease, unspecified: Secondary | ICD-10-CM

## 2019-03-20 DIAGNOSIS — C7802 Secondary malignant neoplasm of left lung: Secondary | ICD-10-CM

## 2019-03-20 NOTE — Progress Notes (Signed)
I received a call from the nursing home and patient's left breast is red swollen warm to the touch and hard.  They had her evaluated by the NH NP and she wanted patient to be seen by one of our providers.  Patient is scheduled today for visit with our nurse practitioner.

## 2019-03-20 NOTE — Progress Notes (Signed)
[place], [place] [address]   CLINIC:  Medical Oncology/Hematology  PCP:  Renata Caprice, DO [address] [place]  [address] [phone]   REASON FOR VISIT:  Follow-up for Breast Cancer   CURRENT THERAPY: Kadcyla  BRIEF ONCOLOGIC HISTORY:  Oncology History  Malignant neoplasm of overlapping sites of left female breast ([place])  05/15/2018 Initial Diagnosis   Malignant neoplasm of overlapping sites of left female breast ([place])   06/05/2018 - 01/17/2019 Chemotherapy   The patient had pegfilgrastim-cbqv (UDENYCA) injection 6 mg, 6 mg, Subcutaneous, Once, 6 of 6 cycles Administration: 6 mg (06/07/2018), 6 mg (06/28/2018), 6 mg (07/19/2018), 6 mg (08/30/2018), 6 mg (09/20/2018), 6 mg (08/10/2018) trastuzumab (HERCEPTIN) 600 mg in sodium chloride 0.9 % 250 mL chemo infusion, 609 mg, Intravenous,  Once, 10 of 13 cycles Administration: 600 mg (06/05/2018), 450 mg (06/26/2018), 450 mg (07/17/2018), 450 mg (08/28/2018), 450 mg (09/18/2018), 450 mg (08/07/2018), 450 mg (10/19/2018), 450 mg (11/09/2018), 450 mg (12/05/2018), 450 mg (12/26/2018) DOCEtaxel (TAXOTERE) 140 mg in sodium chloride 0.9 % 250 mL chemo infusion, 75 mg/m2 = 140 mg, Intravenous,  Once, 6 of 6 cycles Administration: 140 mg (06/05/2018), 140 mg (06/26/2018), 140 mg (07/17/2018), 140 mg (08/28/2018), 140 mg (09/18/2018), 140 mg (08/07/2018) ondansetron (ZOFRAN) 8 mg, dexamethasone (DECADRON) 4 mg in sodium chloride 0.9 % 50 mL IVPB, , Intravenous,  Once, 6 of 6 cycles Administration:  (06/05/2018),  (06/26/2018),  (07/17/2018),  (08/28/2018),  (09/18/2018),  (08/07/2018) pertuzumab (PERJETA) 840 mg in sodium chloride 0.9 % 250 mL chemo infusion, 840 mg, Intravenous, Once, 10 of 13 cycles Administration: 840 mg (06/05/2018), 420 mg (06/26/2018), 420 mg (07/17/2018), 420 mg (08/28/2018), 420 mg (09/18/2018), 420 mg (08/07/2018), 420 mg (10/19/2018), 420 mg (11/09/2018), 420 mg (12/05/2018), 420 mg (12/26/2018)  for  chemotherapy treatment.    HER2-positive carcinoma of left breast ([place])  05/30/2018 Initial Diagnosis   HER2-positive carcinoma of left breast ([place])   01/26/2019 -  Chemotherapy   The patient had ado-trastuzumab emtansine (KADCYLA) 260 mg in sodium chloride 0.9 % 250 mL chemo infusion, 240 mg, Intravenous, Once, 3 of 5 cycles Administration: 260 mg (01/26/2019), 260 mg (02/16/2019), 260 mg (03/09/2019)  for chemotherapy treatment.         INTERVAL HISTORY:  [name] [name] 63 y.o. female presents today for sick visit.  Patient was evaluated by nurse practitioner at the facility where she lives.  Her left breast was noted to be swollen red and warm to the touch.  Patient states that her symptoms began approximately 2 to 3 days ago.  She was prescribed a steroid cream and states he has not improved.  She states there is mild associated pain.  She is currently on treatment for breast cancer with Kadcyla, she is tolerating well.  She denies any fevers, chills, night sweats.  She denies any obvious signs of infections.  No open wounds.  REVIEW OF SYSTEMS:  Review of Systems  Constitutional: Positive for fatigue.  HENT:  Negative.   Eyes: Negative.   Respiratory: Negative.   Cardiovascular: Negative.   Gastrointestinal: Negative.   Endocrine: Negative.   Genitourinary: Negative.    Musculoskeletal: Positive for arthralgias.  Skin: Negative.   Neurological: Negative.   Hematological: Negative.   Psychiatric/Behavioral: Negative.      PAST MEDICAL/SURGICAL HISTORY:  Past Medical History:  Diagnosis Date  . Alzheimer's dementia ([place])   . Bradycardia   . Breast cancer ([place])   . Dementia ([place])   .  Depression   . Hypokalemia   . Hypotension   . 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 ABDOMINAL HYSTERECTOMY    . BREAST BIOPSY Left   . PORTACATH PLACEMENT Right 05/15/2018   Procedure: INSERTION  PORT-A-CATH;  Surgeon: Aviva Signs, MD;  Location: [facility];  Service: General;  Laterality: Right;     SOCIAL HISTORY:  Social History   Socioeconomic History  . Marital status: Widowed    Spouse name: Not on file  . Number of children: Not on file  . Years of education: Not on file  . Highest education level: Not on file  Occupational History  . Not on file  Social Needs  . Financial resource strain: Not on file  . Food insecurity    Worry: Not on file    Inability: Not on file  . Transportation needs    Medical: Not on file    Non-medical: Not on file  Tobacco Use  . Smoking status: Unknown If Ever Smoked  . Smokeless tobacco: Never Used  Substance and Sexual Activity  . Alcohol use: Not Currently  . Drug use: Never  . Sexual activity: Not Currently  Lifestyle  . Physical activity    Days per week: Not on file    Minutes per session: Not on file  . Stress: Not on file  Relationships  . Social Herbalist on phone: Not on file    Gets together: Not on file    Attends religious service: Not on file    Active member of club or organization: Not on file    Attends meetings of clubs or organizations: Not on file    Relationship status: Not on file  . Intimate partner violence    Fear of current or ex partner: Not on file    Emotionally abused: Not on file    Physically abused: Not on file    Forced sexual activity: Not on file  Other Topics Concern  . Not on file  Social History Narrative   Lives at [facility] in [place]:  History reviewed. No pertinent family history.  CURRENT MEDICATIONS:  Outpatient Encounter Medications as of 03/20/2019  Medication Sig  . acetaminophen (TYLENOL) 325 MG tablet Take 650 mg by mouth every 6 (six) hours as needed for mild pain.  Marland Kitchen ALPRAZolam (XANAX) 0.5 MG tablet   . betamethasone dipropionate (DIPROLENE) 0.05 % cream   . busPIRone (BUSPAR) 5 MG tablet Take 5 mg by mouth 2 (two) times daily.   . divalproex (DEPAKOTE) 250 MG DR tablet   . divalproex (DEPAKOTE) 500 MG DR tablet   . docusate sodium (COLACE) 100 MG capsule Take 100 mg by mouth 2 (two) times daily.  Marland Kitchen donepezil (ARICEPT) 10 MG tablet   . HYDROcodone-acetaminophen (NORCO/VICODIN) 5-325 MG tablet   . LORazepam (ATIVAN) 0.5 MG tablet Take 0.5 mg by mouth every 6 (six) hours as needed for anxiety.  . memantine (NAMENDA) 10 MG tablet Take 10 mg by mouth 2 (two) times daily.  . mirtazapine (REMERON) 7.5 MG tablet Take 7.5 mg by mouth at bedtime.  . ondansetron (ZOFRAN) 4 MG tablet Take 4 mg by mouth every 8 (eight) hours as needed for nausea or vomiting.  . Pertuzumab (PERJETA IV) Inject into the vein.  Marland Kitchen QUEtiapine (SEROQUEL) 25 MG tablet Take 12.5 mg by mouth 3 (three) times daily.  Marland Kitchen  sertraline (ZOLOFT) 100 MG tablet Take 175 mg by mouth daily.   . sertraline (ZOLOFT) 50 MG tablet   . Trastuzumab (HERCEPTIN IV) Inject into the vein.  . [DISCONTINUED] prochlorperazine (COMPAZINE) 10 MG tablet Take 1 tablet (10 mg total) by mouth every 6 (six) hours as needed (Nausea or vomiting).   No facility-administered encounter medications on file as of 03/20/2019.     ALLERGIES:  No Known Allergies   PHYSICAL EXAM:  ECOG Performance status: 2  Vitals:   03/20/19 1500  BP: (!) 139/58  Pulse: 76  Resp: 16  Temp: 97.7 F (36.5 C)  SpO2: 99%   Filed Weights   03/20/19 1500  Weight: 156 lb 9 oz (71 kg)    Physical Exam Constitutional:      Appearance: Normal appearance. She is obese.  HENT:     Head: Normocephalic.     Nose: Nose normal.     Mouth/Throat:     Mouth: Mucous membranes are moist.     Pharynx: Oropharynx is clear.  Eyes:     Extraocular Movements: Extraocular movements intact.     Conjunctiva/sclera: Conjunctivae normal.  Cardiovascular:     Rate and Rhythm: Normal rate and regular rhythm.     Pulses: Normal pulses.     Heart sounds: Normal heart sounds.  Pulmonary:     Effort: Pulmonary  effort is normal.     Breath sounds: Normal breath sounds.  Abdominal:     General: Bowel sounds are normal.     Palpations: Abdomen is soft.  Musculoskeletal: Normal range of motion.  Skin:    General: Skin is warm.     Findings: Erythema present.     Comments: Left breast is red and swollen.  Neurological:     General: No focal deficit present.     Mental Status: She is alert and oriented to person, place, and time.  Psychiatric:        Mood and Affect: Mood normal.        Behavior: Behavior normal.        Thought Content: Thought content normal.        Judgment: Judgment normal.      LABORATORY DATA:  I have reviewed the labs as listed.  CBC    Component Value Date/Time   WBC 8.0 03/09/2019 0834   RBC 4.63 03/09/2019 0834   HGB 12.3 03/09/2019 0834   HCT 40.7 03/09/2019 0834   PLT 312 03/09/2019 0834   MCV 87.9 03/09/2019 0834   MCH 26.6 03/09/2019 0834   MCHC 30.2 03/09/2019 0834   RDW 14.5 03/09/2019 0834   LYMPHSABS 1.4 03/09/2019 0834   MONOABS 0.5 03/09/2019 0834   EOSABS 0.1 03/09/2019 0834   BASOSABS 0.0 03/09/2019 0834   CMP Latest Ref Rng & Units 03/09/2019 02/16/2019 01/26/2019  Glucose 70 - 99 mg/dL 137(H) 85 110(H)  BUN 8 - 23 mg/dL '12 13 18  ' Creatinine 0.44 - 1.00 mg/dL 0.57 0.52 0.60  Sodium 135 - 145 mmol/L 138 141 141  Potassium 3.5 - 5.1 mmol/L 3.7 4.1 3.7  Chloride 98 - 111 mmol/L 101 101 104  CO2 22 - 32 mmol/L '26 28 27  ' Calcium 8.9 - 10.3 mg/dL 9.2 9.3 9.1  Total Protein 6.5 - 8.1 g/dL 7.8 8.0 7.7  Total Bilirubin 0.3 - 1.2 mg/dL 0.5 0.5 0.3  Alkaline Phos 38 - 126 U/L 76 85 76  AST 15 - 41 U/L '21 22 15  ' ALT 0 - 44  U/L '14 15 10        ' ASSESSMENT & PLAN:   Malignant neoplasm of overlapping sites of left female breast ([place]) 1.  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 -  PET/CT scan on 05/11/2018 shows bilateral hypermetabolic pulmonary nodules consistent with pulmonary metastasis with no metastatic disease outside of the thorax. - On 05/23/2018 she had a lung biopsy shows poorly differentiated metastatic carcinoma, consistent with breast primary. -6 cycles of THP from 06/05/2018-09/18/2018. - PET scan on 09/29/2018 showed marked reduction in size and metabolic activity of bilateral pulmonary nodules and breast mass and adenopathy. -Maintenance Herceptin and Pertuzumab from 10/19/2018-12/26/2018 with progression. -MUGA scan on 11/30/2018 showed EF 61% - PET scan on 01/01/2019 showed progressive disease in the left breast primary, left axillary nodule and bilateral pulmonary metastasis. - She was switched to Kadcyla in the second line setting.  - She has completed 3 cycles of Kadcyla.  She is tolerating well. - She presents today with reports of redness to her left breast.  On exam left breast is erythematous, swollen, and warm to the touch.  This appears to be consistent with cellulitis.  Have recommended patient stop steroid cream and start Keflex 500 mg twice daily x7 days. - She will return to clinic in 1 week.      Orders placed this encounter:  No orders of the defined types were placed in this encounter.    Roger Shelter, [place] [phone]

## 2019-03-21 NOTE — Assessment & Plan Note (Signed)
1.  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 - PET/CT scan on 05/11/2018 shows bilateral hypermetabolic pulmonary nodules consistent with pulmonary metastasis with no metastatic disease outside of the thorax. - On 05/23/2018 she had a lung biopsy shows poorly differentiated metastatic carcinoma, consistent with breast primary. -6 cycles of THP from 06/05/2018-09/18/2018. - PET scan on 09/29/2018 showed marked reduction in size and metabolic activity of bilateral pulmonary nodules and breast mass and adenopathy. -Maintenance Herceptin and Pertuzumab from 10/19/2018-12/26/2018 with progression. -MUGA scan on 11/30/2018 showed EF 61% - PET scan on 01/01/2019 showed progressive disease in the left breast primary, left axillary nodule and bilateral pulmonary metastasis. - She was switched to Kadcyla in the second line setting.  - She has completed 3 cycles of Kadcyla.  She is tolerating well. - She presents today with reports of redness to her left breast.  On exam left breast is erythematous, swollen, and warm to the touch.  This appears to be consistent with cellulitis.  Have recommended patient stop steroid cream and start Keflex 500 mg twice daily x7 days. - She will return to clinic in 1 week.

## 2019-03-22 DIAGNOSIS — E559 Vitamin D deficiency, unspecified: Secondary | ICD-10-CM | POA: Diagnosis not present

## 2019-03-22 DIAGNOSIS — M6281 Muscle weakness (generalized): Secondary | ICD-10-CM | POA: Diagnosis not present

## 2019-03-22 DIAGNOSIS — R52 Pain, unspecified: Secondary | ICD-10-CM | POA: Diagnosis not present

## 2019-03-22 DIAGNOSIS — I10 Essential (primary) hypertension: Secondary | ICD-10-CM | POA: Diagnosis not present

## 2019-03-30 ENCOUNTER — Inpatient Hospital Stay (HOSPITAL_COMMUNITY): Payer: Medicare Other | Admitting: Hematology

## 2019-03-30 ENCOUNTER — Inpatient Hospital Stay (HOSPITAL_COMMUNITY): Payer: Medicare Other

## 2019-03-30 ENCOUNTER — Ambulatory Visit (HOSPITAL_COMMUNITY): Payer: Medicare Other | Admitting: Hematology

## 2019-03-30 ENCOUNTER — Ambulatory Visit (HOSPITAL_COMMUNITY): Payer: Medicare Other | Admitting: Nurse Practitioner

## 2019-04-06 ENCOUNTER — Other Ambulatory Visit: Payer: Self-pay

## 2019-04-06 ENCOUNTER — Inpatient Hospital Stay (HOSPITAL_BASED_OUTPATIENT_CLINIC_OR_DEPARTMENT_OTHER): Payer: Medicare Other | Admitting: Nurse Practitioner

## 2019-04-06 ENCOUNTER — Inpatient Hospital Stay (HOSPITAL_COMMUNITY): Payer: Medicare Other | Attending: Hematology

## 2019-04-06 ENCOUNTER — Inpatient Hospital Stay (HOSPITAL_COMMUNITY): Payer: Medicare Other

## 2019-04-06 VITALS — BP 96/53 | HR 66 | Temp 97.5°F | Resp 18

## 2019-04-06 DIAGNOSIS — C773 Secondary and unspecified malignant neoplasm of axilla and upper limb lymph nodes: Secondary | ICD-10-CM | POA: Insufficient documentation

## 2019-04-06 DIAGNOSIS — C50912 Malignant neoplasm of unspecified site of left female breast: Secondary | ICD-10-CM | POA: Diagnosis not present

## 2019-04-06 DIAGNOSIS — Z79899 Other long term (current) drug therapy: Secondary | ICD-10-CM | POA: Diagnosis not present

## 2019-04-06 DIAGNOSIS — G893 Neoplasm related pain (acute) (chronic): Secondary | ICD-10-CM | POA: Diagnosis not present

## 2019-04-06 DIAGNOSIS — C7802 Secondary malignant neoplasm of left lung: Secondary | ICD-10-CM | POA: Diagnosis not present

## 2019-04-06 DIAGNOSIS — C50812 Malignant neoplasm of overlapping sites of left female breast: Secondary | ICD-10-CM | POA: Diagnosis present

## 2019-04-06 DIAGNOSIS — C7801 Secondary malignant neoplasm of right lung: Secondary | ICD-10-CM | POA: Diagnosis not present

## 2019-04-06 DIAGNOSIS — Z5112 Encounter for antineoplastic immunotherapy: Secondary | ICD-10-CM | POA: Insufficient documentation

## 2019-04-06 LAB — COMPREHENSIVE METABOLIC PANEL
ALT: 14 U/L (ref 0–44)
AST: 23 U/L (ref 15–41)
Albumin: 3.4 g/dL — ABNORMAL LOW (ref 3.5–5.0)
Alkaline Phosphatase: 85 U/L (ref 38–126)
Anion gap: 12 (ref 5–15)
BUN: 12 mg/dL (ref 8–23)
CO2: 25 mmol/L (ref 22–32)
Calcium: 9.1 mg/dL (ref 8.9–10.3)
Chloride: 101 mmol/L (ref 98–111)
Creatinine, Ser: 0.58 mg/dL (ref 0.44–1.00)
GFR calc Af Amer: >60 mL/min (ref >60)
GFR calc non Af Amer: >60 mL/min (ref >60)
Glucose, Bld: 150 mg/dL — ABNORMAL HIGH (ref 70–99)
Potassium: 3.9 mmol/L (ref 3.5–5.1)
Sodium: 138 mmol/L (ref 135–145)
Total Bilirubin: 0.4 mg/dL (ref 0.3–1.2)
Total Protein: 7.8 g/dL (ref 6.5–8.1)

## 2019-04-06 LAB — CBC WITH DIFFERENTIAL/PLATELET
Abs Immature Granulocytes: 0.03 10*3/uL (ref 0.00–0.07)
Basophils Absolute: 0 10*3/uL (ref 0.0–0.1)
Basophils Relative: 0 %
Eosinophils Absolute: 0.1 10*3/uL (ref 0.0–0.5)
Eosinophils Relative: 1 %
HCT: 38.7 % (ref 36.0–46.0)
Hemoglobin: 11.8 g/dL — ABNORMAL LOW (ref 12.0–15.0)
Immature Granulocytes: 0 %
Lymphocytes Relative: 14 %
Lymphs Abs: 1.3 10*3/uL (ref 0.7–4.0)
MCH: 26.2 pg (ref 26.0–34.0)
MCHC: 30.5 g/dL (ref 30.0–36.0)
MCV: 86 fL (ref 80.0–100.0)
Monocytes Absolute: 0.6 10*3/uL (ref 0.1–1.0)
Monocytes Relative: 6 %
Neutro Abs: 7.5 10*3/uL (ref 1.7–7.7)
Neutrophils Relative %: 79 %
Platelets: 318 10*3/uL (ref 150–400)
RBC: 4.5 MIL/uL (ref 3.87–5.11)
RDW: 13.9 % (ref 11.5–15.5)
WBC: 9.5 10*3/uL (ref 4.0–10.5)
nRBC: 0 % (ref 0.0–0.2)

## 2019-04-06 MED ORDER — ACETAMINOPHEN 325 MG PO TABS
650.0000 mg | ORAL_TABLET | Freq: Once | ORAL | Status: AC
  Administered 2019-04-06: 10:00:00 650 mg via ORAL
  Filled 2019-04-06: qty 2

## 2019-04-06 MED ORDER — DIPHENHYDRAMINE HCL 25 MG PO CAPS
50.0000 mg | ORAL_CAPSULE | Freq: Once | ORAL | Status: AC
  Administered 2019-04-06: 50 mg via ORAL
  Filled 2019-04-06: qty 2

## 2019-04-06 MED ORDER — SODIUM CHLORIDE 0.9 % IV SOLN
3.6300 mg/kg | Freq: Once | INTRAVENOUS | Status: AC
  Administered 2019-04-06: 260 mg via INTRAVENOUS
  Filled 2019-04-06: qty 5

## 2019-04-06 MED ORDER — HEPARIN SOD (PORK) LOCK FLUSH 100 UNIT/ML IV SOLN
500.0000 [IU] | Freq: Once | INTRAVENOUS | Status: AC | PRN
  Administered 2019-04-06: 500 [IU]

## 2019-04-06 MED ORDER — SODIUM CHLORIDE 0.9% FLUSH
10.0000 mL | INTRAVENOUS | Status: DC | PRN
  Administered 2019-04-06: 10 mL
  Filled 2019-04-06: qty 10

## 2019-04-06 MED ORDER — SODIUM CHLORIDE 0.9 % IV SOLN
Freq: Once | INTRAVENOUS | Status: AC
  Administered 2019-04-06: 10:00:00 via INTRAVENOUS

## 2019-04-06 NOTE — Patient Instructions (Signed)
[place] at [facility] Discharge Instructions  Follow up after your PET scan    Thank you for choosing [place] at [facility] to provide your oncology and hematology care.  To afford each patient quality time with our provider, please arrive at least 15 minutes before your scheduled appointment time.   If you have a lab appointment with the [place] please come in thru the  Main Entrance and check in at the main information desk  You need to re-schedule your appointment should you arrive 10 or more minutes late.  We strive to give you quality time with our providers, and arriving late affects you and other patients whose appointments are after yours.  Also, if you no show three or more times for appointments you may be dismissed from the clinic at the providers discretion.     Again, thank you for choosing [facility].  Our hope is that these requests will decrease the amount of time that you wait before being seen by our physicians.       _____________________________________________________________  Should you have questions after your visit to [facility], please contact our office at [phone] between the hours of 8:00 a.m. and 4:30 p.m.  Voicemails left after 4:00 p.m. will not be returned until the following business day.  For prescription refill requests, have your pharmacy contact our office and allow 72 hours.    Cancer Center Support Programs:   > Cancer Support Group  2nd Tuesday of the month 1pm-2pm, Journey Room

## 2019-04-06 NOTE — Assessment & Plan Note (Addendum)
1.  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 - PET/CT scan on 05/11/2018 shows bilateral hypermetabolic pulmonary nodules consistent with pulmonary metastasis with no metastatic disease outside of the thorax. - On 05/23/2018 she had a lung biopsy shows poorly differentiated metastatic carcinoma, consistent with breast primary. -6 cycles of THP from 06/05/2018-09/18/2018. - PET scan on 09/29/2018 showed marked reduction in size and metabolic activity of bilateral pulmonary nodules and breast mass and adenopathy. -Maintenance Herceptin and Pertuzumab from 10/19/2018-12/26/2018 with progression. -MUGA scan on 11/30/2018 showed EF 61% - PET scan on 01/01/2019 showed progressive disease in the left breast primary, left axillary nodule and bilateral pulmonary metastasis. - She was switched to Kadcyla in the second line setting.  Today is her fourth cycle. -She presented on 03/21/2019 with redness to her left breast.  Upon exam the left breast was erythematous, swollen, and warm to the touch.  It appeared to be consistent with cellulitis.  She was prescribed steroid cream and started on Keflex 500 mg twice daily for 7 days. -Upon physical assessment today patient's left breast was erythematous, swollen, hard and warm to the touch.  Patient reports painful to the touch.  Redness is been worse than last visit. -Labs on 04/06/2019 showed LFTs WNL, potassium 3.9, creatinine 0.58, hemoglobin 11.8, WBC 9.5 -We will schedule her a PET CT scan now and follow-up with Dr. Delton Coombes

## 2019-04-06 NOTE — Patient Instructions (Signed)
[facility] at [facility] Discharge Instructions  Labs drawn from portacath today   Thank you for choosing [facility] at [facility] to provide your oncology and hematology care.  To afford each patient quality time with our provider, please arrive at least 15 minutes before your scheduled appointment time.   If you have a lab appointment with the Cancer Center please come in thru the  Main Entrance and check in at the main information desk  You need to re-schedule your appointment should you arrive 10 or more minutes late.  We strive to give you quality time with our providers, and arriving late affects you and other patients whose appointments are after yours.  Also, if you no show three or more times for appointments you may be dismissed from the clinic at the providers discretion.     Again, thank you for choosing [facility].  Our hope is that these requests will decrease the amount of time that you wait before being seen by our physicians.       _____________________________________________________________  Should you have questions after your visit to [facility], please contact our office at [phone] between the hours of 8:00 a.m. and 4:30 p.m.  Voicemails left after 4:00 p.m. will not be returned until the following business day.  For prescription refill requests, have your pharmacy contact our office and allow 72 hours.    Cancer Center Support Programs:   > Cancer Support Group  2nd Tuesday of the month 1pm-2pm, Journey Room   

## 2019-04-06 NOTE — Progress Notes (Signed)
[place] reviewed with and pt seen by RLockamy NP and pt approved for Kadcyla infusion today per NP                 [name] [name] tolerated Kadcyla infusion well without complaints or incident. VSS upon discharge. Pt discharged self ambulatory in satisfactory condition accompanied by her caregiver

## 2019-04-06 NOTE — Patient Instructions (Signed)
[facility] Discharge Instructions for Patients Receiving Chemotherapy   Beginning January 23rd 2017 lab work for the Cancer Center will be done in the  Main lab at [place] on 1st floor. If you have a lab appointment with the Cancer Center please come in thru the  Main Entrance and check in at the main information desk   Today you received the following chemotherapy agents Kadcyla. Follow-up as scheduled. Call clinic for any questions or concerns  To help prevent nausea and vomiting after your treatment, we encourage you to take your nausea medication   If you develop nausea and vomiting, or diarrhea that is not controlled by your medication, call the clinic.  The clinic phone number is [phone]. Office hours are Monday-Friday 8:30am-5:00pm.  BELOW ARE SYMPTOMS THAT SHOULD BE REPORTED IMMEDIATELY:  *FEVER GREATER THAN 101.0 F  *CHILLS WITH OR WITHOUT FEVER  NAUSEA AND VOMITING THAT IS NOT CONTROLLED WITH YOUR NAUSEA MEDICATION  *UNUSUAL SHORTNESS OF BREATH  *UNUSUAL BRUISING OR BLEEDING  TENDERNESS IN MOUTH AND THROAT WITH OR WITHOUT PRESENCE OF ULCERS  *URINARY PROBLEMS  *BOWEL PROBLEMS  UNUSUAL RASH Items with * indicate a potential emergency and should be followed up as soon as possible. If you have an emergency after office hours please contact your primary care physician or go to the nearest emergency department.  Please call the clinic during office hours if you have any questions or concerns.   You may also contact the Patient Navigator at [phone] should you have any questions or need assistance in obtaining follow up care.      Resources For Cancer Patients and their Caregivers ? American Cancer Society: Can assist with transportation, wigs, general needs, runs Look Good Feel Better.        1-888-227-6333 ? Cancer Care: Provides financial assistance, online support groups, medication/co-pay assistance.  1-800-813-HOPE  (4673) ? [facility] Assists [place] cancer patients and their families through emotional , educational and financial support.  336-427-4357 ? [place] DSS Where to apply for food stamps, Medicaid and utility assistance. 336-342-1394 ? [facility]: Transportation to medical appointments. 336-347-2287 ? Social Security Administration: May apply for disability if have a Stage IV cancer. 336-342-7796 1-800-772-1213 ? [place] Aging, Disability and Transit Services: Assists with nutrition, care and transit needs. 336-349-2343         

## 2019-04-06 NOTE — Progress Notes (Signed)
[place], [place] [address]   CLINIC:  Medical Oncology/Hematology  PCP:  Renata Caprice, DO [address] [place] [address] [phone]   REASON FOR VISIT: Follow-up for breast cancer  CURRENT THERAPY: Kadcyla every 3 weeks  BRIEF ONCOLOGIC HISTORY:  Oncology History  Malignant neoplasm of overlapping sites of left female breast ([place])  05/15/2018 Initial Diagnosis   Malignant neoplasm of overlapping sites of left female breast ([place])   06/05/2018 - 01/17/2019 Chemotherapy   The patient had pegfilgrastim-cbqv (UDENYCA) injection 6 mg, 6 mg, Subcutaneous, Once, 6 of 6 cycles Administration: 6 mg (06/07/2018), 6 mg (06/28/2018), 6 mg (07/19/2018), 6 mg (08/30/2018), 6 mg (09/20/2018), 6 mg (08/10/2018) trastuzumab (HERCEPTIN) 600 mg in sodium chloride 0.9 % 250 mL chemo infusion, 609 mg, Intravenous,  Once, 10 of 13 cycles Administration: 600 mg (06/05/2018), 450 mg (06/26/2018), 450 mg (07/17/2018), 450 mg (08/28/2018), 450 mg (09/18/2018), 450 mg (08/07/2018), 450 mg (10/19/2018), 450 mg (11/09/2018), 450 mg (12/05/2018), 450 mg (12/26/2018) DOCEtaxel (TAXOTERE) 140 mg in sodium chloride 0.9 % 250 mL chemo infusion, 75 mg/m2 = 140 mg, Intravenous,  Once, 6 of 6 cycles Administration: 140 mg (06/05/2018), 140 mg (06/26/2018), 140 mg (07/17/2018), 140 mg (08/28/2018), 140 mg (09/18/2018), 140 mg (08/07/2018) ondansetron (ZOFRAN) 8 mg, dexamethasone (DECADRON) 4 mg in sodium chloride 0.9 % 50 mL IVPB, , Intravenous,  Once, 6 of 6 cycles Administration:  (06/05/2018),  (06/26/2018),  (07/17/2018),  (08/28/2018),  (09/18/2018),  (08/07/2018) pertuzumab (PERJETA) 840 mg in sodium chloride 0.9 % 250 mL chemo infusion, 840 mg, Intravenous, Once, 10 of 13 cycles Administration: 840 mg (06/05/2018), 420 mg (06/26/2018), 420 mg (07/17/2018), 420 mg (08/28/2018), 420 mg (09/18/2018), 420 mg (08/07/2018), 420 mg (10/19/2018), 420 mg (11/09/2018), 420 mg (12/05/2018), 420 mg  (12/26/2018)  for chemotherapy treatment.    HER2-positive carcinoma of left breast ([place])  05/30/2018 Initial Diagnosis   HER2-positive carcinoma of left breast ([place])   01/26/2019 -  Chemotherapy   The patient had ado-trastuzumab emtansine (KADCYLA) 260 mg in sodium chloride 0.9 % 250 mL chemo infusion, 240 mg, Intravenous, Once, 4 of 5 cycles Administration: 260 mg (01/26/2019), 260 mg (02/16/2019), 260 mg (03/09/2019)  for chemotherapy treatment.      INTERVAL HISTORY:  Ms. Fiorentino 63 y.o. female returns for routine follow-up for breast cancer.  She reports she has had occasional episodes of diarrhea since her last visit.  She is wearing a brief just in case.  She also says her left breast is still red and painful to the touch. Denies any nausea or vomiting. Denies any new pains. Had not noticed any recent bleeding such as epistaxis, hematuria or hematochezia. Denies recent chest pain on exertion, shortness of breath on minimal exertion, pre-syncopal episodes, or palpitations. Denies any numbness or tingling in hands or feet. Denies any recent fevers, infections, or recent hospitalizations. Patient reports appetite at 100 % and energy level at 50 %.  She is eating well maintaining her weight at this time.    REVIEW OF SYSTEMS:  Review of Systems  Constitutional: Positive for fatigue.  Gastrointestinal: Positive for diarrhea.  All other systems reviewed and are negative.    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    Past Surgical History:  Procedure Laterality Date   ABDOMINAL HYSTERECTOMY     BREAST BIOPSY Left    PORTACATH PLACEMENT Right 05/15/2018   Procedure: INSERTION PORT-A-CATH;  Surgeon: Aviva Signs, MD;  Location: [facility];  Service:  General;  Laterality: Right;     SOCIAL HISTORY:  Social History   Socioeconomic History   Marital status: Widowed    Spouse name: Not on file   Number of children: Not on file   Years of education: Not on file   Highest education level: Not on file  Occupational History   Not on file  Social Needs   Financial resource strain: Not on file   Food insecurity    Worry: Not on file    Inability: Not on file   Transportation needs    Medical: Not on file    Non-medical: Not on file  Tobacco Use   Smoking status: Unknown If Ever Smoked   Smokeless tobacco: Never Used  Substance and Sexual Activity   Alcohol use: Not Currently   Drug use: Never   Sexual activity: Not Currently  Lifestyle   Physical activity    Days per week: Not on file    Minutes per session: Not on file   Stress: Not on file  Relationships   Social connections    Talks on phone: Not on file    Gets together: Not on file    Attends religious service: Not on file    Active member of club or organization: Not on file    Attends meetings of clubs or organizations: Not on file    Relationship status: Not on file   Intimate partner violence    Fear of current or ex partner: Not on file    Emotionally abused: Not on file    Physically abused: Not on file    Forced sexual activity: Not on file  Other Topics Concern   Not on file  Social History Narrative   Lives at [facility] in [place]:  No family history on file.  CURRENT MEDICATIONS:  Outpatient Encounter Medications as of 04/06/2019  Medication Sig   acetaminophen (TYLENOL) 325 MG tablet Take 650 mg by mouth every 6 (six) hours as needed for mild pain.   ALPRAZolam (XANAX) 0.5 MG tablet    betamethasone dipropionate (DIPROLENE) 0.05 % cream    busPIRone (BUSPAR) 5 MG tablet Take 5 mg by mouth 2 (two) times daily.   cephALEXin (KEFLEX) 500 MG capsule    divalproex (DEPAKOTE) 250 MG DR tablet     divalproex (DEPAKOTE) 500 MG DR tablet    docusate sodium (COLACE) 100 MG capsule Take 100 mg by mouth 2 (two) times daily.   donepezil (ARICEPT) 10 MG tablet    HYDROcodone-acetaminophen (NORCO/VICODIN) 5-325 MG tablet    LORazepam (ATIVAN) 0.5 MG tablet Take 0.5 mg by mouth every 6 (six) hours as needed for anxiety.   memantine (NAMENDA) 10 MG tablet Take 10 mg by mouth 2 (two) times daily.   mirtazapine (REMERON) 7.5 MG tablet Take 7.5 mg by mouth at bedtime.   ondansetron (ZOFRAN) 4 MG tablet Take 4 mg by mouth every 8 (eight) hours as needed for nausea or vomiting.   Pertuzumab (PERJETA IV) Inject into the vein.   QUEtiapine (SEROQUEL) 25 MG tablet Take 12.5 mg by mouth 3 (three) times daily.   sertraline (ZOLOFT) 100 MG tablet Take 175 mg by  mouth daily.    sertraline (ZOLOFT) 50 MG tablet    Trastuzumab (HERCEPTIN IV) Inject into the vein.   [DISCONTINUED] prochlorperazine (COMPAZINE) 10 MG tablet Take 1 tablet (10 mg total) by mouth every 6 (six) hours as needed (Nausea or vomiting).   No facility-administered encounter medications on file as of 04/06/2019.     ALLERGIES:  No Known Allergies   PHYSICAL EXAM:  ECOG Performance status: 1  Vitals:   04/06/19 0923  BP: 113/79  Pulse: 79  Resp: 18  Temp: (!) 97.5 F (36.4 C)  SpO2: 98%   Filed Weights   04/06/19 0923  Weight: 155 lb (70.3 kg)    Physical Exam Constitutional:      Appearance: Normal appearance. She is normal weight.  Cardiovascular:     Rate and Rhythm: Normal rate and regular rhythm.     Heart sounds: Normal heart sounds.  Pulmonary:     Effort: Pulmonary effort is normal.     Breath sounds: Normal breath sounds.  Abdominal:     General: Bowel sounds are normal.     Palpations: Abdomen is soft.  Musculoskeletal: Normal range of motion.  Skin:    General: Skin is warm and dry.  Neurological:     Mental Status: She is alert and oriented to person, place, and time. Mental status is  at baseline.  Psychiatric:        Mood and Affect: Mood normal.        Behavior: Behavior normal.        Thought Content: Thought content normal.        Judgment: Judgment normal.   Breast: LEFT BREAST- red,inflammed, warm to the touch. It is hard and painful only to the touch.  LABORATORY DATA:  I have reviewed the labs as listed.  CBC    Component Value Date/Time   WBC 9.5 04/06/2019 0922   RBC 4.50 04/06/2019 0922   HGB 11.8 (L) 04/06/2019 0922   HCT 38.7 04/06/2019 0922   PLT 318 04/06/2019 0922   MCV 86.0 04/06/2019 0922   MCH 26.2 04/06/2019 0922   MCHC 30.5 04/06/2019 0922   RDW 13.9 04/06/2019 0922   LYMPHSABS 1.3 04/06/2019 0922   MONOABS 0.6 04/06/2019 0922   EOSABS 0.1 04/06/2019 0922   BASOSABS 0.0 04/06/2019 0922   CMP Latest Ref Rng & Units 04/06/2019 03/09/2019 02/16/2019  Glucose 70 - 99 mg/dL 150(H) 137(H) 85  BUN 8 - 23 mg/dL _0 Creatinine 0.44 - 1.00 mg/dL 0.58 0.57 0.52  Sodium 135 - 145 mmol/L 138 138 141  Potassium 3.5 - 5.1 mmol/L 3.9 3.7 4.1  Chloride 98 - 111 mmol/L 101 101 101  CO2 22 - 32 mmol/L _1 Calcium 8.9 - 10.3 mg/dL 9.1 9.2 9.3  Total Protein 6.5 - 8.1 g/dL 7.8 7.8 8.0  Total Bilirubin 0.3 - 1.2 mg/dL 0.4 0.5 0.5  Alkaline Phos 38 - 126 U/L 85 76 85  AST 15 - 41 U/L _2 ALT 0 - 44 U/L _3 I personally performed a face-to-face visit.  All questions were answered to patient's stated satisfaction. Encouraged patient to call with any new concerns or questions before his next visit to the cancer center and we can certain see him sooner, if needed.     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 - PET/CT scan on 05/11/2018 shows bilateral hypermetabolic pulmonary nodules consistent  with pulmonary metastasis with no metastatic disease outside of the thorax. - On 05/23/2018 she had a lung biopsy shows poorly differentiated metastatic carcinoma, consistent with breast primary. -6 cycles of THP from 06/05/2018-09/18/2018. - PET scan on 09/29/2018 showed marked reduction in size and metabolic activity of bilateral pulmonary nodules and breast mass and adenopathy. -Maintenance Herceptin and Pertuzumab from 10/19/2018-12/26/2018 with progression. -MUGA scan on 11/30/2018 showed EF 61% - PET scan on 01/01/2019 showed progressive disease in the left breast primary, left axillary nodule and bilateral pulmonary metastasis. - She was switched to Kadcyla in the second line setting.  Today is her fourth cycle. -She presented on 03/21/2019 with redness to her left breast.  Upon exam the left breast was erythematous, swollen, and warm to the touch.  It appeared to be consistent with cellulitis.  She was prescribed steroid cream and started on Keflex 500 mg twice daily for 7 days. -Upon physical assessment today patient's left breast was erythematous, swollen, hard and warm to the touch.  Patient reports painful to the touch.  Redness is been worse than last visit. -Labs on 04/06/2019 showed LFTs WNL, potassium 3.9, creatinine 0.58, hemoglobin 11.8, WBC 9.5 -We will schedule her a PET CT scan now and follow-up with Dr. Delton Coombes       Orders placed this encounter:  Orders Placed This Encounter  Procedures   NM PET Image Restag (PS) Skull Base To Thigh   CBC with Differential/Platelet   Comprehensive metabolic panel   Lactate dehydrogenase   Cancer antigen 15-3   Cancer antigen 27.29      Francene Finders, FNP-C [facility] [phone]

## 2019-04-07 LAB — CANCER ANTIGEN 27.29: CA 27.29: 28 U/mL (ref 0.0–38.6)

## 2019-04-07 LAB — CANCER ANTIGEN 15-3: CA 15-3: 26.8 U/mL — ABNORMAL HIGH (ref 0.0–25.0)

## 2019-04-09 ENCOUNTER — Other Ambulatory Visit (HOSPITAL_COMMUNITY): Payer: Medicare Other

## 2019-04-09 ENCOUNTER — Ambulatory Visit (HOSPITAL_COMMUNITY)
Admission: RE | Admit: 2019-04-09 | Discharge: 2019-04-09 | Disposition: A | Discharge status: Home / Self Care | Payer: Medicare Other | Source: Ambulatory Visit | Attending: Nurse Practitioner | Admitting: Nurse Practitioner

## 2019-04-09 ENCOUNTER — Other Ambulatory Visit: Payer: Self-pay

## 2019-04-09 DIAGNOSIS — C50912 Malignant neoplasm of unspecified site of left female breast: Secondary | ICD-10-CM

## 2019-04-09 MED ORDER — FLUDEOXYGLUCOSE F - 18 (FDG) INJECTION
9.1600 | Freq: Once | INTRAVENOUS | Status: AC | PRN
  Administered 2019-04-09: 9.16 via INTRAVENOUS

## 2019-04-10 ENCOUNTER — Inpatient Hospital Stay (HOSPITAL_COMMUNITY): Payer: Medicare Other

## 2019-04-10 ENCOUNTER — Inpatient Hospital Stay (HOSPITAL_BASED_OUTPATIENT_CLINIC_OR_DEPARTMENT_OTHER): Payer: Medicare Other | Admitting: Hematology

## 2019-04-10 ENCOUNTER — Encounter (HOSPITAL_COMMUNITY): Payer: Self-pay | Admitting: Hematology

## 2019-04-10 ENCOUNTER — Other Ambulatory Visit: Payer: Self-pay

## 2019-04-10 VITALS — BP 103/63 | HR 92 | Temp 98.0°F | Resp 20 | Wt 152.3 lb

## 2019-04-10 DIAGNOSIS — Z5112 Encounter for antineoplastic immunotherapy: Secondary | ICD-10-CM | POA: Diagnosis not present

## 2019-04-10 DIAGNOSIS — C50912 Malignant neoplasm of unspecified site of left female breast: Secondary | ICD-10-CM

## 2019-04-10 DIAGNOSIS — Z79899 Other long term (current) drug therapy: Secondary | ICD-10-CM | POA: Diagnosis not present

## 2019-04-10 DIAGNOSIS — C773 Secondary and unspecified malignant neoplasm of axilla and upper limb lymph nodes: Secondary | ICD-10-CM | POA: Diagnosis not present

## 2019-04-10 DIAGNOSIS — C7801 Secondary malignant neoplasm of right lung: Secondary | ICD-10-CM | POA: Diagnosis not present

## 2019-04-10 DIAGNOSIS — G893 Neoplasm related pain (acute) (chronic): Secondary | ICD-10-CM | POA: Diagnosis not present

## 2019-04-10 DIAGNOSIS — C7802 Secondary malignant neoplasm of left lung: Secondary | ICD-10-CM | POA: Diagnosis not present

## 2019-04-10 LAB — CBC WITH DIFFERENTIAL/PLATELET
Abs Immature Granulocytes: 0.04 10*3/uL (ref 0.00–0.07)
Basophils Absolute: 0 10*3/uL (ref 0.0–0.1)
Basophils Relative: 0 %
Eosinophils Absolute: 0.1 10*3/uL (ref 0.0–0.5)
Eosinophils Relative: 1 %
HCT: 41.4 % (ref 36.0–46.0)
Hemoglobin: 12.7 g/dL (ref 12.0–15.0)
Immature Granulocytes: 0 %
Lymphocytes Relative: 14 %
Lymphs Abs: 1.4 10*3/uL (ref 0.7–4.0)
MCH: 26 pg (ref 26.0–34.0)
MCHC: 30.7 g/dL (ref 30.0–36.0)
MCV: 84.8 fL (ref 80.0–100.0)
Monocytes Absolute: 1 10*3/uL (ref 0.1–1.0)
Monocytes Relative: 10 %
Neutro Abs: 8 10*3/uL — ABNORMAL HIGH (ref 1.7–7.7)
Neutrophils Relative %: 75 %
Platelets: 225 10*3/uL (ref 150–400)
RBC: 4.88 MIL/uL (ref 3.87–5.11)
RDW: 14.1 % (ref 11.5–15.5)
WBC: 10.6 10*3/uL — ABNORMAL HIGH (ref 4.0–10.5)
nRBC: 0 % (ref 0.0–0.2)

## 2019-04-10 LAB — COMPREHENSIVE METABOLIC PANEL
ALT: 12 U/L (ref 0–44)
AST: 34 U/L (ref 15–41)
Albumin: 3.5 g/dL (ref 3.5–5.0)
Alkaline Phosphatase: 90 U/L (ref 38–126)
Anion gap: 11 (ref 5–15)
BUN: 15 mg/dL (ref 8–23)
CO2: 28 mmol/L (ref 22–32)
Calcium: 9.6 mg/dL (ref 8.9–10.3)
Chloride: 100 mmol/L (ref 98–111)
Creatinine, Ser: 0.62 mg/dL (ref 0.44–1.00)
GFR calc Af Amer: >60 mL/min (ref >60)
GFR calc non Af Amer: >60 mL/min (ref >60)
Glucose, Bld: 112 mg/dL — ABNORMAL HIGH (ref 70–99)
Potassium: 3.8 mmol/L (ref 3.5–5.1)
Sodium: 139 mmol/L (ref 135–145)
Total Bilirubin: 0.5 mg/dL (ref 0.3–1.2)
Total Protein: 8.7 g/dL — ABNORMAL HIGH (ref 6.5–8.1)

## 2019-04-10 LAB — LACTATE DEHYDROGENASE: LDH: 216 U/L — ABNORMAL HIGH (ref 98–192)

## 2019-04-10 NOTE — Patient Instructions (Addendum)
[place] at [facility] Discharge Instructions  You were seen today by Dr. Delton Coombes. He went over your recent lab results. He will schedule you for a MRI of the brain and an ECHO. He will see you back in 3 weeks for labs and follow up.   Thank you for choosing [place] at [facility] to provide your oncology and hematology care.  To afford each patient quality time with our provider, please arrive at least 15 minutes before your scheduled appointment time.   If you have a lab appointment with the [place] please come in thru the  Main Entrance and check in at the main information desk  You need to re-schedule your appointment should you arrive 10 or more minutes late.  We strive to give you quality time with our providers, and arriving late affects you and other patients whose appointments are after yours.  Also, if you no show three or more times for appointments you may be dismissed from the clinic at the providers discretion.     Again, thank you for choosing [facility].  Our hope is that these requests will decrease the amount of time that you wait before being seen by our physicians.       _____________________________________________________________  Should you have questions after your visit to [facility], please contact our office at [phone] between the hours of 8:00 a.m. and 4:30 p.m.  Voicemails left after 4:00 p.m. will not be returned until the following business day.  For prescription refill requests, have your pharmacy contact our office and allow 72 hours.    Cancer Center Support Programs:   > Cancer Support Group  2nd Tuesday of the month 1pm-2pm, Journey Room

## 2019-04-10 NOTE — Assessment & Plan Note (Signed)
1.  Metastatic HER-2 positive left breast cancer to the lungs: - Biopsy of the left breast 2 o'clock position and left axillary lymph node showed invasive mammary carcinoma, ER/PR negative, Ki-67 50%, HER-2 positive by FISH. - 6 cycles of THP from 06/05/2018-09/18/2018 with maintenance Herceptin and Pertuzumab continued until 12/26/2018 with progression. - 4 cycles of Kadcyla from 01/26/2019 through 04/06/2019 with progression. -MUGA scan on 11/30/2018 shows ejection fraction of 61%. - Physical exam today shows erythema over the left breast with mass occupying the majority of the left breast.  Left axillary adenopathy has also increased in size. -We reviewed results of the PET scan dated 04/09/2019 which showed 6.7 x 5.9 cm left breast mass (previously 3.4 x 2.9 cm).  Left axillary lymph node measures 2 cm, previously 8 mm.  Left upper lobe mass measuring 2.9 x 3.1 cm, previously 2.2 x 1.5 cm.  Right lower lobe mass measures 3.4 x 3.5 cm (previously 1.8 x 1.7 cm).  No bone metastasis.  No metastasis in the abdomen or pelvis. - I have discussed the findings with the patient's sister Neoma Laming who is her healthcare proxy. - We had a prolonged discussion about further treatment versus best supportive care. - Patient and her sister opted for active therapy.  I have discussed therapeutic options including tucatinib(HER2 CLIMB regimen) based regimen versus Herceptin deruxtecan.  She lives at a nursing home.  Because of her mental capacity, we have decided against a pill based regimen. - We talked about Herceptin deruxtecan given at 5.4 mg/kg every 3 weeks.  We discussed about side effects in detail. - She has more problems with her mental status recently.  I have recommended doing an MRI of the brain.  If there is brain mets, will consider Tucatinib based regimen. -I have also recommended another echocardiogram to evaluate LVEF. - We will see her back in 2 weeks prior to start of therapy.  2.  Left breast pain: -She  is currently ordered to receive hydrocodone 5 mg every 6 hours as needed. - Patient's sister expressed concern that the patient might not be able to ask for the pain medication. -Hence I have changed hydrocodone 5 mg twice daily as a standing drug.

## 2019-04-10 NOTE — Progress Notes (Signed)
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      BRIEF ONCOLOGIC HISTORY:  Oncology History  Malignant neoplasm of overlapping sites of left female breast ([place])  05/15/2018 Initial Diagnosis   Malignant neoplasm of overlapping sites of left female breast ()   06/05/2018 - 01/17/2019 Chemotherapy   The patient had pegfilgrastim-cbqv (UDENYCA) injection 6 mg, 6 mg, Subcutaneous, Once, 6 of 6 cycles Administration: 6 mg (06/07/2018), 6 mg (06/28/2018), 6 mg (07/19/2018), 6 mg (08/30/2018), 6 mg (09/20/2018), 6 mg (08/10/2018) trastuzumab (HERCEPTIN) 600 mg in sodium chloride 0.9 % 250 mL chemo infusion, 609 mg, Intravenous,  Once, 10 of 13 cycles Administration: 600 mg (06/05/2018), 450 mg (06/26/2018), 450 mg (07/17/2018), 450 mg (08/28/2018), 450 mg (09/18/2018), 450 mg (08/07/2018), 450 mg (10/19/2018), 450 mg (11/09/2018), 450 mg (12/05/2018), 450 mg (12/26/2018) DOCEtaxel (TAXOTERE) 140 mg in sodium chloride 0.9 % 250 mL chemo infusion, 75 mg/m2 = 140 mg, Intravenous,  Once, 6 of 6 cycles Administration: 140 mg (06/05/2018), 140 mg (06/26/2018), 140 mg (07/17/2018), 140 mg (08/28/2018), 140 mg (09/18/2018), 140 mg (08/07/2018) ondansetron (ZOFRAN) 8 mg, dexamethasone (DECADRON) 4 mg in sodium chloride 0.9 % 50 mL IVPB, , Intravenous,  Once, 6 of 6 cycles Administration:  (06/05/2018),  (06/26/2018),  (07/17/2018),  (08/28/2018),  (09/18/2018),  (08/07/2018) pertuzumab (PERJETA) 840 mg in sodium chloride 0.9 % 250 mL chemo infusion, 840 mg, Intravenous, Once, 10 of 13 cycles Administration: 840 mg (06/05/2018), 420 mg (06/26/2018), 420 mg (07/17/2018), 420 mg (08/28/2018), 420 mg (09/18/2018), 420 mg (08/07/2018), 420 mg (10/19/2018), 420 mg  (11/09/2018), 420 mg (12/05/2018), 420 mg (12/26/2018)  for chemotherapy treatment.    04/26/2019 -  Chemotherapy   The patient had PALONOSETRON HCL INJECTION 0.25 MG/5ML, 0.25 mg, Intravenous,  Once, 0 of 6 cycles fam-trastuzumab deruxtecan-nxki (ENHERTU) 372 mg in dextrose 5 % 100 mL chemo infusion, 5.4 mg/kg, Intravenous,  Once, 0 of 6 cycles  for chemotherapy treatment.    HER2-positive carcinoma of left breast ([place])  05/30/2018 Initial Diagnosis   HER2-positive carcinoma of left breast ([place])   01/26/2019 - 04/26/2019 Chemotherapy   The patient had ado-trastuzumab emtansine (KADCYLA) 260 mg in sodium chloride 0.9 % 250 mL chemo infusion, 240 mg, Intravenous, Once, 4 of 5 cycles Administration: 260 mg (01/26/2019), 260 mg (02/16/2019), 260 mg (03/09/2019), 260 mg (04/06/2019)  for chemotherapy treatment.    04/26/2019 -  Chemotherapy   The patient had PALONOSETRON HCL INJECTION 0.25 MG/5ML, 0.25 mg, Intravenous,  Once, 0 of 6 cycles fam-trastuzumab deruxtecan-nxki (ENHERTU) 372 mg in dextrose 5 % 100 mL chemo infusion, 5.4 mg/kg, Intravenous,  Once, 0 of 6 cycles  for chemotherapy treatment.       CANCER STAGING: Cancer Staging No matching staging information was found for the patient.   INTERVAL HISTORY:  Ms. Fehrman 63 y.o. female seen for follow-up of HER-2 positive metastatic breast cancer.  She received Kadcyla last week.  Denies any nausea, vomiting, diarrhea or constipation.  Appetite is 100%.  Energy levels are 50%.  Complains of left breast pain on and off.  I have also talked to the patient's sister over the phone.  Patient's mental status has declined over the last few weeks.  Denies any tingling or numbness in  extremities.  Denies any fevers or chills.  No headaches or vision changes reported.  Denies any symptoms of PND or orthopnea.  Denies any ER visits or hospitalizations.   REVIEW OF SYSTEMS:  Review of Systems  All other systems reviewed and are negative.    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    Past Surgical History:  Procedure Laterality Date   ABDOMINAL HYSTERECTOMY     BREAST BIOPSY Left    PORTACATH PLACEMENT Right 05/15/2018   Procedure: INSERTION PORT-A-CATH;  Surgeon: Aviva Signs, MD;  Location: [facility];  Service: General;  Laterality: Right;     SOCIAL HISTORY:  Social History   Socioeconomic History   Marital status: Widowed    Spouse name: Not on file   Number of children: Not on file   Years of education: Not on file   Highest education level: Not on file  Occupational History   Not on file  Social Needs   Financial resource strain: Not on file   Food insecurity    Worry: Not on file    Inability: Not on file   Transportation needs    Medical: Not on file    Non-medical: Not on file  Tobacco Use   Smoking status: Unknown If Ever Smoked   Smokeless tobacco: Never Used  Substance and Sexual Activity   Alcohol use: Not Currently   Drug use: Never   Sexual activity: Not Currently  Lifestyle   Physical activity    Days per week: Not on file    Minutes per session: Not on file   Stress: Not on file  Relationships   Social connections    Talks on phone: Not on file    Gets together: Not on file    Attends religious service: Not on file    Active member of club or organization: Not on file    Attends meetings of clubs or organizations: Not on file    Relationship status: Not on file   Intimate partner violence    Fear of current or ex partner: Not on file    Emotionally abused: Not on file    Physically abused: Not on file    Forced sexual activity: Not on file  Other Topics Concern   Not on file  Social History Narrative   Lives at [facility] in [place]:  History reviewed. No pertinent family history.  CURRENT MEDICATIONS:  Outpatient Encounter Medications as of 04/10/2019  Medication Sig   acetaminophen (TYLENOL) 325 MG tablet Take 650 mg by mouth every 6 (six) hours as needed for mild pain.   ALPRAZolam (XANAX) 0.5 MG tablet Take 0.5 mg by mouth 3 (three) times daily as needed.    betamethasone dipropionate (DIPROLENE) 0.05 % cream 2 (two) times daily. Apply to left breast two times a day for dermatitis use on days that resident doesn't go for chemotherapy.   busPIRone (BUSPAR) 5 MG tablet Take 5 mg by mouth 2 (two) times daily.   divalproex (DEPAKOTE) 250 MG DR tablet Take 250 mg by mouth at bedtime.    divalproex (DEPAKOTE) 500 MG DR tablet Take 500 mg by mouth every morning.  docusate sodium (COLACE) 100 MG capsule Take 200 mg by mouth daily. May increase to 2 capsules in the am and 2 capsules in the pm. If no BM, call the [place].   donepezil (ARICEPT) 10 MG tablet Take 10 mg by mouth at bedtime.    HYDROcodone-acetaminophen (NORCO/VICODIN) 5-325 MG tablet Take 1 tablet by mouth every 6 (six) hours as needed.    memantine (NAMENDA) 10 MG tablet Take 10 mg by mouth 2 (two) times daily.   mirtazapine (REMERON) 7.5 MG tablet Take 7.5 mg by mouth at bedtime.   QUEtiapine (SEROQUEL) 25 MG tablet Take 25 mg by mouth 2 (two) times daily.    sertraline (ZOLOFT) 100 MG tablet Take 100 mg by mouth daily.    VITAMIN D, CHOLECALCIFEROL, PO Take 2,000 Int'l Units by mouth daily.   [DISCONTINUED] QUEtiapine (SEROQUEL) 25 MG tablet Take 12.5 mg by mouth 3 (three) times daily.   divalproex (DEPAKOTE) 250 MG DR tablet Take 250 mg by mouth 3 (three) times daily.   [DISCONTINUED] cephALEXin (KEFLEX) 500 MG capsule    [DISCONTINUED] LORazepam (ATIVAN) 0.5 MG tablet Take 0.5 mg by mouth every 6 (six) hours as needed for anxiety.   [DISCONTINUED] ondansetron (ZOFRAN) 4 MG tablet Take 4 mg  by mouth every 8 (eight) hours as needed for nausea or vomiting.   [DISCONTINUED] Pertuzumab (PERJETA IV) Inject into the vein.   [DISCONTINUED] prochlorperazine (COMPAZINE) 10 MG tablet Take 1 tablet (10 mg total) by mouth every 6 (six) hours as needed (Nausea or vomiting).   [DISCONTINUED] sertraline (ZOLOFT) 50 MG tablet    [DISCONTINUED] Trastuzumab (HERCEPTIN IV) Inject into the vein.   No facility-administered encounter medications on file as of 04/10/2019.     ALLERGIES:  No Known Allergies   PHYSICAL EXAM:  ECOG Performance status: 1  Vitals:   04/10/19 1039  BP: 103/63  Pulse: 92  Resp: 20  Temp: 98 F (36.7 C)  SpO2: 99%   Filed Weights   04/10/19 1039  Weight: 152 lb 4.8 oz (69.1 kg)    Physical Exam Vitals signs reviewed.  Constitutional:      Appearance: Normal appearance.  Cardiovascular:     Rate and Rhythm: Normal rate and regular rhythm.     Heart sounds: Normal heart sounds.  Pulmonary:     Effort: Pulmonary effort is normal.     Breath sounds: Normal breath sounds.  Abdominal:     General: There is no distension.     Palpations: Abdomen is soft. There is no mass.  Musculoskeletal:        General: No swelling.  Skin:    General: Skin is warm.  Neurological:     General: No focal deficit present.     Mental Status: She is alert and oriented to person, place, and time.  Psychiatric:        Mood and Affect: Mood normal.        Behavior: Behavior normal.      LABORATORY DATA:  I have reviewed the labs as listed.  CBC    Component Value Date/Time   WBC 10.6 (H) 04/10/2019 1022   RBC 4.88 04/10/2019 1022   HGB 12.7 04/10/2019 1022   HCT 41.4 04/10/2019 1022   PLT 225 04/10/2019 1022   MCV 84.8 04/10/2019 1022   MCH 26.0 04/10/2019 1022   MCHC 30.7 04/10/2019 1022   RDW 14.1 04/10/2019 1022   LYMPHSABS 1.4 04/10/2019 1022   MONOABS 1.0 04/10/2019 1022  EOSABS 0.1 04/10/2019 1022   BASOSABS 0.0 04/10/2019 1022   CMP Latest Ref  Rng & Units 04/10/2019 04/06/2019 03/09/2019  Glucose 70 - 99 mg/dL 112(H) 150(H) 137(H)  BUN 8 - 23 mg/dL '15 12 12  ' Creatinine 0.44 - 1.00 mg/dL 0.62 0.58 0.57  Sodium 135 - 145 mmol/L 139 138 138  Potassium 3.5 - 5.1 mmol/L 3.8 3.9 3.7  Chloride 98 - 111 mmol/L 100 101 101  CO2 22 - 32 mmol/L '28 25 26  ' Calcium 8.9 - 10.3 mg/dL 9.6 9.1 9.2  Total Protein 6.5 - 8.1 g/dL 8.7(H) 7.8 7.8  Total Bilirubin 0.3 - 1.2 mg/dL 0.5 0.4 0.5  Alkaline Phos 38 - 126 U/L 90 85 76  AST 15 - 41 U/L 34 23 21  ALT 0 - 44 U/L '12 14 14       ' DIAGNOSTIC IMAGING:  I have independently reviewed the scans and discussed with the patient.   I have reviewed Venita Lick LPN's note and agree with the documentation.  I personally performed a face-to-face visit, made revisions and my assessment and plan is as follows.    ASSESSMENT & PLAN:   HER2-positive carcinoma of left breast ([place]) 1.  Metastatic HER-2 positive left breast cancer to the lungs: - Biopsy of the left breast 2 o'clock position and left axillary lymph node showed invasive mammary carcinoma, ER/PR negative, Ki-67 50%, HER-2 positive by FISH. - 6 cycles of THP from 06/05/2018-09/18/2018 with maintenance Herceptin and Pertuzumab continued until 12/26/2018 with progression. - 4 cycles of Kadcyla from 01/26/2019 through 04/06/2019 with progression. -MUGA scan on 11/30/2018 shows ejection fraction of 61%. - Physical exam today shows erythema over the left breast with mass occupying the majority of the left breast.  Left axillary adenopathy has also increased in size. -We reviewed results of the PET scan dated 04/09/2019 which showed 6.7 x 5.9 cm left breast mass (previously 3.4 x 2.9 cm).  Left axillary lymph node measures 2 cm, previously 8 mm.  Left upper lobe mass measuring 2.9 x 3.1 cm, previously 2.2 x 1.5 cm.  Right lower lobe mass measures 3.4 x 3.5 cm (previously 1.8 x 1.7 cm).  No bone metastasis.  No metastasis in the abdomen or pelvis. - I have  discussed the findings with the patient's sister Neoma Laming who is her healthcare proxy. - We had a prolonged discussion about further treatment versus best supportive care. - Patient and her sister opted for active therapy.  I have discussed therapeutic options including tucatinib(HER2 CLIMB regimen) based regimen versus Herceptin deruxtecan.  She lives at a nursing home.  Because of her mental capacity, we have decided against a pill based regimen. - We talked about Herceptin deruxtecan given at 5.4 mg/kg every 3 weeks.  We discussed about side effects in detail. - She has more problems with her mental status recently.  I have recommended doing an MRI of the brain.  If there is brain mets, will consider Tucatinib based regimen. -I have also recommended another echocardiogram to evaluate LVEF. - We will see her back in 2 weeks prior to start of therapy.  2.  Left breast pain: -She is currently ordered to receive hydrocodone 5 mg every 6 hours as needed. - Patient's sister expressed concern that the patient might not be able to ask for the pain medication. -Hence I have changed hydrocodone 5 mg twice daily as a standing drug.  Total time spent is 40 minutes with more than 50% of the time  spent face-to-face discussing scan results, change in treatment plan, counseling and coordination of care.    Orders placed this encounter:  Orders Placed This Encounter  Procedures   MR Brain W Wo Contrast   CBC with Differential/Platelet   Comprehensive metabolic panel   ECHOCARDIOGRAM COMPLETE      Derek Jack, MD [place] [phone]

## 2019-04-10 NOTE — Progress Notes (Signed)
DISCONTINUE ON PATHWAY REGIMEN - Breast     A cycle is every 21 days:     Ado-trastuzumab emtansine   **Always confirm dose/schedule in your pharmacy ordering system**  REASON: Disease Progression PRIOR TREATMENT: [id number]: Ado-trastuzumab Emtansine 3.6 mg/kg q21 Days TREATMENT RESPONSE: Progressive Disease (PD)  START ON PATHWAY REGIMEN - Breast     A cycle is every 21 days:     Fam-trastuzumab deruxtecan-nxki   **Always confirm dose/schedule in your pharmacy ordering system**  Patient Characteristics: Distant Metastases or Locoregional Recurrent Disease - Unresected or Locally Advanced Unresectable Disease Progressing after Neoadjuvant and Local Therapies, HER2 Positive, ER Negative/Unknown, Chemotherapy, Third Line Therapeutic Status: Distant Metastases BRCA Mutation Status: Did Not Order Test ER Status: Negative (-) HER2 Status: Positive (+) PR Status: Negative (-) Line of Therapy: Third Line Intent of Therapy: Non-Curative / Palliative Intent, Discussed with Patient 

## 2019-04-11 DIAGNOSIS — C7800 Secondary malignant neoplasm of unspecified lung: Secondary | ICD-10-CM | POA: Diagnosis not present

## 2019-04-11 DIAGNOSIS — I10 Essential (primary) hypertension: Secondary | ICD-10-CM | POA: Diagnosis not present

## 2019-04-11 DIAGNOSIS — C50812 Malignant neoplasm of overlapping sites of left female breast: Secondary | ICD-10-CM | POA: Diagnosis not present

## 2019-04-11 DIAGNOSIS — C50912 Malignant neoplasm of unspecified site of left female breast: Secondary | ICD-10-CM | POA: Diagnosis not present

## 2019-04-12 DIAGNOSIS — M6281 Muscle weakness (generalized): Secondary | ICD-10-CM | POA: Diagnosis not present

## 2019-04-12 DIAGNOSIS — I10 Essential (primary) hypertension: Secondary | ICD-10-CM | POA: Diagnosis not present

## 2019-04-12 DIAGNOSIS — R52 Pain, unspecified: Secondary | ICD-10-CM | POA: Diagnosis not present

## 2019-04-12 DIAGNOSIS — E559 Vitamin D deficiency, unspecified: Secondary | ICD-10-CM | POA: Diagnosis not present

## 2019-04-13 ENCOUNTER — Ambulatory Visit (HOSPITAL_COMMUNITY): Payer: Medicare Other

## 2019-04-13 ENCOUNTER — Other Ambulatory Visit (HOSPITAL_COMMUNITY): Payer: Medicare Other

## 2019-04-24 ENCOUNTER — Other Ambulatory Visit: Payer: Self-pay

## 2019-04-24 ENCOUNTER — Ambulatory Visit (HOSPITAL_COMMUNITY)
Admission: RE | Admit: 2019-04-24 | Discharge: 2019-04-24 | Disposition: A | Discharge status: Home / Self Care | Payer: Medicare Other | Source: Ambulatory Visit | Attending: Hematology | Admitting: Hematology

## 2019-04-24 ENCOUNTER — Ambulatory Visit (HOSPITAL_BASED_OUTPATIENT_CLINIC_OR_DEPARTMENT_OTHER)
Admission: RE | Admit: 2019-04-24 | Discharge: 2019-04-24 | Disposition: A | Discharge status: Home / Self Care | Payer: Medicare Other | Source: Ambulatory Visit | Attending: Hematology | Admitting: Hematology

## 2019-04-24 DIAGNOSIS — G319 Degenerative disease of nervous system, unspecified: Secondary | ICD-10-CM | POA: Diagnosis not present

## 2019-04-24 DIAGNOSIS — C50912 Malignant neoplasm of unspecified site of left female breast: Secondary | ICD-10-CM

## 2019-04-24 DIAGNOSIS — I059 Rheumatic mitral valve disease, unspecified: Secondary | ICD-10-CM | POA: Insufficient documentation

## 2019-04-24 DIAGNOSIS — C50919 Malignant neoplasm of unspecified site of unspecified female breast: Secondary | ICD-10-CM | POA: Diagnosis not present

## 2019-04-24 MED ORDER — GADOBUTROL 1 MMOL/ML IV SOLN
7.0000 mL | Freq: Once | INTRAVENOUS | Status: AC | PRN
  Administered 2019-04-24: 7 mL via INTRAVENOUS

## 2019-04-24 NOTE — Progress Notes (Signed)
*  PRELIMINARY RESULTS* Echocardiogram 2D Echocardiogram has been performed.  [name] [name] 04/24/2019, 3:12 PM

## 2019-04-27 ENCOUNTER — Ambulatory Visit (HOSPITAL_COMMUNITY): Payer: Medicare Other

## 2019-04-27 ENCOUNTER — Other Ambulatory Visit (HOSPITAL_COMMUNITY): Payer: Medicare Other

## 2019-04-27 ENCOUNTER — Ambulatory Visit (HOSPITAL_COMMUNITY): Payer: Medicare Other | Admitting: Nurse Practitioner

## 2019-04-27 NOTE — Patient Instructions (Signed)
Fam-trastuzumab deruxtecan-nxki (Enhertu)  About This Drug Fam-trastuzumab deruxtecan-nxki is used to treat cancer. It is given in the vein (IV).  Possible Side Effects . Bone marrow suppression. This is a decrease in the number of white blood cells, red blood cells, and platelets. This may raise your risk of infection, make you tired and weak (fatigue), and raise your risk of bleeding. . Tiredness . Nausea and vomiting (throwing up) . Constipation (not able to move bowels) . Diarrhea (loose bowel movements) . Decreased appetite (decreased hunger) . Cough . Hair loss. Hair loss is often temporary, although with certain medicine, hair loss can sometimes be permanent. Hair loss may happen suddenly or gradually. If you lose hair, you may lose it from your head, face, armpits, pubic area, chest, and/or legs. You may also notice your hair getting thin.  Note: Each of the side effects above was reported in 20% or greater of patients treated with famtrastuzumab deruxtecan-nxki. Not all possible side effects are included above.  Warnings and Precautions . Inflammation (swelling) or scarring of the lungs which may be life-threatening. You may have a dry cough or trouble breathing. . Severe decrease in white blood cells, including fever in the setting of decreased white blood cells, which is a serious condition. . Changes in your heart's ability to pump blood properly  Note: Some of the side effects above are very rare. If you have concerns and/or questions, please discuss them with your medical team.  Important Information . This drug may be present in the saliva, tears, sweat, urine, stool, vomit, semen, and vaginal secretions. Talk to your doctor and/or your nurse about the necessary precautions to take during this time.  Treating Side Effects . Manage tiredness by pacing your activities for the day. . Be sure to include periods of rest between energy-draining activities. . To  decrease the risk of infection, wash your hands regularly. . Avoid close contact with people who have a cold, the flu, or other infections. . Take your temperature as your doctor or nurse tells you, and whenever you feel like you may have a fever. . To help decrease the risk of bleeding, use a soft toothbrush. Check with your nurse before using dental floss. . Be very careful when using knives or tools. . Use an electric shaver instead of a razor. . Drink plenty of fluids (a minimum of eight glasses per day is recommended). . If you throw up or have loose bowel movements, you should drink more fluids so that you do not become dehydrated (lack of water in the body from losing too much fluid). . To help with nausea and vomiting, eat small, frequent meals instead of three large meals a day. Choose foods and drinks that are at room temperature. Ask your nurse or doctor about other helpful tips and medicine that is available to help stop or lessen these symptoms. . Ask your doctor or nurse about medicines that are available to help stop or lessen constipation or diarrhea. . If you have diarrhea, eat low-fiber foods that are high in protein and calories and avoid foods that can irritate your digestive tracts or lead to cramping. . If you are not able to move your bowels, check with your doctor or nurse before you use enemas, laxatives, or suppositories. . To help with decreased appetite, eat small, frequent meals. Eat foods high in calories and protein, such as meat, poultry, fish, dry beans, tofu, eggs, nuts, milk, yogurt, cheese, ice cream, pudding, and nutritional supplements. Marland Kitchen  Consider using sauces and spices to increase taste. Daily exercise, with your doctor's approval, may increase your appetite. . To help with hair loss, wash with a mild shampoo and avoid washing your hair every day. . Avoid rubbing your scalp, pat your hair or scalp dry. . Avoid coloring your hair. . Limit your use of  hair spray, electric curlers, blow dryers, and curling irons. . If you are interested in getting a wig, talk to your nurse. You can also call the [facility] at 800-ACS-2345 to find out information about the "Look Good, Feel Better" program close to where you live. It is a free program where women getting chemotherapy can learn about wigs, turbans and scarves as well as makeup techniques and skin and nail care.  Food and Drug Interactions . There are no known interactions of fam-trastuzumab deruxtecan-nxki with food and other medications. . Check with your doctor or pharmacist about all other prescription medicines and dietary supplements you are taking before starting this medicine as there are known drug interactions with fam-trastuzumab deruxtecan-nxki. Also, check with your doctor or pharmacist before starting any new prescription or over-the-counter medicines, or dietary supplement to make sure that there are no interactions.  When to Call the Doctor Call your doctor or nurse if you have any of these symptoms and/or any new or unusual symptoms: . Fever of 100.4 F (38 C) or higher . Chills . Tiredness that interferes with your daily activities . Pain in your chest . Coughing up yellow, green, or bloody mucus . Wheezing or trouble breathing . Dry cough . Feeling dizzy or lightheaded . Easy bleeding or bruising . Nausea that stops you from eating or drinking and/or is not relieved by prescribed medicines . Throwing up more than 3 times a day . No bowel movement in 3 days or when you feel uncomfortable . Diarrhea, 4 times in one day or diarrhea with lack of strength or a feeling of being dizzy . Lasting loss of appetite or rapid weight loss of five pounds in a week . Swelling of arms, hands, legs and/or feet . Weight gain of 5 pounds in one week (fluid retention) . If you think you may be pregnant or may have impregnated your partner  Reproduction Warnings .  Pregnancy warning: This drug can have harmful effects on the unborn baby. Women of childbearing potential should use effective methods of birth control during your cancer treatment and for at least 7 months after treatment. Men with female partners of childbearing potential should use effective methods of birth control during your cancer treatment and for at least 4 months after your cancer treatment. Let your doctor know right away if you think you may be pregnant or may have impregnated your partner. . Breastfeeding warning: Women should not breast feed during treatment and for 7 months after treatment because this drug could enter the breast milk and cause harm to a breast feeding baby. . Fertility warning: In men, this drug may affect your ability to have children in the future. Talk with your doctor or nurse if you plan to have children. Ask for information on sperm banking.

## 2019-05-01 ENCOUNTER — Encounter (HOSPITAL_COMMUNITY): Payer: Self-pay | Admitting: Hematology

## 2019-05-01 ENCOUNTER — Inpatient Hospital Stay (HOSPITAL_COMMUNITY): Payer: Medicare Other

## 2019-05-01 ENCOUNTER — Other Ambulatory Visit: Payer: Self-pay

## 2019-05-01 ENCOUNTER — Inpatient Hospital Stay (HOSPITAL_COMMUNITY): Payer: Medicare Other | Attending: Hematology

## 2019-05-01 ENCOUNTER — Inpatient Hospital Stay (HOSPITAL_BASED_OUTPATIENT_CLINIC_OR_DEPARTMENT_OTHER): Payer: Medicare Other | Admitting: Hematology

## 2019-05-01 VITALS — BP 90/61 | HR 77 | Temp 97.8°F | Resp 16 | Wt 156.0 lb

## 2019-05-01 VITALS — BP 87/53 | HR 66 | Resp 16

## 2019-05-01 DIAGNOSIS — N644 Mastodynia: Secondary | ICD-10-CM | POA: Diagnosis not present

## 2019-05-01 DIAGNOSIS — C50912 Malignant neoplasm of unspecified site of left female breast: Secondary | ICD-10-CM

## 2019-05-01 DIAGNOSIS — C50812 Malignant neoplasm of overlapping sites of left female breast: Secondary | ICD-10-CM

## 2019-05-01 DIAGNOSIS — Z5112 Encounter for antineoplastic immunotherapy: Secondary | ICD-10-CM | POA: Diagnosis not present

## 2019-05-01 DIAGNOSIS — C7802 Secondary malignant neoplasm of left lung: Secondary | ICD-10-CM | POA: Insufficient documentation

## 2019-05-01 DIAGNOSIS — C773 Secondary and unspecified malignant neoplasm of axilla and upper limb lymph nodes: Secondary | ICD-10-CM | POA: Insufficient documentation

## 2019-05-01 DIAGNOSIS — Z171 Estrogen receptor negative status [ER-]: Secondary | ICD-10-CM | POA: Diagnosis not present

## 2019-05-01 DIAGNOSIS — C7801 Secondary malignant neoplasm of right lung: Secondary | ICD-10-CM | POA: Diagnosis not present

## 2019-05-01 DIAGNOSIS — Z5189 Encounter for other specified aftercare: Secondary | ICD-10-CM | POA: Insufficient documentation

## 2019-05-01 LAB — COMPREHENSIVE METABOLIC PANEL
ALT: 13 U/L (ref 0–44)
AST: 24 U/L (ref 15–41)
Albumin: 3.3 g/dL — ABNORMAL LOW (ref 3.5–5.0)
Alkaline Phosphatase: 80 U/L (ref 38–126)
Anion gap: 11 (ref 5–15)
BUN: 12 mg/dL (ref 8–23)
CO2: 26 mmol/L (ref 22–32)
Calcium: 9.2 mg/dL (ref 8.9–10.3)
Chloride: 103 mmol/L (ref 98–111)
Creatinine, Ser: 0.57 mg/dL (ref 0.44–1.00)
GFR calc Af Amer: >60 mL/min (ref >60)
GFR calc non Af Amer: >60 mL/min (ref >60)
Glucose, Bld: 97 mg/dL (ref 70–99)
Potassium: 4.3 mmol/L (ref 3.5–5.1)
Sodium: 140 mmol/L (ref 135–145)
Total Bilirubin: 0.3 mg/dL (ref 0.3–1.2)
Total Protein: 7.8 g/dL (ref 6.5–8.1)

## 2019-05-01 LAB — CBC WITH DIFFERENTIAL/PLATELET
Abs Immature Granulocytes: 0.03 10*3/uL (ref 0.00–0.07)
Basophils Absolute: 0 10*3/uL (ref 0.0–0.1)
Basophils Relative: 0 %
Eosinophils Absolute: 0.1 10*3/uL (ref 0.0–0.5)
Eosinophils Relative: 1 %
HCT: 36.9 % (ref 36.0–46.0)
Hemoglobin: 10.8 g/dL — ABNORMAL LOW (ref 12.0–15.0)
Immature Granulocytes: 0 %
Lymphocytes Relative: 11 %
Lymphs Abs: 1.2 10*3/uL (ref 0.7–4.0)
MCH: 25.3 pg — ABNORMAL LOW (ref 26.0–34.0)
MCHC: 29.3 g/dL — ABNORMAL LOW (ref 30.0–36.0)
MCV: 86.4 fL (ref 80.0–100.0)
Monocytes Absolute: 0.7 10*3/uL (ref 0.1–1.0)
Monocytes Relative: 6 %
Neutro Abs: 8.8 10*3/uL — ABNORMAL HIGH (ref 1.7–7.7)
Neutrophils Relative %: 82 %
Platelets: 368 10*3/uL (ref 150–400)
RBC: 4.27 MIL/uL (ref 3.87–5.11)
RDW: 14.4 % (ref 11.5–15.5)
WBC: 10.9 10*3/uL — ABNORMAL HIGH (ref 4.0–10.5)
nRBC: 0 % (ref 0.0–0.2)

## 2019-05-01 MED ORDER — ACETAMINOPHEN 325 MG PO TABS
650.0000 mg | ORAL_TABLET | Freq: Once | ORAL | Status: AC
  Administered 2019-05-01: 10:00:00 650 mg via ORAL
  Filled 2019-05-01: qty 2

## 2019-05-01 MED ORDER — SODIUM CHLORIDE 0.9 % IV SOLN
10.0000 mg | Freq: Once | INTRAVENOUS | Status: AC
  Administered 2019-05-01: 10 mg via INTRAVENOUS
  Filled 2019-05-01: qty 10

## 2019-05-01 MED ORDER — DEXTROSE 5 % IV SOLN
Freq: Once | INTRAVENOUS | Status: AC
  Administered 2019-05-01: 10:00:00 via INTRAVENOUS

## 2019-05-01 MED ORDER — FAM-TRASTUZUMAB DERUXTECAN-NXKI CHEMO 100 MG IV SOLR
5.4000 mg/kg | Freq: Once | INTRAVENOUS | Status: AC
  Administered 2019-05-01: 372 mg via INTRAVENOUS
  Filled 2019-05-01: qty 18.6

## 2019-05-01 MED ORDER — PALONOSETRON HCL INJECTION 0.25 MG/5ML
0.2500 mg | Freq: Once | INTRAVENOUS | Status: AC
  Administered 2019-05-01: 10:00:00 0.25 mg via INTRAVENOUS
  Filled 2019-05-01: qty 5

## 2019-05-01 MED ORDER — SODIUM CHLORIDE 0.9% FLUSH
10.0000 mL | INTRAVENOUS | Status: DC | PRN
  Administered 2019-05-01: 10:00:00 10 mL
  Filled 2019-05-01: qty 10

## 2019-05-01 MED ORDER — HEPARIN SOD (PORK) LOCK FLUSH 100 UNIT/ML IV SOLN
500.0000 [IU] | Freq: Once | INTRAVENOUS | Status: AC | PRN
  Administered 2019-05-01: 13:00:00 500 [IU]

## 2019-05-01 MED ORDER — DIPHENHYDRAMINE HCL 25 MG PO CAPS
50.0000 mg | ORAL_CAPSULE | Freq: Once | ORAL | Status: AC
  Administered 2019-05-01: 10:00:00 50 mg via ORAL
  Filled 2019-05-01: qty 2

## 2019-05-01 NOTE — Patient Instructions (Signed)
[place] at [facility] Discharge Instructions  You were seen today by Dr. Delton Coombes. He went over your recent lab results. Please let us know if you have any new cough or breathing issues. He will see you back in 1 week for labs and follow up.   Thank you for choosing [place] at [facility] to provide your oncology and hematology care.  To afford each patient quality time with our provider, please arrive at least 15 minutes before your scheduled appointment time.   If you have a lab appointment with the [facility] please come in thru the  Main Entrance and check in at the main information desk  You need to re-schedule your appointment should you arrive 10 or more minutes late.  We strive to give you quality time with our providers, and arriving late affects you and other patients whose appointments are after yours.  Also, if you no show three or more times for appointments you may be dismissed from the clinic at the providers discretion.     Again, thank you for choosing [facility].  Our hope is that these requests will decrease the amount of time that you wait before being seen by our physicians.       _____________________________________________________________  Should you have questions after your visit to [facility], please contact our office at [phone] between the hours of 8:00 a.m. and 4:30 p.m.  Voicemails left after 4:00 p.m. will not be returned until the following business day.  For prescription refill requests, have your pharmacy contact our office and allow 72 hours.    Cancer Center Support Programs:   > Cancer Support Group  2nd Tuesday of the month 1pm-2pm, Journey Room

## 2019-05-01 NOTE — Progress Notes (Signed)
Labs reviewed with MD today at office visit. Proceed today with day one cycle one of EnHertu per MD.  Treatment given per orders. Patient tolerated it well without problems. Vitals stable and discharged home from clinic ambulatory. Follow up as scheduled.

## 2019-05-01 NOTE — Progress Notes (Signed)
[facility] 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      BRIEF ONCOLOGIC HISTORY:  Oncology History  Malignant neoplasm of overlapping sites of left female breast ([place])  05/15/2018 Initial Diagnosis   Malignant neoplasm of overlapping sites of left female breast ([place])   06/05/2018 - 01/17/2019 Chemotherapy   The patient had pegfilgrastim-cbqv (UDENYCA) injection 6 mg, 6 mg, Subcutaneous, Once, 6 of 6 cycles Administration: 6 mg (06/07/2018), 6 mg (06/28/2018), 6 mg (07/19/2018), 6 mg (08/30/2018), 6 mg (09/20/2018), 6 mg (08/10/2018) trastuzumab (HERCEPTIN) 600 mg in sodium chloride 0.9 % 250 mL chemo infusion, 609 mg, Intravenous,  Once, 10 of 13 cycles Administration: 600 mg (06/05/2018), 450 mg (06/26/2018), 450 mg (07/17/2018), 450 mg (08/28/2018), 450 mg (09/18/2018), 450 mg (08/07/2018), 450 mg (10/19/2018), 450 mg (11/09/2018), 450 mg (12/05/2018), 450 mg (12/26/2018) DOCEtaxel (TAXOTERE) 140 mg in sodium chloride 0.9 % 250 mL chemo infusion, 75 mg/m2 = 140 mg, Intravenous,  Once, 6 of 6 cycles Administration: 140 mg (06/05/2018), 140 mg (06/26/2018), 140 mg (07/17/2018), 140 mg (08/28/2018), 140 mg (09/18/2018), 140 mg (08/07/2018) ondansetron (ZOFRAN) 8 mg, dexamethasone (DECADRON) 4 mg in sodium chloride 0.9 % 50 mL IVPB, , Intravenous,  Once, 6 of 6 cycles Administration:  (06/05/2018),  (06/26/2018),  (07/17/2018),  (08/28/2018),  (09/18/2018),  (08/07/2018) pertuzumab (PERJETA) 840 mg in sodium chloride 0.9 % 250 mL chemo infusion, 840 mg, Intravenous, Once, 10 of 13 cycles Administration: 840 mg (06/05/2018), 420 mg (06/26/2018), 420 mg (07/17/2018), 420 mg (08/28/2018), 420 mg (09/18/2018), 420 mg (08/07/2018), 420 mg (10/19/2018), 420 mg  (11/09/2018), 420 mg (12/05/2018), 420 mg (12/26/2018)  for chemotherapy treatment.    05/01/2019 -  Chemotherapy   The patient had palonosetron (ALOXI) injection 0.25 mg, 0.25 mg, Intravenous,  Once, 1 of 6 cycles fam-trastuzumab deruxtecan-nxki (ENHERTU) 372 mg in dextrose 5 % 100 mL chemo infusion, 5.4 mg/kg = 372 mg, Intravenous,  Once, 1 of 6 cycles  for chemotherapy treatment.    HER2-positive carcinoma of left breast ([place])  05/30/2018 Initial Diagnosis   HER2-positive carcinoma of left breast ([place])   01/26/2019 - 04/26/2019 Chemotherapy   The patient had ado-trastuzumab emtansine (KADCYLA) 260 mg in sodium chloride 0.9 % 250 mL chemo infusion, 240 mg, Intravenous, Once, 4 of 5 cycles Administration: 260 mg (01/26/2019), 260 mg (02/16/2019), 260 mg (03/09/2019), 260 mg (04/06/2019)  for chemotherapy treatment.    05/01/2019 -  Chemotherapy   The patient had palonosetron (ALOXI) injection 0.25 mg, 0.25 mg, Intravenous,  Once, 1 of 6 cycles fam-trastuzumab deruxtecan-nxki (ENHERTU) 372 mg in dextrose 5 % 100 mL chemo infusion, 5.4 mg/kg = 372 mg, Intravenous,  Once, 1 of 6 cycles  for chemotherapy treatment.       CANCER STAGING: Cancer Staging No matching staging information was found for the patient.   INTERVAL HISTORY:  Ms. Kruczek 63 y.o. female seen for follow-up of HER-2 positive metastatic left breast cancer.  Appetite is 100%.  Energy levels are 75%.  Patient is slightly drowsy and reported that she took pain medicine this morning.  Blood pressure is 90 systolic.  She reportedly noticed some change in her left breast skin.  Denies any nausea, vomiting, diarrhea or constipation.  She does not report any pain  in the left breast area at this time.  She does have headaches which are chronic.  She had MRI of the brain as well as echocardiogram done.   REVIEW OF SYSTEMS:  Review of Systems  All other systems reviewed and are negative.    PAST MEDICAL/SURGICAL HISTORY:  Past Medical  History:  Diagnosis Date  . Alzheimer's dementia ([place])   . Bradycardia   . Breast cancer ([place])   . Dementia ([place])   . Depression   . Hypokalemia   . Hypotension   . 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 ABDOMINAL HYSTERECTOMY    . BREAST BIOPSY Left   . PORTACATH PLACEMENT Right 05/15/2018   Procedure: INSERTION PORT-A-CATH;  Surgeon: Aviva Signs, MD;  Location: [facility];  Service: General;  Laterality: Right;     SOCIAL HISTORY:  Social History   Socioeconomic History  . Marital status: Widowed    Spouse name: Not on file  . Number of children: Not on file  . Years of education: Not on file  . Highest education level: Not on file  Occupational History  . Not on file  Social Needs  . Financial resource strain: Not on file  . Food insecurity    Worry: Not on file    Inability: Not on file  . Transportation needs    Medical: Not on file    Non-medical: Not on file  Tobacco Use  . Smoking status: Unknown If Ever Smoked  . Smokeless tobacco: Never Used  Substance and Sexual Activity  . Alcohol use: Not Currently  . Drug use: Never  . Sexual activity: Not Currently  Lifestyle  . Physical activity    Days per week: Not on file    Minutes per session: Not on file  . Stress: Not on file  Relationships  . Social Herbalist on phone: Not on file    Gets together: Not on file    Attends religious service: Not on file    Active member of club or organization: Not on file    Attends meetings of clubs or organizations: Not on file    Relationship status: Not on file  . Intimate partner violence    Fear of current or ex partner: Not on file    Emotionally abused: Not on file    Physically abused: Not on file    Forced sexual activity: Not on file  Other Topics Concern  . Not on file  Social History Narrative   Lives at [facility] in [place]:   History reviewed. No pertinent family history.  CURRENT MEDICATIONS:  Outpatient Encounter Medications as of 05/01/2019  Medication Sig  . busPIRone (BUSPAR) 5 MG tablet Take 5 mg by mouth 2 (two) times daily.  . divalproex (DEPAKOTE) 250 MG DR tablet Take 250 mg by mouth at bedtime.   . divalproex (DEPAKOTE) 250 MG DR tablet Take 250 mg by mouth 3 (three) times daily.  . divalproex (DEPAKOTE) 500 MG DR tablet Take 500 mg by mouth every morning.   . docusate sodium (COLACE) 100 MG capsule Take 200 mg by mouth daily. May increase to 2 capsules in the am and 2 capsules in the pm. If no BM, call the [facility].  . donepezil (ARICEPT) 10 MG tablet Take 10 mg by mouth at bedtime.   . fam-trastuzumab deruxtecan-nxki 5.4 mg/kg in  dextrose 5 % 100 mL Inject 5.4 mg/kg into the vein every 21 ( twenty-one) days.  . memantine (NAMENDA) 10 MG tablet Take 10 mg by mouth 2 (two) times daily.  . mirtazapine (REMERON) 7.5 MG tablet Take 7.5 mg by mouth at bedtime.  Marland Kitchen QUEtiapine (SEROQUEL) 25 MG tablet Take 25 mg by mouth 2 (two) times daily.   . sertraline (ZOLOFT) 100 MG tablet Take 100 mg by mouth daily.   Marland Kitchen VITAMIN D, CHOLECALCIFEROL, PO Take 2,000 Int'l Units by mouth daily.  Marland Kitchen acetaminophen (TYLENOL) 325 MG tablet Take 650 mg by mouth every 6 (six) hours as needed for mild pain.  Marland Kitchen ALPRAZolam (XANAX) 0.5 MG tablet Take 0.5 mg by mouth 3 (three) times daily as needed.   . betamethasone dipropionate (DIPROLENE) 0.05 % cream 2 (two) times daily. Apply to left breast two times a day for dermatitis use on days that resident doesn't go for chemotherapy.  Marland Kitchen HYDROcodone-acetaminophen (NORCO/VICODIN) 5-325 MG tablet Take 1 tablet by mouth every 6 (six) hours as needed.   . [DISCONTINUED] prochlorperazine (COMPAZINE) 10 MG tablet Take 1 tablet (10 mg total) by mouth every 6 (six) hours as needed (Nausea or vomiting).   No facility-administered encounter medications on file as of 05/01/2019.     ALLERGIES:  No  Known Allergies   PHYSICAL EXAM:  ECOG Performance status: 1  Vitals:   05/01/19 0851  BP: 90/61  Pulse: 77  Resp: 16  Temp: 97.8 F (36.6 C)  SpO2: 96%   Filed Weights   05/01/19 0851  Weight: 156 lb (70.8 kg)    Physical Exam Vitals signs reviewed.  Constitutional:      Appearance: Normal appearance.  Cardiovascular:     Rate and Rhythm: Normal rate and regular rhythm.     Heart sounds: Normal heart sounds.  Pulmonary:     Effort: Pulmonary effort is normal.     Breath sounds: Normal breath sounds.  Abdominal:     General: There is no distension.     Palpations: Abdomen is soft. There is no mass.  Musculoskeletal:        General: No swelling.  Skin:    General: Skin is warm.  Neurological:     General: No focal deficit present.     Mental Status: She is alert and oriented to person, place, and time.  Psychiatric:        Mood and Affect: Mood normal.        Behavior: Behavior normal.      LABORATORY DATA:  I have reviewed the labs as listed.  CBC    Component Value Date/Time   WBC 10.9 (H) 05/01/2019 0858   RBC 4.27 05/01/2019 0858   HGB 10.8 (L) 05/01/2019 0858   HCT 36.9 05/01/2019 0858   PLT 368 05/01/2019 0858   MCV 86.4 05/01/2019 0858   MCH 25.3 (L) 05/01/2019 0858   MCHC 29.3 (L) 05/01/2019 0858   RDW 14.4 05/01/2019 0858   LYMPHSABS 1.2 05/01/2019 0858   MONOABS 0.7 05/01/2019 0858   EOSABS 0.1 05/01/2019 0858   BASOSABS 0.0 05/01/2019 0858   CMP Latest Ref Rng & Units 05/01/2019 04/10/2019 04/06/2019  Glucose 70 - 99 mg/dL 97 112(H) 150(H)  BUN 8 - 23 mg/dL '12 15 12  ' Creatinine 0.44 - 1.00 mg/dL 0.57 0.62 0.58  Sodium 135 - 145 mmol/L 140 139 138  Potassium 3.5 - 5.1 mmol/L 4.3 3.8 3.9  Chloride 98 - 111 mmol/L 103 100 101  CO2 22 - 32 mmol/L '26 28 25  ' Calcium 8.9 - 10.3 mg/dL 9.2 9.6 9.1  Total Protein 6.5 - 8.1 g/dL 7.8 8.7(H) 7.8  Total Bilirubin 0.3 - 1.2 mg/dL 0.3 0.5 0.4  Alkaline Phos 38 - 126 U/L 80 90 85  AST 15 - 41 U/L 24  34 23  ALT 0 - 44 U/L '13 12 14       ' DIAGNOSTIC IMAGING:  I have independently reviewed the scans and discussed with the patient.   I have reviewed Venita Lick LPN's note and agree with the documentation.  I personally performed a face-to-face visit, made revisions and my assessment and plan is as follows.    ASSESSMENT & PLAN:   HER2-positive carcinoma of left breast ([place]) 1.  Metastatic HER-2 positive left breast cancer to the lungs: -Biopsy of the left breast and left axillary lymph node shows invasive mammary carcinoma, ER PR negative, Ki-67 50%, HER-2 positive by FISH. -6 cycles of THP from 06/05/2018-09/18/2018 with maintenance Herceptin and Pertuzumab continued until 12/26/2018 with progression. -4 cycles of Kadcyla from 01/26/2019-04/06/2019 with progression. - PET scan on 04/09/2019 showed 6.7 x 5.9 cm left breast mass, left axillary lymph node, left upper lobe lung mass, right lower lobe lung mass, all have increased.  No bone metastasis. - Echocardiogram on 04/24/2019 shows EF of 60-65%.  MRI of the brain was negative. -Physical exam today shows invasion of her malignancy into the skin of the left breast above her nipple, slightly fluctuant. - We reviewed her labs.  She will proceed with her first dose of Herceptin deruxtecan.  Because of high risk of neutropenia, I will add prophylactic G-CSF. - We also discussed side effects including rare chance of interstitial lung disease in 15% of the patients and neutropenia and 20 to 30% of patients. -I will see her back in 1 week to see how she is tolerating it.  2.  Left breast pain: - She is taking hydrocodone 5 mg twice daily.  She is slightly drowsy today.  We will closely monitor.  Total time spent is 40 minutes with more than 50% of the time spent face-to-face discussing scan results, change in treatment plan, counseling and coordination of care.    Orders placed this encounter:  Orders Placed This Encounter  Procedures  . CBC  with Differential  . Comprehensive metabolic panel  . PHYSICIAN COMMUNICATION ORDER      Derek Jack, [place] [phone]

## 2019-05-01 NOTE — Assessment & Plan Note (Signed)
1.  Metastatic HER-2 positive left breast cancer to the lungs: -Biopsy of the left breast and left axillary lymph node shows invasive mammary carcinoma, ER PR negative, Ki-67 50%, HER-2 positive by FISH. -6 cycles of THP from 06/05/2018-09/18/2018 with maintenance Herceptin and Pertuzumab continued until 12/26/2018 with progression. -4 cycles of Kadcyla from 01/26/2019-04/06/2019 with progression. - PET scan on 04/09/2019 showed 6.7 x 5.9 cm left breast mass, left axillary lymph node, left upper lobe lung mass, right lower lobe lung mass, all have increased.  No bone metastasis. - Echocardiogram on 04/24/2019 shows EF of 60-65%.  MRI of the brain was negative. -Physical exam today shows invasion of her malignancy into the skin of the left breast above her nipple, slightly fluctuant. - We reviewed her labs.  She will proceed with her first dose of Herceptin deruxtecan.  Because of high risk of neutropenia, I will add prophylactic G-CSF. - We also discussed side effects including rare chance of interstitial lung disease in 15% of the patients and neutropenia and 20 to 30% of patients. -I will see her back in 1 week to see how she is tolerating it.  2.  Left breast pain: - She is taking hydrocodone 5 mg twice daily.  She is slightly drowsy today.  We will closely monitor.

## 2019-05-03 ENCOUNTER — Encounter (HOSPITAL_COMMUNITY): Payer: Self-pay

## 2019-05-03 ENCOUNTER — Inpatient Hospital Stay (HOSPITAL_COMMUNITY): Payer: Medicare Other

## 2019-05-03 ENCOUNTER — Other Ambulatory Visit: Payer: Self-pay

## 2019-05-03 VITALS — BP 121/62 | HR 75 | Temp 97.7°F | Resp 18

## 2019-05-03 DIAGNOSIS — C7801 Secondary malignant neoplasm of right lung: Secondary | ICD-10-CM | POA: Diagnosis not present

## 2019-05-03 DIAGNOSIS — N644 Mastodynia: Secondary | ICD-10-CM | POA: Diagnosis not present

## 2019-05-03 DIAGNOSIS — C7802 Secondary malignant neoplasm of left lung: Secondary | ICD-10-CM | POA: Diagnosis not present

## 2019-05-03 DIAGNOSIS — E639 Nutritional deficiency, unspecified: Secondary | ICD-10-CM | POA: Diagnosis not present

## 2019-05-03 DIAGNOSIS — M6281 Muscle weakness (generalized): Secondary | ICD-10-CM | POA: Diagnosis not present

## 2019-05-03 DIAGNOSIS — C50912 Malignant neoplasm of unspecified site of left female breast: Secondary | ICD-10-CM

## 2019-05-03 DIAGNOSIS — I10 Essential (primary) hypertension: Secondary | ICD-10-CM | POA: Diagnosis not present

## 2019-05-03 DIAGNOSIS — C50812 Malignant neoplasm of overlapping sites of left female breast: Secondary | ICD-10-CM | POA: Diagnosis not present

## 2019-05-03 DIAGNOSIS — C773 Secondary and unspecified malignant neoplasm of axilla and upper limb lymph nodes: Secondary | ICD-10-CM | POA: Diagnosis not present

## 2019-05-03 DIAGNOSIS — Z5112 Encounter for antineoplastic immunotherapy: Secondary | ICD-10-CM | POA: Diagnosis not present

## 2019-05-03 DIAGNOSIS — E559 Vitamin D deficiency, unspecified: Secondary | ICD-10-CM | POA: Diagnosis not present

## 2019-05-03 MED ORDER — PEGFILGRASTIM-JMDB 6 MG/0.6ML ~~LOC~~ SOSY
6.0000 mg | PREFILLED_SYRINGE | Freq: Once | SUBCUTANEOUS | Status: AC
  Administered 2019-05-03: 6 mg via SUBCUTANEOUS
  Filled 2019-05-03: qty 0.6

## 2019-05-03 NOTE — Progress Notes (Signed)
[name] [name] presents today for injection per MD orders. Fullphila administered SQ in left Upper Arm. Administration without incident. Patient tolerated well.  Vital signs stable. No complaints at this time. Discharged from clinic ambulatory. F/U with [facility] as scheduled.

## 2019-05-03 NOTE — Patient Instructions (Signed)
[facility]  Discharge Instructions:   _______________________________________________________________  Thank you for choosing [facility] at [facility] to provide your oncology and hematology care.  To afford each patient quality time with our providers, please arrive at least 15 minutes before your scheduled appointment.  You need to re-schedule your appointment if you arrive 10 or more minutes late.  We strive to give you quality time with our providers, and arriving late affects you and other patients whose appointments are after yours.  Also, if you no show three or more times for appointments you may be dismissed from the clinic.  Again, thank you for choosing  [facility] Center at [facility]. Our hope is that these requests will allow you access to exceptional care and in a timely manner. _______________________________________________________________  If you have questions after your visit, please contact our office at [phone] between the hours of 8:30 a.m. and 5:00 p.m. Voicemails left after 4:30 p.m. will not be returned until the following business day. _______________________________________________________________  For prescription refill requests, have your pharmacy contact our office. _______________________________________________________________  Recommendations made by the consultant and any test results will be sent to your referring physician. _______________________________________________________________ 

## 2019-05-08 ENCOUNTER — Inpatient Hospital Stay (HOSPITAL_BASED_OUTPATIENT_CLINIC_OR_DEPARTMENT_OTHER): Payer: Medicare Other | Admitting: Hematology

## 2019-05-08 ENCOUNTER — Inpatient Hospital Stay (HOSPITAL_COMMUNITY): Payer: Medicare Other

## 2019-05-08 ENCOUNTER — Other Ambulatory Visit (HOSPITAL_COMMUNITY): Payer: Medicare Other

## 2019-05-08 ENCOUNTER — Other Ambulatory Visit: Payer: Self-pay

## 2019-05-08 ENCOUNTER — Encounter (HOSPITAL_COMMUNITY): Payer: Self-pay | Admitting: Hematology

## 2019-05-08 DIAGNOSIS — C7802 Secondary malignant neoplasm of left lung: Secondary | ICD-10-CM | POA: Diagnosis not present

## 2019-05-08 DIAGNOSIS — K644 Residual hemorrhoidal skin tags: Secondary | ICD-10-CM | POA: Diagnosis not present

## 2019-05-08 DIAGNOSIS — C773 Secondary and unspecified malignant neoplasm of axilla and upper limb lymph nodes: Secondary | ICD-10-CM | POA: Diagnosis not present

## 2019-05-08 DIAGNOSIS — Z20828 Contact with and (suspected) exposure to other viral communicable diseases: Secondary | ICD-10-CM | POA: Diagnosis not present

## 2019-05-08 DIAGNOSIS — C50912 Malignant neoplasm of unspecified site of left female breast: Secondary | ICD-10-CM

## 2019-05-08 DIAGNOSIS — Z171 Estrogen receptor negative status [ER-]: Secondary | ICD-10-CM

## 2019-05-08 DIAGNOSIS — C50812 Malignant neoplasm of overlapping sites of left female breast: Secondary | ICD-10-CM | POA: Diagnosis not present

## 2019-05-08 DIAGNOSIS — C7801 Secondary malignant neoplasm of right lung: Secondary | ICD-10-CM | POA: Diagnosis not present

## 2019-05-08 DIAGNOSIS — N644 Mastodynia: Secondary | ICD-10-CM | POA: Diagnosis not present

## 2019-05-08 DIAGNOSIS — Z5112 Encounter for antineoplastic immunotherapy: Secondary | ICD-10-CM | POA: Diagnosis not present

## 2019-05-08 LAB — COMPREHENSIVE METABOLIC PANEL
ALT: 18 U/L (ref 0–44)
AST: 27 U/L (ref 15–41)
Albumin: 3.3 g/dL — ABNORMAL LOW (ref 3.5–5.0)
Alkaline Phosphatase: 162 U/L — ABNORMAL HIGH (ref 38–126)
Anion gap: 13 (ref 5–15)
BUN: 13 mg/dL (ref 8–23)
CO2: 25 mmol/L (ref 22–32)
Calcium: 9.2 mg/dL (ref 8.9–10.3)
Chloride: 101 mmol/L (ref 98–111)
Creatinine, Ser: 0.79 mg/dL (ref 0.44–1.00)
GFR calc Af Amer: >60 mL/min (ref >60)
GFR calc non Af Amer: >60 mL/min (ref >60)
Glucose, Bld: 100 mg/dL — ABNORMAL HIGH (ref 70–99)
Potassium: 4 mmol/L (ref 3.5–5.1)
Sodium: 139 mmol/L (ref 135–145)
Total Bilirubin: 0.6 mg/dL (ref 0.3–1.2)
Total Protein: 7.8 g/dL (ref 6.5–8.1)

## 2019-05-08 LAB — CBC WITH DIFFERENTIAL/PLATELET
Abs Immature Granulocytes: 0.66 10*3/uL — ABNORMAL HIGH (ref 0.00–0.07)
Basophils Absolute: 0.1 10*3/uL (ref 0.0–0.1)
Basophils Relative: 1 %
Eosinophils Absolute: 0.3 10*3/uL (ref 0.0–0.5)
Eosinophils Relative: 2 %
HCT: 39 % (ref 36.0–46.0)
Hemoglobin: 11.5 g/dL — ABNORMAL LOW (ref 12.0–15.0)
Immature Granulocytes: 4 %
Lymphocytes Relative: 12 %
Lymphs Abs: 2.2 10*3/uL (ref 0.7–4.0)
MCH: 25.6 pg — ABNORMAL LOW (ref 26.0–34.0)
MCHC: 29.5 g/dL — ABNORMAL LOW (ref 30.0–36.0)
MCV: 86.9 fL (ref 80.0–100.0)
Monocytes Absolute: 1 10*3/uL (ref 0.1–1.0)
Monocytes Relative: 5 %
Neutro Abs: 13.9 10*3/uL — ABNORMAL HIGH (ref 1.7–7.7)
Neutrophils Relative %: 76 %
Platelets: 265 10*3/uL (ref 150–400)
RBC: 4.49 MIL/uL (ref 3.87–5.11)
RDW: 14.6 % (ref 11.5–15.5)
WBC: 18.2 10*3/uL — ABNORMAL HIGH (ref 4.0–10.5)
nRBC: 0 % (ref 0.0–0.2)

## 2019-05-08 NOTE — Patient Instructions (Addendum)
[facility] at [facility] Discharge Instructions  You were seen today by Dr. Katragadda. He went over your recent lab results. He will see you back in 2 weeks for labs, treatment and follow up.   Thank you for choosing [facility] at [facility] to provide your oncology and hematology care.  To afford each patient quality time with our provider, please arrive at least 15 minutes before your scheduled appointment time.   If you have a lab appointment with the Cancer Center please come in thru the  Main Entrance and check in at the main information desk  You need to re-schedule your appointment should you arrive 10 or more minutes late.  We strive to give you quality time with our providers, and arriving late affects you and other patients whose appointments are after yours.  Also, if you no show three or more times for appointments you may be dismissed from the clinic at the providers discretion.     Again, thank you for choosing [facility].  Our hope is that these requests will decrease the amount of time that you wait before being seen by our physicians.       _____________________________________________________________  Should you have questions after your visit to [facility], please contact our office at [phone] between the hours of 8:00 a.m. and 4:30 p.m.  Voicemails left after 4:00 p.m. will not be returned until the following business day.  For prescription refill requests, have your pharmacy contact our office and allow 72 hours.    Cancer Center Support Programs:   > Cancer Support Group  2nd Tuesday of the month 1pm-2pm, Journey Room    

## 2019-05-08 NOTE — Progress Notes (Signed)
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      BRIEF ONCOLOGIC HISTORY:  Oncology History  Malignant neoplasm of overlapping sites of left female breast ([place])  05/15/2018 Initial Diagnosis   Malignant neoplasm of overlapping sites of left female breast ([place])   06/05/2018 - 01/17/2019 Chemotherapy   The patient had pegfilgrastim-cbqv (UDENYCA) injection 6 mg, 6 mg, Subcutaneous, Once, 6 of 6 cycles Administration: 6 mg (06/07/2018), 6 mg (06/28/2018), 6 mg (07/19/2018), 6 mg (08/30/2018), 6 mg (09/20/2018), 6 mg (08/10/2018) trastuzumab (HERCEPTIN) 600 mg in sodium chloride 0.9 % 250 mL chemo infusion, 609 mg, Intravenous,  Once, 10 of 13 cycles Administration: 600 mg (06/05/2018), 450 mg (06/26/2018), 450 mg (07/17/2018), 450 mg (08/28/2018), 450 mg (09/18/2018), 450 mg (08/07/2018), 450 mg (10/19/2018), 450 mg (11/09/2018), 450 mg (12/05/2018), 450 mg (12/26/2018) DOCEtaxel (TAXOTERE) 140 mg in sodium chloride 0.9 % 250 mL chemo infusion, 75 mg/m2 = 140 mg, Intravenous,  Once, 6 of 6 cycles Administration: 140 mg (06/05/2018), 140 mg (06/26/2018), 140 mg (07/17/2018), 140 mg (08/28/2018), 140 mg (09/18/2018), 140 mg (08/07/2018) ondansetron (ZOFRAN) 8 mg, dexamethasone (DECADRON) 4 mg in sodium chloride 0.9 % 50 mL IVPB, , Intravenous,  Once, 6 of 6 cycles Administration:  (06/05/2018),  (06/26/2018),  (07/17/2018),  (08/28/2018),  (09/18/2018),  (08/07/2018) pertuzumab (PERJETA) 840 mg in sodium chloride 0.9 % 250 mL chemo infusion, 840 mg, Intravenous, Once, 10 of 13 cycles Administration: 840 mg (06/05/2018), 420 mg (06/26/2018), 420 mg (07/17/2018), 420 mg (08/28/2018), 420 mg (09/18/2018), 420 mg (08/07/2018), 420 mg (10/19/2018), 420 mg  (11/09/2018), 420 mg (12/05/2018), 420 mg (12/26/2018)  for chemotherapy treatment.    05/01/2019 -  Chemotherapy   The patient had palonosetron (ALOXI) injection 0.25 mg, 0.25 mg, Intravenous,  Once, 1 of 6 cycles Administration: 0.25 mg (05/01/2019) pegfilgrastim-jmdb (FULPHILA) injection 6 mg, 6 mg, Subcutaneous,  Once, 1 of 6 cycles Administration: 6 mg (05/03/2019) fam-trastuzumab deruxtecan-nxki (ENHERTU) 372 mg in dextrose 5 % 100 mL chemo infusion, 5.4 mg/kg = 372 mg, Intravenous,  Once, 1 of 6 cycles Administration: 372 mg (05/01/2019)  for chemotherapy treatment.    HER2-positive carcinoma of left breast ([place])  05/30/2018 Initial Diagnosis   HER2-positive carcinoma of left breast ([place])   01/26/2019 - 04/26/2019 Chemotherapy   The patient had ado-trastuzumab emtansine (KADCYLA) 260 mg in sodium chloride 0.9 % 250 mL chemo infusion, 240 mg, Intravenous, Once, 4 of 5 cycles Administration: 260 mg (01/26/2019), 260 mg (02/16/2019), 260 mg (03/09/2019), 260 mg (04/06/2019)  for chemotherapy treatment.    05/01/2019 -  Chemotherapy   The patient had palonosetron (ALOXI) injection 0.25 mg, 0.25 mg, Intravenous,  Once, 1 of 6 cycles Administration: 0.25 mg (05/01/2019) pegfilgrastim-jmdb (FULPHILA) injection 6 mg, 6 mg, Subcutaneous,  Once, 1 of 6 cycles Administration: 6 mg (05/03/2019) fam-trastuzumab deruxtecan-nxki (ENHERTU) 372 mg in dextrose 5 % 100 mL chemo infusion, 5.4 mg/kg = 372 mg, Intravenous,  Once, 1 of 6 cycles Administration: 372 mg (05/01/2019)  for chemotherapy treatment.       CANCER STAGING: Cancer Staging No matching staging information was found for the patient.   INTERVAL HISTORY:  Ms. Lien 63 y.o. female seen for follow-up of metastatic HER-2 positive left breast cancer to the lungs.  She started on new chemotherapy with trastuzumab deruxtecan on 05/01/2019.  No cough or shortness of breath on exertion noted.  Denied any nausea vomiting diarrhea or constipation.  Appetite is 50%.   Energy levels are low.  Pain in the left breast has improved.  No fevers or chills noted.  REVIEW OF SYSTEMS:  Review of Systems  All other systems reviewed and are negative.    PAST MEDICAL/SURGICAL HISTORY:  Past Medical History:  Diagnosis Date  . Alzheimer's dementia ([place])   . Bradycardia   . Breast cancer ([place])   . Dementia ([place])   . Depression   . Hypokalemia   . Hypotension   . 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 ABDOMINAL HYSTERECTOMY    . BREAST BIOPSY Left   . PORTACATH PLACEMENT Right 05/15/2018   Procedure: INSERTION PORT-A-CATH;  Surgeon: Aviva Signs, MD;  Location: [facility];  Service: General;  Laterality: Right;     SOCIAL HISTORY:  Social History   Socioeconomic History  . Marital status: Widowed    Spouse name: Not on file  . Number of children: Not on file  . Years of education: Not on file  . Highest education level: Not on file  Occupational History  . Not on file  Social Needs  . Financial resource strain: Not on file  . Food insecurity    Worry: Not on file    Inability: Not on file  . Transportation needs    Medical: Not on file    Non-medical: Not on file  Tobacco Use  . Smoking status: Unknown If Ever Smoked  . Smokeless tobacco: Never Used  Substance and Sexual Activity  . Alcohol use: Not Currently  . Drug use: Never  . Sexual activity: Not Currently  Lifestyle  . Physical activity    Days per week: Not on file    Minutes per session: Not on file  . Stress: Not on file  Relationships  . Social Herbalist on phone: Not on file    Gets together: Not on file    Attends religious service: Not on file    Active member of club or organization: Not on file    Attends meetings of clubs or organizations: Not on file    Relationship status: Not on file  . Intimate partner violence    Fear of current or ex partner: Not on file     Emotionally abused: Not on file    Physically abused: Not on file    Forced sexual activity: Not on file  Other Topics Concern  . Not on file  Social History Narrative   Lives at [facility] in [place]:  History reviewed. No pertinent family history.  CURRENT MEDICATIONS:  Outpatient Encounter Medications as of 05/08/2019  Medication Sig  . ALPRAZolam (XANAX) 0.5 MG tablet Take 0.5 mg by mouth 3 (three) times daily as needed.   . betamethasone dipropionate (DIPROLENE) 0.05 % cream 2 (two) times daily. Apply to left breast two times a day for dermatitis use on days that resident doesn't go for chemotherapy.  . busPIRone (BUSPAR) 5 MG tablet Take 5 mg by mouth 2 (two) times daily.  . divalproex (DEPAKOTE) 250 MG DR tablet Take 250 mg by mouth at bedtime.   . divalproex (DEPAKOTE) 500 MG DR tablet Take 500 mg by mouth every morning.   Marland Kitchen  donepezil (ARICEPT) 10 MG tablet Take 10 mg by mouth at bedtime.   . fam-trastuzumab deruxtecan-nxki 5.4 mg/kg in dextrose 5 % 100 mL Inject 5.4 mg/kg into the vein every 21 ( twenty-one) days.  Marland Kitchen HYDROcodone-acetaminophen (NORCO/VICODIN) 5-325 MG tablet Take 1 tablet by mouth every 6 (six) hours as needed.   . memantine (NAMENDA) 10 MG tablet Take 10 mg by mouth 2 (two) times daily.  . mirtazapine (REMERON) 7.5 MG tablet Take 7.5 mg by mouth at bedtime.  Marland Kitchen QUEtiapine (SEROQUEL) 25 MG tablet Take 25 mg by mouth 2 (two) times daily.   . sertraline (ZOLOFT) 100 MG tablet Take 100 mg by mouth daily.   Marland Kitchen VITAMIN D, CHOLECALCIFEROL, PO Take 2,000 Int'l Units by mouth daily.  . [DISCONTINUED] divalproex (DEPAKOTE) 250 MG DR tablet Take 250 mg by mouth 3 (three) times daily.  Marland Kitchen acetaminophen (TYLENOL) 325 MG tablet Take 650 mg by mouth every 6 (six) hours as needed for mild pain.  Marland Kitchen docusate sodium (COLACE) 100 MG capsule Take 200 mg by mouth daily. May increase to 2 capsules in the am and 2 capsules in the pm. If no BM, call the [facility].  . ondansetron (ZOFRAN) 4 MG tablet Take 4 mg by mouth every 8 (eight) hours as needed for nausea or vomiting.  . [DISCONTINUED] prochlorperazine (COMPAZINE) 10 MG tablet Take 1 tablet (10 mg total) by mouth every 6 (six) hours as needed (Nausea or vomiting).   No facility-administered encounter medications on file as of 05/08/2019.     ALLERGIES:  No Known Allergies   PHYSICAL EXAM:  ECOG Performance status: 1  Vitals:   05/08/19 1010  BP: 108/79  Pulse: 69  Resp: 16  Temp: (!) 97.5 F (36.4 C)  SpO2: 96%   Filed Weights   05/08/19 1010  Weight: 153 lb 14.4 oz (69.8 kg)    Physical Exam Vitals signs reviewed.  Constitutional:      Appearance: Normal appearance.  Cardiovascular:     Rate and Rhythm: Normal rate and regular rhythm.     Heart sounds: Normal heart sounds.  Pulmonary:     Effort: Pulmonary effort is normal.     Breath sounds: Normal breath sounds.  Abdominal:     General: There is no distension.     Palpations: Abdomen is soft. There is no mass.  Musculoskeletal:        General: No swelling.  Skin:    General: Skin is warm.  Neurological:     General: No focal deficit present.     Mental Status: She is alert and oriented to person, place, and time.  Psychiatric:        Mood and Affect: Mood normal.        Behavior: Behavior normal.    Left breast erythema has improved.  Mass is stable.  LABORATORY DATA:  I have reviewed the labs as listed.  CBC    Component Value Date/Time   WBC 18.2 (H) 05/08/2019 0942   RBC 4.49 05/08/2019 0942   HGB 11.5 (L) 05/08/2019 0942   HCT 39.0 05/08/2019 0942   PLT 265 05/08/2019 0942   MCV 86.9 05/08/2019 0942   MCH 25.6 (L) 05/08/2019 0942   MCHC 29.5 (L) 05/08/2019 0942   RDW 14.6 05/08/2019 0942   LYMPHSABS 2.2 05/08/2019 0942   MONOABS 1.0 05/08/2019 0942   EOSABS 0.3 05/08/2019 0942   BASOSABS 0.1 05/08/2019 0942   CMP Latest Ref Rng & Units 05/08/2019 05/01/2019  04/10/2019  Glucose 70 - 99  mg/dL 100(H) 97 112(H)  BUN 8 - 23 mg/dL '13 12 15  ' Creatinine 0.44 - 1.00 mg/dL 0.79 0.57 0.62  Sodium 135 - 145 mmol/L 139 140 139  Potassium 3.5 - 5.1 mmol/L 4.0 4.3 3.8  Chloride 98 - 111 mmol/L 101 103 100  CO2 22 - 32 mmol/L '25 26 28  ' Calcium 8.9 - 10.3 mg/dL 9.2 9.2 9.6  Total Protein 6.5 - 8.1 g/dL 7.8 7.8 8.7(H)  Total Bilirubin 0.3 - 1.2 mg/dL 0.6 0.3 0.5  Alkaline Phos 38 - 126 U/L 162(H) 80 90  AST 15 - 41 U/L 27 24 34  ALT 0 - 44 U/L '18 13 12       ' DIAGNOSTIC IMAGING:  I have independently reviewed the scans and discussed with the patient.   I have reviewed Venita Lick LPN's note and agree with the documentation.  I personally performed a face-to-face visit, made revisions and my assessment and plan is as follows.    ASSESSMENT & PLAN:   HER2-positive carcinoma of left breast ([place]) 1.  Metastatic HER-2 positive left breast cancer to the lungs: -Biopsy of the left breast and left axillary lymph node shows invasive mammary carcinoma, ER PR negative, Ki-67 50%, HER-2 positive by FISH. -6 cycles of THP from 06/05/2018-09/18/2018 with maintenance Herceptin and Pertuzumab continued until 12/26/2018 with progression. -4 cycles of Kadcyla from 01/26/2019-04/06/2019 with progression. - PET scan on 04/09/2019 showed 6.7 x 5.9 cm left breast mass, left axillary lymph node, left upper lobe lung mass, right lower lobe lung mass, all have increased.  No bone metastasis. - Echocardiogram on 04/24/2019 shows EF of 60-65%.  MRI of the brain was negative. - Cycle 1 of trastuzumab Deruxtecan on 05/01/2019. -He denies any difficulty breathing.  No dry cough reported.  Denied any nausea or vomiting. -Physical exam today shows decreased erythema of the breast.  We have reviewed her labs. - She will be seen back in 2 weeks for her next cycle.  2.  Left breast pain: - She is currently on hydrocodone 10 mg twice daily. -She reported improvement in her pain.  I will change her medication to as  needed.  Total time spent is 25 minutes with more than 50% of the time spent face-to-face discussing treatment plan, counseling and coordination of care.    Orders placed this encounter:  No orders of the defined types were placed in this encounter.     Derek Jack, MD [place] [phone]

## 2019-05-08 NOTE — Assessment & Plan Note (Signed)
1.  Metastatic HER-2 positive left breast cancer to the lungs: -Biopsy of the left breast and left axillary lymph node shows invasive mammary carcinoma, ER PR negative, Ki-67 50%, HER-2 positive by FISH. -6 cycles of THP from 06/05/2018-09/18/2018 with maintenance Herceptin and Pertuzumab continued until 12/26/2018 with progression. -4 cycles of Kadcyla from 01/26/2019-04/06/2019 with progression. - PET scan on 04/09/2019 showed 6.7 x 5.9 cm left breast mass, left axillary lymph node, left upper lobe lung mass, right lower lobe lung mass, all have increased.  No bone metastasis. - Echocardiogram on 04/24/2019 shows EF of 60-65%.  MRI of the brain was negative. - Cycle 1 of trastuzumab Deruxtecan on 05/01/2019. -He denies any difficulty breathing.  No dry cough reported.  Denied any nausea or vomiting. -Physical exam today shows decreased erythema of the breast.  We have reviewed her labs. - She will be seen back in 2 weeks for her next cycle.  2.  Left breast pain: - She is currently on hydrocodone 10 mg twice daily. -She reported improvement in her pain.  I will change her medication to as needed.

## 2019-05-11 DIAGNOSIS — F320 Major depressive disorder, single episode, mild: Secondary | ICD-10-CM | POA: Diagnosis not present

## 2019-05-11 DIAGNOSIS — G309 Alzheimer's disease, unspecified: Secondary | ICD-10-CM | POA: Diagnosis not present

## 2019-05-14 DIAGNOSIS — Z20828 Contact with and (suspected) exposure to other viral communicable diseases: Secondary | ICD-10-CM | POA: Diagnosis not present

## 2019-05-15 DIAGNOSIS — R410 Disorientation, unspecified: Secondary | ICD-10-CM | POA: Diagnosis not present

## 2019-05-15 DIAGNOSIS — Z741 Need for assistance with personal care: Secondary | ICD-10-CM | POA: Diagnosis not present

## 2019-05-15 DIAGNOSIS — F0390 Unspecified dementia without behavioral disturbance: Secondary | ICD-10-CM | POA: Diagnosis not present

## 2019-05-16 DIAGNOSIS — F0390 Unspecified dementia without behavioral disturbance: Secondary | ICD-10-CM | POA: Diagnosis not present

## 2019-05-16 DIAGNOSIS — R410 Disorientation, unspecified: Secondary | ICD-10-CM | POA: Diagnosis not present

## 2019-05-16 DIAGNOSIS — Z741 Need for assistance with personal care: Secondary | ICD-10-CM | POA: Diagnosis not present

## 2019-05-17 DIAGNOSIS — Z741 Need for assistance with personal care: Secondary | ICD-10-CM | POA: Diagnosis not present

## 2019-05-17 DIAGNOSIS — F0390 Unspecified dementia without behavioral disturbance: Secondary | ICD-10-CM | POA: Diagnosis not present

## 2019-05-17 DIAGNOSIS — R410 Disorientation, unspecified: Secondary | ICD-10-CM | POA: Diagnosis not present

## 2019-05-18 DIAGNOSIS — F0390 Unspecified dementia without behavioral disturbance: Secondary | ICD-10-CM | POA: Diagnosis not present

## 2019-05-18 DIAGNOSIS — R410 Disorientation, unspecified: Secondary | ICD-10-CM | POA: Diagnosis not present

## 2019-05-18 DIAGNOSIS — Z741 Need for assistance with personal care: Secondary | ICD-10-CM | POA: Diagnosis not present

## 2019-05-19 DIAGNOSIS — R410 Disorientation, unspecified: Secondary | ICD-10-CM | POA: Diagnosis not present

## 2019-05-19 DIAGNOSIS — Z741 Need for assistance with personal care: Secondary | ICD-10-CM | POA: Diagnosis not present

## 2019-05-19 DIAGNOSIS — F0390 Unspecified dementia without behavioral disturbance: Secondary | ICD-10-CM | POA: Diagnosis not present

## 2019-05-20 DIAGNOSIS — Z741 Need for assistance with personal care: Secondary | ICD-10-CM | POA: Diagnosis not present

## 2019-05-20 DIAGNOSIS — F0390 Unspecified dementia without behavioral disturbance: Secondary | ICD-10-CM | POA: Diagnosis not present

## 2019-05-20 DIAGNOSIS — R410 Disorientation, unspecified: Secondary | ICD-10-CM | POA: Diagnosis not present

## 2019-05-21 DIAGNOSIS — R410 Disorientation, unspecified: Secondary | ICD-10-CM | POA: Diagnosis not present

## 2019-05-21 DIAGNOSIS — F0390 Unspecified dementia without behavioral disturbance: Secondary | ICD-10-CM | POA: Diagnosis not present

## 2019-05-21 DIAGNOSIS — Z741 Need for assistance with personal care: Secondary | ICD-10-CM | POA: Diagnosis not present

## 2019-05-22 ENCOUNTER — Encounter (HOSPITAL_COMMUNITY): Payer: Self-pay | Admitting: Hematology

## 2019-05-22 ENCOUNTER — Inpatient Hospital Stay (HOSPITAL_COMMUNITY): Payer: Medicare Other

## 2019-05-22 ENCOUNTER — Inpatient Hospital Stay (HOSPITAL_BASED_OUTPATIENT_CLINIC_OR_DEPARTMENT_OTHER): Payer: Medicare Other | Admitting: Hematology

## 2019-05-22 ENCOUNTER — Other Ambulatory Visit: Payer: Self-pay

## 2019-05-22 VITALS — BP 140/55 | HR 77 | Resp 16

## 2019-05-22 DIAGNOSIS — C7801 Secondary malignant neoplasm of right lung: Secondary | ICD-10-CM | POA: Diagnosis not present

## 2019-05-22 DIAGNOSIS — F0390 Unspecified dementia without behavioral disturbance: Secondary | ICD-10-CM | POA: Diagnosis not present

## 2019-05-22 DIAGNOSIS — Z5112 Encounter for antineoplastic immunotherapy: Secondary | ICD-10-CM | POA: Diagnosis not present

## 2019-05-22 DIAGNOSIS — C50912 Malignant neoplasm of unspecified site of left female breast: Secondary | ICD-10-CM

## 2019-05-22 DIAGNOSIS — C7802 Secondary malignant neoplasm of left lung: Secondary | ICD-10-CM | POA: Diagnosis not present

## 2019-05-22 DIAGNOSIS — C50812 Malignant neoplasm of overlapping sites of left female breast: Secondary | ICD-10-CM

## 2019-05-22 DIAGNOSIS — Z741 Need for assistance with personal care: Secondary | ICD-10-CM | POA: Diagnosis not present

## 2019-05-22 DIAGNOSIS — C773 Secondary and unspecified malignant neoplasm of axilla and upper limb lymph nodes: Secondary | ICD-10-CM | POA: Diagnosis not present

## 2019-05-22 DIAGNOSIS — R410 Disorientation, unspecified: Secondary | ICD-10-CM | POA: Diagnosis not present

## 2019-05-22 DIAGNOSIS — N644 Mastodynia: Secondary | ICD-10-CM | POA: Diagnosis not present

## 2019-05-22 LAB — COMPREHENSIVE METABOLIC PANEL
ALT: 19 U/L (ref 0–44)
AST: 27 U/L (ref 15–41)
Albumin: 3.7 g/dL (ref 3.5–5.0)
Alkaline Phosphatase: 105 U/L (ref 38–126)
Anion gap: 10 (ref 5–15)
BUN: 12 mg/dL (ref 8–23)
CO2: 26 mmol/L (ref 22–32)
Calcium: 9.2 mg/dL (ref 8.9–10.3)
Chloride: 103 mmol/L (ref 98–111)
Creatinine, Ser: 0.62 mg/dL (ref 0.44–1.00)
GFR calc Af Amer: >60 mL/min (ref >60)
GFR calc non Af Amer: >60 mL/min (ref >60)
Glucose, Bld: 84 mg/dL (ref 70–99)
Potassium: 4.3 mmol/L (ref 3.5–5.1)
Sodium: 139 mmol/L (ref 135–145)
Total Bilirubin: 0.3 mg/dL (ref 0.3–1.2)
Total Protein: 8 g/dL (ref 6.5–8.1)

## 2019-05-22 LAB — CBC WITH DIFFERENTIAL/PLATELET
Abs Immature Granulocytes: 0.1 10*3/uL — ABNORMAL HIGH (ref 0.00–0.07)
Basophils Absolute: 0.1 10*3/uL (ref 0.0–0.1)
Basophils Relative: 1 %
Eosinophils Absolute: 0.1 10*3/uL (ref 0.0–0.5)
Eosinophils Relative: 1 %
HCT: 41.4 % (ref 36.0–46.0)
Hemoglobin: 12.5 g/dL (ref 12.0–15.0)
Immature Granulocytes: 1 %
Lymphocytes Relative: 23 %
Lymphs Abs: 2.5 10*3/uL (ref 0.7–4.0)
MCH: 26.7 pg (ref 26.0–34.0)
MCHC: 30.2 g/dL (ref 30.0–36.0)
MCV: 88.5 fL (ref 80.0–100.0)
Monocytes Absolute: 0.8 10*3/uL (ref 0.1–1.0)
Monocytes Relative: 7 %
Neutro Abs: 7.4 10*3/uL (ref 1.7–7.7)
Neutrophils Relative %: 67 %
Platelets: 349 10*3/uL (ref 150–400)
RBC: 4.68 MIL/uL (ref 3.87–5.11)
RDW: 19 % — ABNORMAL HIGH (ref 11.5–15.5)
WBC: 10.9 10*3/uL — ABNORMAL HIGH (ref 4.0–10.5)
nRBC: 0 % (ref 0.0–0.2)

## 2019-05-22 MED ORDER — SODIUM CHLORIDE 0.9 % IV SOLN
10.0000 mg | Freq: Once | INTRAVENOUS | Status: AC
  Administered 2019-05-22: 10 mg via INTRAVENOUS
  Filled 2019-05-22: qty 10

## 2019-05-22 MED ORDER — DIPHENHYDRAMINE HCL 25 MG PO CAPS
50.0000 mg | ORAL_CAPSULE | Freq: Once | ORAL | Status: AC
  Administered 2019-05-22: 50 mg via ORAL
  Filled 2019-05-22: qty 2

## 2019-05-22 MED ORDER — ACETAMINOPHEN 325 MG PO TABS
650.0000 mg | ORAL_TABLET | Freq: Once | ORAL | Status: AC
  Administered 2019-05-22: 650 mg via ORAL
  Filled 2019-05-22: qty 2

## 2019-05-22 MED ORDER — PALONOSETRON HCL INJECTION 0.25 MG/5ML
0.2500 mg | Freq: Once | INTRAVENOUS | Status: AC
  Administered 2019-05-22: 11:00:00 0.25 mg via INTRAVENOUS
  Filled 2019-05-22: qty 5

## 2019-05-22 MED ORDER — SODIUM CHLORIDE 0.9% FLUSH
10.0000 mL | INTRAVENOUS | Status: DC | PRN
  Administered 2019-05-22: 10 mL
  Filled 2019-05-22: qty 10

## 2019-05-22 MED ORDER — DEXTROSE 5 % IV SOLN
Freq: Once | INTRAVENOUS | Status: AC
  Administered 2019-05-22: 11:00:00 via INTRAVENOUS

## 2019-05-22 MED ORDER — HEPARIN SOD (PORK) LOCK FLUSH 100 UNIT/ML IV SOLN
500.0000 [IU] | Freq: Once | INTRAVENOUS | Status: AC | PRN
  Administered 2019-05-22: 500 [IU]

## 2019-05-22 MED ORDER — FAM-TRASTUZUMAB DERUXTECAN-NXKI CHEMO 100 MG IV SOLR
5.4000 mg/kg | Freq: Once | INTRAVENOUS | Status: AC
  Administered 2019-05-22: 372 mg via INTRAVENOUS
  Filled 2019-05-22: qty 18.6

## 2019-05-22 MED ORDER — ACETAMINOPHEN 325 MG PO TABS
ORAL_TABLET | ORAL | Status: AC
  Filled 2019-05-22: qty 2

## 2019-05-22 NOTE — Patient Instructions (Signed)
[facility] at [facility] Discharge Instructions  You were seen today by Dr. Katragadda. He went over your recent lab results. He will see you back in 3 weeks for labs and follow up.   Thank you for choosing [facility] at [facility] to provide your oncology and hematology care.  To afford each patient quality time with our provider, please arrive at least 15 minutes before your scheduled appointment time.   If you have a lab appointment with the Cancer Center please come in thru the  Main Entrance and check in at the main information desk  You need to re-schedule your appointment should you arrive 10 or more minutes late.  We strive to give you quality time with our providers, and arriving late affects you and other patients whose appointments are after yours.  Also, if you no show three or more times for appointments you may be dismissed from the clinic at the providers discretion.     Again, thank you for choosing [facility].  Our hope is that these requests will decrease the amount of time that you wait before being seen by our physicians.       _____________________________________________________________  Should you have questions after your visit to [facility], please contact our office at [phone] between the hours of 8:00 a.m. and 4:30 p.m.  Voicemails left after 4:00 p.m. will not be returned until the following business day.  For prescription refill requests, have your pharmacy contact our office and allow 72 hours.    Cancer Center Support Programs:   > Cancer Support Group  2nd Tuesday of the month 1pm-2pm, Journey Room    

## 2019-05-22 NOTE — Patient Instructions (Signed)
[facility] at [facility] Discharge Instructions  Labs drawn from portacath today   Thank you for choosing [facility] at [facility] to provide your oncology and hematology care.  To afford each patient quality time with our provider, please arrive at least 15 minutes before your scheduled appointment time.   If you have a lab appointment with the Cancer Center please come in thru the Main Entrance and check in at the main information desk.  You need to re-schedule your appointment should you arrive 10 or more minutes late.  We strive to give you quality time with our providers, and arriving late affects you and other patients whose appointments are after yours.  Also, if you no show three or more times for appointments you may be dismissed from the clinic at the providers discretion.     Again, thank you for choosing [facility].  Our hope is that these requests will decrease the amount of time that you wait before being seen by our physicians.       _____________________________________________________________  Should you have questions after your visit to [facility], please contact our office at [phone] between the hours of 8:00 a.m. and 4:30 p.m.  Voicemails left after 4:00 p.m. will not be returned until the following business day.  For prescription refill requests, have your pharmacy contact our office and allow 72 hours.    Due to Covid, you will need to wear a mask upon entering the hospital. If you do not have a mask, a mask will be given to you at the Main Entrance upon arrival. For doctor visits, patients may have 1 support person with them. For treatment visits, patients can not have anyone with them due to social distancing guidelines and our immunocompromised population.     

## 2019-05-22 NOTE — Patient Instructions (Signed)
[facility] Discharge Instructions for Patients Receiving Chemotherapy  Today you received the following chemotherapy agents   To help prevent nausea and vomiting after your treatment, we encourage you to take your nausea medication   If you develop nausea and vomiting that is not controlled by your nausea medication, call the clinic.   BELOW ARE SYMPTOMS THAT SHOULD BE REPORTED IMMEDIATELY:  *FEVER GREATER THAN 100.5 F  *CHILLS WITH OR WITHOUT FEVER  NAUSEA AND VOMITING THAT IS NOT CONTROLLED WITH YOUR NAUSEA MEDICATION  *UNUSUAL SHORTNESS OF BREATH  *UNUSUAL BRUISING OR BLEEDING  TENDERNESS IN MOUTH AND THROAT WITH OR WITHOUT PRESENCE OF ULCERS  *URINARY PROBLEMS  *BOWEL PROBLEMS  UNUSUAL RASH Items with * indicate a potential emergency and should be followed up as soon as possible.  Feel free to call the clinic should you have any questions or concerns. The clinic phone number is [phone].  Please show the CHEMO ALERT CARD at check-in to the Emergency Department and triage nurse.   

## 2019-05-22 NOTE — Progress Notes (Signed)
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      BRIEF ONCOLOGIC HISTORY:  Oncology History  Malignant neoplasm of overlapping sites of left female breast ([place])  05/15/2018 Initial Diagnosis   Malignant neoplasm of overlapping sites of left female breast ([place])   06/05/2018 - 01/17/2019 Chemotherapy   The patient had pegfilgrastim-cbqv (UDENYCA) injection 6 mg, 6 mg, Subcutaneous, Once, 6 of 6 cycles Administration: 6 mg (06/07/2018), 6 mg (06/28/2018), 6 mg (07/19/2018), 6 mg (08/30/2018), 6 mg (09/20/2018), 6 mg (08/10/2018) trastuzumab (HERCEPTIN) 600 mg in sodium chloride 0.9 % 250 mL chemo infusion, 609 mg, Intravenous,  Once, 10 of 13 cycles Administration: 600 mg (06/05/2018), 450 mg (06/26/2018), 450 mg (07/17/2018), 450 mg (08/28/2018), 450 mg (09/18/2018), 450 mg (08/07/2018), 450 mg (10/19/2018), 450 mg (11/09/2018), 450 mg (12/05/2018), 450 mg (12/26/2018) DOCEtaxel (TAXOTERE) 140 mg in sodium chloride 0.9 % 250 mL chemo infusion, 75 mg/m2 = 140 mg, Intravenous,  Once, 6 of 6 cycles Administration: 140 mg (06/05/2018), 140 mg (06/26/2018), 140 mg (07/17/2018), 140 mg (08/28/2018), 140 mg (09/18/2018), 140 mg (08/07/2018) ondansetron (ZOFRAN) 8 mg, dexamethasone (DECADRON) 4 mg in sodium chloride 0.9 % 50 mL IVPB, , Intravenous,  Once, 6 of 6 cycles Administration:  (06/05/2018),  (06/26/2018),  (07/17/2018),  (08/28/2018),  (09/18/2018),  (08/07/2018) pertuzumab (PERJETA) 840 mg in sodium chloride 0.9 % 250 mL chemo infusion, 840 mg, Intravenous, Once, 10 of 13 cycles Administration: 840 mg (06/05/2018), 420 mg (06/26/2018), 420 mg (07/17/2018), 420 mg (08/28/2018), 420 mg (09/18/2018), 420 mg (08/07/2018), 420 mg (10/19/2018), 420 mg  (11/09/2018), 420 mg (12/05/2018), 420 mg (12/26/2018)  for chemotherapy treatment.    05/01/2019 -  Chemotherapy   The patient had palonosetron (ALOXI) injection 0.25 mg, 0.25 mg, Intravenous,  Once, 2 of 6 cycles Administration: 0.25 mg (05/01/2019) pegfilgrastim-jmdb (FULPHILA) injection 6 mg, 6 mg, Subcutaneous,  Once, 2 of 6 cycles Administration: 6 mg (05/03/2019) fam-trastuzumab deruxtecan-nxki (ENHERTU) 372 mg in dextrose 5 % 100 mL chemo infusion, 5.4 mg/kg = 372 mg, Intravenous,  Once, 2 of 6 cycles Administration: 372 mg (05/01/2019)  for chemotherapy treatment.    HER2-positive carcinoma of left breast ([place])  05/30/2018 Initial Diagnosis   HER2-positive carcinoma of left breast ([place])   01/26/2019 - 04/26/2019 Chemotherapy   The patient had ado-trastuzumab emtansine (KADCYLA) 260 mg in sodium chloride 0.9 % 250 mL chemo infusion, 240 mg, Intravenous, Once, 4 of 5 cycles Administration: 260 mg (01/26/2019), 260 mg (02/16/2019), 260 mg (03/09/2019), 260 mg (04/06/2019)  for chemotherapy treatment.    05/01/2019 -  Chemotherapy   The patient had palonosetron (ALOXI) injection 0.25 mg, 0.25 mg, Intravenous,  Once, 2 of 6 cycles Administration: 0.25 mg (05/01/2019) pegfilgrastim-jmdb (FULPHILA) injection 6 mg, 6 mg, Subcutaneous,  Once, 2 of 6 cycles Administration: 6 mg (05/03/2019) fam-trastuzumab deruxtecan-nxki (ENHERTU) 372 mg in dextrose 5 % 100 mL chemo infusion, 5.4 mg/kg = 372 mg, Intravenous,  Once, 2 of 6 cycles Administration: 372 mg (05/01/2019)  for chemotherapy treatment.       CANCER STAGING: Cancer Staging No matching staging information was found for the patient.   INTERVAL HISTORY:  [name] [name] 63 y.o. female seen for follow-up of metastatic HER-2 positive left breast cancer to the lungs.  Denies any cough or expectoration.  Denies any shortness of breath on exertion.  She has tolerated her first cycle of chemotherapy very well.  Denied any nausea, vomiting, diarrhea or  constipation.  Left breast pain has improved.  Denies any fevers or chills.  Appetite and energy levels are 100%.  REVIEW OF SYSTEMS:  Review of Systems  All other systems reviewed and are negative.    PAST MEDICAL/SURGICAL HISTORY:  Past Medical History:  Diagnosis Date  . Alzheimer's dementia ([place])   . Bradycardia   . Breast cancer ([place])   . Dementia ([place])   . Depression   . Hypokalemia   . Hypotension   . 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 ABDOMINAL HYSTERECTOMY    . BREAST BIOPSY Left   . PORTACATH PLACEMENT Right 05/15/2018   Procedure: INSERTION PORT-A-CATH;  Surgeon: Aviva Signs, MD;  Location: [facility];  Service: General;  Laterality: Right;     SOCIAL HISTORY:  Social History   Socioeconomic History  . Marital status: Widowed    Spouse name: Not on file  . Number of children: Not on file  . Years of education: Not on file  . Highest education level: Not on file  Occupational History  . Not on file  Social Needs  . Financial resource strain: Not on file  . Food insecurity    Worry: Not on file    Inability: Not on file  . Transportation needs    Medical: Not on file    Non-medical: Not on file  Tobacco Use  . Smoking status: Unknown If Ever Smoked  . Smokeless tobacco: Never Used  Substance and Sexual Activity  . Alcohol use: Not Currently  . Drug use: Never  . Sexual activity: Not Currently  Lifestyle  . Physical activity    Days per week: Not on file    Minutes per session: Not on file  . Stress: Not on file  Relationships  . Social Herbalist on phone: Not on file    Gets together: Not on file    Attends religious service: Not on file    Active member of club or organization: Not on file    Attends meetings of clubs or organizations: Not on file    Relationship status: Not on file  . Intimate partner violence    Fear of current or ex  partner: Not on file    Emotionally abused: Not on file    Physically abused: Not on file    Forced sexual activity: Not on file  Other Topics Concern  . Not on file  Social History Narrative   Lives at [facility] in [place]:  History reviewed. No pertinent family history.  CURRENT MEDICATIONS:  Outpatient Encounter Medications as of 05/22/2019  Medication Sig  . divalproex (DEPAKOTE) 250 MG DR tablet Take 250 mg by mouth at bedtime.   . divalproex (DEPAKOTE) 500 MG DR tablet Take 500 mg by mouth every morning.   . donepezil (ARICEPT) 10 MG tablet Take 10 mg by mouth at bedtime.   . fam-trastuzumab deruxtecan-nxki 5.4 mg/kg in dextrose 5 % 100 mL Inject 5.4 mg/kg into the vein every 21 ( twenty-one) days.  . memantine (NAMENDA) 10 MG tablet Take 10 mg by mouth 2 (two) times daily.  . mirtazapine (REMERON) 7.5 MG tablet Take 7.5 mg by mouth at bedtime.  Marland Kitchen  QUEtiapine (SEROQUEL) 25 MG tablet Take 25 mg by mouth 2 (two) times daily.   . sertraline (ZOLOFT) 100 MG tablet Take 100 mg by mouth daily.   Marland Kitchen VITAMIN D, CHOLECALCIFEROL, PO Take 2,000 Int'l Units by mouth daily.  Marland Kitchen acetaminophen (TYLENOL) 325 MG tablet Take 650 mg by mouth every 6 (six) hours as needed for mild pain.  Marland Kitchen ALPRAZolam (XANAX) 0.5 MG tablet Take 0.5 mg by mouth 3 (three) times daily as needed.   . betamethasone dipropionate (DIPROLENE) 0.05 % cream 2 (two) times daily. Apply to left breast two times a day for dermatitis use on days that resident doesn't go for chemotherapy.  . busPIRone (BUSPAR) 5 MG tablet Take 5 mg by mouth 2 (two) times daily.  Marland Kitchen docusate sodium (COLACE) 100 MG capsule Take 200 mg by mouth daily. May increase to 2 capsules in the am and 2 capsules in the pm. If no BM, call the [facility].  Marland Kitchen HYDROcodone-acetaminophen (NORCO/VICODIN) 5-325 MG tablet Take 1 tablet by mouth every 6 (six) hours as needed.   . ondansetron (ZOFRAN) 4 MG tablet Take 4 mg by mouth every 8 (eight)  hours as needed for nausea or vomiting.  . [DISCONTINUED] divalproex (DEPAKOTE) 250 MG DR tablet Take 250 mg by mouth 3 (three) times daily.  . [DISCONTINUED] prochlorperazine (COMPAZINE) 10 MG tablet Take 1 tablet (10 mg total) by mouth every 6 (six) hours as needed (Nausea or vomiting).   No facility-administered encounter medications on file as of 05/22/2019.     ALLERGIES:  No Known Allergies   PHYSICAL EXAM:  ECOG Performance status: 1  Vitals:   05/22/19 0921  BP: 133/66  Pulse: 81  Resp: 18  Temp: 97.8 F (36.6 C)  SpO2: 98%   Filed Weights   05/22/19 0921  Weight: 148 lb 11.2 oz (67.4 kg)    Physical Exam Vitals signs reviewed.  Constitutional:      Appearance: Normal appearance.  Cardiovascular:     Rate and Rhythm: Normal rate and regular rhythm.     Heart sounds: Normal heart sounds.  Pulmonary:     Effort: Pulmonary effort is normal.     Breath sounds: Normal breath sounds.  Abdominal:     General: There is no distension.     Palpations: Abdomen is soft. There is no mass.  Musculoskeletal:        General: No swelling.  Skin:    General: Skin is warm.  Neurological:     General: No focal deficit present.     Mental Status: She is alert and oriented to person, place, and time.  Psychiatric:        Mood and Affect: Mood normal.        Behavior: Behavior normal.    Left breast erythema has improved.  Mass is stable.  LABORATORY DATA:  I have reviewed the labs as listed.  CBC    Component Value Date/Time   WBC 10.9 (H) 05/22/2019 0931   RBC 4.68 05/22/2019 0931   HGB 12.5 05/22/2019 0931   HCT 41.4 05/22/2019 0931   PLT 349 05/22/2019 0931   MCV 88.5 05/22/2019 0931   MCH 26.7 05/22/2019 0931   MCHC 30.2 05/22/2019 0931   RDW 19.0 (H) 05/22/2019 0931   LYMPHSABS 2.5 05/22/2019 0931   MONOABS 0.8 05/22/2019 0931   EOSABS 0.1 05/22/2019 0931   BASOSABS 0.1 05/22/2019 0931   CMP Latest Ref Rng & Units 05/22/2019 05/08/2019 05/01/2019  Glucose  70 -  99 mg/dL 84 100(H) 97  BUN 8 - 23 mg/dL _0 Creatinine 0.44 - 1.00 mg/dL 0.62 0.79 0.57  Sodium 135 - 145 mmol/L 139 139 140  Potassium 3.5 - 5.1 mmol/L 4.3 4.0 4.3  Chloride 98 - 111 mmol/L 103 101 103  CO2 22 - 32 mmol/L _1 Calcium 8.9 - 10.3 mg/dL 9.2 9.2 9.2  Total Protein 6.5 - 8.1 g/dL 8.0 7.8 7.8  Total Bilirubin 0.3 - 1.2 mg/dL 0.3 0.6 0.3  Alkaline Phos 38 - 126 U/L 105 162(H) 80  AST 15 - 41 U/L _2 ALT 0 - 44 U/L _3 DIAGNOSTIC IMAGING:  I have independently reviewed the scans and discussed with the patient.   I have reviewed Venita Lick LPN's note and agree with the documentation.  I personally performed a face-to-face visit, made revisions and my assessment and plan is as follows.    ASSESSMENT & PLAN:   HER2-positive carcinoma of left breast ([place]) 1.  Metastatic HER-2 positive left breast cancer to the lungs: -Biopsy of the left breast and left axillary lymph node showed invasive mammary carcinoma, ER/PR negative, Ki-67 50%, HER-2 positive by FISH. - 6 cycles of THP from 06/05/2018-09/18/2018 with maintenance Herceptin and Pertuzumab continued until 12/26/2026 progression. -4 cycles of Kadcyla from 01/26/2019-04/06/2019 with progression. - PET scan on 04/09/2019 showed 6.7 x 5.9 cm left breast mass, left axillary lymph node, left upper lobe lung mass, right lower lobe lung mass, although have increased.  No bone metastasis. - Echocardiogram on 04/24/2019 shows EF 60-65%.  MRI brain negative. - Cycle 1 of trastuzumab Deruxtecan on 05/01/2019. - She tolerated the medication very well.  Physical examination today shows significant improvement in the left breast erythema and mass. - We reviewed her labs.  She will proceed with her cycle 2 today.  She will be seen back in 3 weeks for follow-up and cycle 3.  2.  Left breast pain: -She will use hydrocodone 10 mg as needed.  Total time spent is 25 minutes with more than 50% of the time spent  face-to-face discussing treatment plan, counseling and coordination of care.    Orders placed this encounter:  No orders of the defined types were placed in this encounter.     Derek Jack, MD [place] [phone]

## 2019-05-22 NOTE — Assessment & Plan Note (Signed)
1.  Metastatic HER-2 positive left breast cancer to the lungs: -Biopsy of the left breast and left axillary lymph node showed invasive mammary carcinoma, ER/PR negative, Ki-67 50%, HER-2 positive by FISH. - 6 cycles of THP from 06/05/2018-09/18/2018 with maintenance Herceptin and Pertuzumab continued until 12/26/2026 progression. -4 cycles of Kadcyla from 01/26/2019-04/06/2019 with progression. - PET scan on 04/09/2019 showed 6.7 x 5.9 cm left breast mass, left axillary lymph node, left upper lobe lung mass, right lower lobe lung mass, although have increased.  No bone metastasis. - Echocardiogram on 04/24/2019 shows EF 60-65%.  MRI brain negative. - Cycle 1 of trastuzumab Deruxtecan on 05/01/2019. - She tolerated the medication very well.  Physical examination today shows significant improvement in the left breast erythema and mass. - We reviewed her labs.  She will proceed with her cycle 2 today.  She will be seen back in 3 weeks for follow-up and cycle 3.  2.  Left breast pain: -She will use hydrocodone 10 mg as needed.

## 2019-05-22 NOTE — Progress Notes (Signed)
Labs reviewed with MD today. Proceed with treatment today per MD.  Treatment given per orders. Patient tolerated it well without problems. Vitals stable and discharged home from clinic ambulatory with sitter at her side. Follow up as scheduled.

## 2019-05-23 DIAGNOSIS — R410 Disorientation, unspecified: Secondary | ICD-10-CM | POA: Diagnosis not present

## 2019-05-23 DIAGNOSIS — Z741 Need for assistance with personal care: Secondary | ICD-10-CM | POA: Diagnosis not present

## 2019-05-23 DIAGNOSIS — F0390 Unspecified dementia without behavioral disturbance: Secondary | ICD-10-CM | POA: Diagnosis not present

## 2019-05-24 ENCOUNTER — Inpatient Hospital Stay (HOSPITAL_COMMUNITY): Payer: Medicare Other | Attending: Hematology

## 2019-05-24 ENCOUNTER — Other Ambulatory Visit: Payer: Self-pay

## 2019-05-24 VITALS — BP 116/63 | HR 75 | Temp 97.9°F | Resp 18

## 2019-05-24 DIAGNOSIS — Z5112 Encounter for antineoplastic immunotherapy: Secondary | ICD-10-CM | POA: Diagnosis not present

## 2019-05-24 DIAGNOSIS — Z5189 Encounter for other specified aftercare: Secondary | ICD-10-CM | POA: Insufficient documentation

## 2019-05-24 DIAGNOSIS — R410 Disorientation, unspecified: Secondary | ICD-10-CM | POA: Diagnosis not present

## 2019-05-24 DIAGNOSIS — C7801 Secondary malignant neoplasm of right lung: Secondary | ICD-10-CM | POA: Insufficient documentation

## 2019-05-24 DIAGNOSIS — F0390 Unspecified dementia without behavioral disturbance: Secondary | ICD-10-CM | POA: Diagnosis not present

## 2019-05-24 DIAGNOSIS — C7802 Secondary malignant neoplasm of left lung: Secondary | ICD-10-CM | POA: Insufficient documentation

## 2019-05-24 DIAGNOSIS — Z741 Need for assistance with personal care: Secondary | ICD-10-CM | POA: Diagnosis not present

## 2019-05-24 DIAGNOSIS — C50812 Malignant neoplasm of overlapping sites of left female breast: Secondary | ICD-10-CM | POA: Diagnosis not present

## 2019-05-24 DIAGNOSIS — C50912 Malignant neoplasm of unspecified site of left female breast: Secondary | ICD-10-CM

## 2019-05-24 MED ORDER — PEGFILGRASTIM-JMDB 6 MG/0.6ML ~~LOC~~ SOSY
6.0000 mg | PREFILLED_SYRINGE | Freq: Once | SUBCUTANEOUS | Status: AC
  Administered 2019-05-24: 14:00:00 6 mg via SUBCUTANEOUS
  Filled 2019-05-24: qty 0.6

## 2019-05-24 NOTE — Patient Instructions (Signed)
[facility] at [facility]  Discharge Instructions:   _______________________________________________________________  Thank you for choosing [facility] at [facility] to provide your oncology and hematology care.  To afford each patient quality time with our providers, please arrive at least 15 minutes before your scheduled appointment.  You need to re-schedule your appointment if you arrive 10 or more minutes late.  We strive to give you quality time with our providers, and arriving late affects you and other patients whose appointments are after yours.  Also, if you no show three or more times for appointments you may be dismissed from the clinic.  Again, thank you for choosing [facility] at [facility]. Our hope is that these requests will allow you access to exceptional care and in a timely manner. _______________________________________________________________  If you have questions after your visit, please contact our office at [phone] between the hours of 8:30 a.m. and 5:00 p.m. Voicemails left after 4:30 p.m. will not be returned until the following business day. _______________________________________________________________  For prescription refill requests, have your pharmacy contact our office. _______________________________________________________________  Recommendations made by the consultant and any test results will be sent to your referring physician. _______________________________________________________________ 

## 2019-05-24 NOTE — Progress Notes (Signed)
[name] [name] presents today for injection per MD orders. Fulphila administered SQ in right Upper Arm. Administration without incident. Patient tolerated well.

## 2019-05-25 DIAGNOSIS — Z741 Need for assistance with personal care: Secondary | ICD-10-CM | POA: Diagnosis not present

## 2019-05-25 DIAGNOSIS — R410 Disorientation, unspecified: Secondary | ICD-10-CM | POA: Diagnosis not present

## 2019-05-25 DIAGNOSIS — F0390 Unspecified dementia without behavioral disturbance: Secondary | ICD-10-CM | POA: Diagnosis not present

## 2019-05-26 DIAGNOSIS — Z741 Need for assistance with personal care: Secondary | ICD-10-CM | POA: Diagnosis not present

## 2019-05-26 DIAGNOSIS — R410 Disorientation, unspecified: Secondary | ICD-10-CM | POA: Diagnosis not present

## 2019-05-26 DIAGNOSIS — F0390 Unspecified dementia without behavioral disturbance: Secondary | ICD-10-CM | POA: Diagnosis not present

## 2019-05-27 DIAGNOSIS — R410 Disorientation, unspecified: Secondary | ICD-10-CM | POA: Diagnosis not present

## 2019-05-27 DIAGNOSIS — Z741 Need for assistance with personal care: Secondary | ICD-10-CM | POA: Diagnosis not present

## 2019-05-27 DIAGNOSIS — F0390 Unspecified dementia without behavioral disturbance: Secondary | ICD-10-CM | POA: Diagnosis not present

## 2019-05-28 DIAGNOSIS — Z741 Need for assistance with personal care: Secondary | ICD-10-CM | POA: Diagnosis not present

## 2019-05-28 DIAGNOSIS — R410 Disorientation, unspecified: Secondary | ICD-10-CM | POA: Diagnosis not present

## 2019-05-28 DIAGNOSIS — F0390 Unspecified dementia without behavioral disturbance: Secondary | ICD-10-CM | POA: Diagnosis not present

## 2019-05-29 DIAGNOSIS — Z741 Need for assistance with personal care: Secondary | ICD-10-CM | POA: Diagnosis not present

## 2019-05-29 DIAGNOSIS — R410 Disorientation, unspecified: Secondary | ICD-10-CM | POA: Diagnosis not present

## 2019-05-29 DIAGNOSIS — F0390 Unspecified dementia without behavioral disturbance: Secondary | ICD-10-CM | POA: Diagnosis not present

## 2019-05-30 DIAGNOSIS — F0390 Unspecified dementia without behavioral disturbance: Secondary | ICD-10-CM | POA: Diagnosis not present

## 2019-05-30 DIAGNOSIS — Z741 Need for assistance with personal care: Secondary | ICD-10-CM | POA: Diagnosis not present

## 2019-05-30 DIAGNOSIS — R410 Disorientation, unspecified: Secondary | ICD-10-CM | POA: Diagnosis not present

## 2019-05-31 DIAGNOSIS — Z741 Need for assistance with personal care: Secondary | ICD-10-CM | POA: Diagnosis not present

## 2019-05-31 DIAGNOSIS — F0390 Unspecified dementia without behavioral disturbance: Secondary | ICD-10-CM | POA: Diagnosis not present

## 2019-05-31 DIAGNOSIS — R410 Disorientation, unspecified: Secondary | ICD-10-CM | POA: Diagnosis not present

## 2019-06-01 DIAGNOSIS — Z741 Need for assistance with personal care: Secondary | ICD-10-CM | POA: Diagnosis not present

## 2019-06-01 DIAGNOSIS — R410 Disorientation, unspecified: Secondary | ICD-10-CM | POA: Diagnosis not present

## 2019-06-01 DIAGNOSIS — F0390 Unspecified dementia without behavioral disturbance: Secondary | ICD-10-CM | POA: Diagnosis not present

## 2019-06-02 DIAGNOSIS — Z741 Need for assistance with personal care: Secondary | ICD-10-CM | POA: Diagnosis not present

## 2019-06-02 DIAGNOSIS — F0390 Unspecified dementia without behavioral disturbance: Secondary | ICD-10-CM | POA: Diagnosis not present

## 2019-06-02 DIAGNOSIS — R410 Disorientation, unspecified: Secondary | ICD-10-CM | POA: Diagnosis not present

## 2019-06-03 DIAGNOSIS — Z741 Need for assistance with personal care: Secondary | ICD-10-CM | POA: Diagnosis not present

## 2019-06-03 DIAGNOSIS — F0390 Unspecified dementia without behavioral disturbance: Secondary | ICD-10-CM | POA: Diagnosis not present

## 2019-06-03 DIAGNOSIS — R410 Disorientation, unspecified: Secondary | ICD-10-CM | POA: Diagnosis not present

## 2019-06-04 DIAGNOSIS — Z741 Need for assistance with personal care: Secondary | ICD-10-CM | POA: Diagnosis not present

## 2019-06-04 DIAGNOSIS — F0390 Unspecified dementia without behavioral disturbance: Secondary | ICD-10-CM | POA: Diagnosis not present

## 2019-06-04 DIAGNOSIS — R410 Disorientation, unspecified: Secondary | ICD-10-CM | POA: Diagnosis not present

## 2019-06-05 DIAGNOSIS — F0390 Unspecified dementia without behavioral disturbance: Secondary | ICD-10-CM | POA: Diagnosis not present

## 2019-06-05 DIAGNOSIS — R410 Disorientation, unspecified: Secondary | ICD-10-CM | POA: Diagnosis not present

## 2019-06-05 DIAGNOSIS — Z741 Need for assistance with personal care: Secondary | ICD-10-CM | POA: Diagnosis not present

## 2019-06-06 DIAGNOSIS — R410 Disorientation, unspecified: Secondary | ICD-10-CM | POA: Diagnosis not present

## 2019-06-06 DIAGNOSIS — Z741 Need for assistance with personal care: Secondary | ICD-10-CM | POA: Diagnosis not present

## 2019-06-06 DIAGNOSIS — F0390 Unspecified dementia without behavioral disturbance: Secondary | ICD-10-CM | POA: Diagnosis not present

## 2019-06-07 DIAGNOSIS — R410 Disorientation, unspecified: Secondary | ICD-10-CM | POA: Diagnosis not present

## 2019-06-07 DIAGNOSIS — F0390 Unspecified dementia without behavioral disturbance: Secondary | ICD-10-CM | POA: Diagnosis not present

## 2019-06-07 DIAGNOSIS — Z741 Need for assistance with personal care: Secondary | ICD-10-CM | POA: Diagnosis not present

## 2019-06-08 DIAGNOSIS — F0390 Unspecified dementia without behavioral disturbance: Secondary | ICD-10-CM | POA: Diagnosis not present

## 2019-06-08 DIAGNOSIS — Z741 Need for assistance with personal care: Secondary | ICD-10-CM | POA: Diagnosis not present

## 2019-06-08 DIAGNOSIS — F320 Major depressive disorder, single episode, mild: Secondary | ICD-10-CM | POA: Diagnosis not present

## 2019-06-08 DIAGNOSIS — R410 Disorientation, unspecified: Secondary | ICD-10-CM | POA: Diagnosis not present

## 2019-06-08 DIAGNOSIS — G309 Alzheimer's disease, unspecified: Secondary | ICD-10-CM | POA: Diagnosis not present

## 2019-06-09 DIAGNOSIS — Z741 Need for assistance with personal care: Secondary | ICD-10-CM | POA: Diagnosis not present

## 2019-06-09 DIAGNOSIS — R410 Disorientation, unspecified: Secondary | ICD-10-CM | POA: Diagnosis not present

## 2019-06-09 DIAGNOSIS — F0390 Unspecified dementia without behavioral disturbance: Secondary | ICD-10-CM | POA: Diagnosis not present

## 2019-06-10 DIAGNOSIS — F0390 Unspecified dementia without behavioral disturbance: Secondary | ICD-10-CM | POA: Diagnosis not present

## 2019-06-10 DIAGNOSIS — Z741 Need for assistance with personal care: Secondary | ICD-10-CM | POA: Diagnosis not present

## 2019-06-10 DIAGNOSIS — R410 Disorientation, unspecified: Secondary | ICD-10-CM | POA: Diagnosis not present

## 2019-06-11 ENCOUNTER — Other Ambulatory Visit: Payer: Self-pay

## 2019-06-11 DIAGNOSIS — F0390 Unspecified dementia without behavioral disturbance: Secondary | ICD-10-CM | POA: Diagnosis not present

## 2019-06-11 DIAGNOSIS — R410 Disorientation, unspecified: Secondary | ICD-10-CM | POA: Diagnosis not present

## 2019-06-11 DIAGNOSIS — Z741 Need for assistance with personal care: Secondary | ICD-10-CM | POA: Diagnosis not present

## 2019-06-12 ENCOUNTER — Inpatient Hospital Stay (HOSPITAL_BASED_OUTPATIENT_CLINIC_OR_DEPARTMENT_OTHER): Payer: Medicare Other | Admitting: Nurse Practitioner

## 2019-06-12 ENCOUNTER — Encounter (HOSPITAL_COMMUNITY): Payer: Self-pay | Admitting: Nurse Practitioner

## 2019-06-12 ENCOUNTER — Inpatient Hospital Stay (HOSPITAL_COMMUNITY): Payer: Medicare Other

## 2019-06-12 VITALS — BP 134/61 | HR 71 | Temp 96.8°F | Resp 18

## 2019-06-12 DIAGNOSIS — C7801 Secondary malignant neoplasm of right lung: Secondary | ICD-10-CM | POA: Diagnosis not present

## 2019-06-12 DIAGNOSIS — Z741 Need for assistance with personal care: Secondary | ICD-10-CM | POA: Diagnosis not present

## 2019-06-12 DIAGNOSIS — C50812 Malignant neoplasm of overlapping sites of left female breast: Secondary | ICD-10-CM | POA: Diagnosis not present

## 2019-06-12 DIAGNOSIS — F0390 Unspecified dementia without behavioral disturbance: Secondary | ICD-10-CM | POA: Diagnosis not present

## 2019-06-12 DIAGNOSIS — R410 Disorientation, unspecified: Secondary | ICD-10-CM | POA: Diagnosis not present

## 2019-06-12 DIAGNOSIS — Z171 Estrogen receptor negative status [ER-]: Secondary | ICD-10-CM

## 2019-06-12 DIAGNOSIS — C50912 Malignant neoplasm of unspecified site of left female breast: Secondary | ICD-10-CM

## 2019-06-12 DIAGNOSIS — C7802 Secondary malignant neoplasm of left lung: Secondary | ICD-10-CM | POA: Diagnosis not present

## 2019-06-12 DIAGNOSIS — Z5112 Encounter for antineoplastic immunotherapy: Secondary | ICD-10-CM | POA: Diagnosis not present

## 2019-06-12 DIAGNOSIS — Z5189 Encounter for other specified aftercare: Secondary | ICD-10-CM | POA: Diagnosis not present

## 2019-06-12 LAB — CBC WITH DIFFERENTIAL/PLATELET
Abs Immature Granulocytes: 0.03 10*3/uL (ref 0.00–0.07)
Basophils Absolute: 0 10*3/uL (ref 0.0–0.1)
Basophils Relative: 1 %
Eosinophils Absolute: 0.1 10*3/uL (ref 0.0–0.5)
Eosinophils Relative: 1 %
HCT: 40.9 % (ref 36.0–46.0)
Hemoglobin: 12.4 g/dL (ref 12.0–15.0)
Immature Granulocytes: 0 %
Lymphocytes Relative: 18 %
Lymphs Abs: 1.4 10*3/uL (ref 0.7–4.0)
MCH: 27.4 pg (ref 26.0–34.0)
MCHC: 30.3 g/dL (ref 30.0–36.0)
MCV: 90.5 fL (ref 80.0–100.0)
Monocytes Absolute: 0.4 10*3/uL (ref 0.1–1.0)
Monocytes Relative: 6 %
Neutro Abs: 5.9 10*3/uL (ref 1.7–7.7)
Neutrophils Relative %: 74 %
Platelets: 228 10*3/uL (ref 150–400)
RBC: 4.52 MIL/uL (ref 3.87–5.11)
RDW: 21.6 % — ABNORMAL HIGH (ref 11.5–15.5)
WBC: 7.9 10*3/uL (ref 4.0–10.5)
nRBC: 0 % (ref 0.0–0.2)

## 2019-06-12 LAB — COMPREHENSIVE METABOLIC PANEL
ALT: 17 U/L (ref 0–44)
AST: 24 U/L (ref 15–41)
Albumin: 3.6 g/dL (ref 3.5–5.0)
Alkaline Phosphatase: 113 U/L (ref 38–126)
Anion gap: 10 (ref 5–15)
BUN: 11 mg/dL (ref 8–23)
CO2: 25 mmol/L (ref 22–32)
Calcium: 9.1 mg/dL (ref 8.9–10.3)
Chloride: 105 mmol/L (ref 98–111)
Creatinine, Ser: 0.5 mg/dL (ref 0.44–1.00)
GFR calc Af Amer: >60 mL/min (ref >60)
GFR calc non Af Amer: >60 mL/min (ref >60)
Glucose, Bld: 88 mg/dL (ref 70–99)
Potassium: 4.1 mmol/L (ref 3.5–5.1)
Sodium: 140 mmol/L (ref 135–145)
Total Bilirubin: 0.1 mg/dL — ABNORMAL LOW (ref 0.3–1.2)
Total Protein: 7.2 g/dL (ref 6.5–8.1)

## 2019-06-12 MED ORDER — FAM-TRASTUZUMAB DERUXTECAN-NXKI CHEMO 100 MG IV SOLR
5.8000 mg/kg | Freq: Once | INTRAVENOUS | Status: AC
  Administered 2019-06-12: 400 mg via INTRAVENOUS
  Filled 2019-06-12: qty 20

## 2019-06-12 MED ORDER — PALONOSETRON HCL INJECTION 0.25 MG/5ML
INTRAVENOUS | Status: AC
  Filled 2019-06-12: qty 5

## 2019-06-12 MED ORDER — ACETAMINOPHEN 325 MG PO TABS
650.0000 mg | ORAL_TABLET | Freq: Once | ORAL | Status: AC
  Administered 2019-06-12: 11:00:00 650 mg via ORAL

## 2019-06-12 MED ORDER — HEPARIN SOD (PORK) LOCK FLUSH 100 UNIT/ML IV SOLN
500.0000 [IU] | Freq: Once | INTRAVENOUS | Status: AC | PRN
  Administered 2019-06-12: 14:00:00 500 [IU]

## 2019-06-12 MED ORDER — DEXTROSE 5 % IV SOLN
Freq: Once | INTRAVENOUS | Status: AC
  Administered 2019-06-12: 11:00:00 via INTRAVENOUS

## 2019-06-12 MED ORDER — SODIUM CHLORIDE 0.9 % IV SOLN
10.0000 mg | Freq: Once | INTRAVENOUS | Status: AC
  Administered 2019-06-12: 10 mg via INTRAVENOUS
  Filled 2019-06-12: qty 10

## 2019-06-12 MED ORDER — DIPHENHYDRAMINE HCL 25 MG PO CAPS
50.0000 mg | ORAL_CAPSULE | Freq: Once | ORAL | Status: AC
  Administered 2019-06-12: 11:00:00 50 mg via ORAL

## 2019-06-12 MED ORDER — ONDANSETRON HCL 4 MG PO TABS
4.0000 mg | ORAL_TABLET | Freq: Once | ORAL | Status: AC
  Administered 2019-06-12: 4 mg via ORAL

## 2019-06-12 MED ORDER — ONDANSETRON HCL 4 MG PO TABS: 4.0000 mg | ORAL_TABLET | Freq: Three times a day (TID) | ORAL | 1 refills | Status: DC | PRN

## 2019-06-12 MED ORDER — SODIUM CHLORIDE 0.9% FLUSH
10.0000 mL | INTRAVENOUS | Status: DC | PRN
  Administered 2019-06-12: 09:00:00 10 mL
  Filled 2019-06-12: qty 10

## 2019-06-12 MED ORDER — ACETAMINOPHEN 325 MG PO TABS
ORAL_TABLET | ORAL | Status: AC
  Filled 2019-06-12: qty 2

## 2019-06-12 MED ORDER — PALONOSETRON HCL INJECTION 0.25 MG/5ML
0.2500 mg | Freq: Once | INTRAVENOUS | Status: AC
  Administered 2019-06-12: 0.25 mg via INTRAVENOUS

## 2019-06-12 MED ORDER — DIPHENHYDRAMINE HCL 25 MG PO CAPS
ORAL_CAPSULE | ORAL | Status: AC
  Filled 2019-06-12: qty 2

## 2019-06-12 NOTE — Patient Instructions (Signed)
[facility] Discharge Instructions for Patients Receiving Chemotherapy  Today you received the following chemotherapy agents   To help prevent nausea and vomiting after your treatment, we encourage you to take your nausea medication   If you develop nausea and vomiting that is not controlled by your nausea medication, call the clinic.   BELOW ARE SYMPTOMS THAT SHOULD BE REPORTED IMMEDIATELY:  *FEVER GREATER THAN 100.5 F  *CHILLS WITH OR WITHOUT FEVER  NAUSEA AND VOMITING THAT IS NOT CONTROLLED WITH YOUR NAUSEA MEDICATION  *UNUSUAL SHORTNESS OF BREATH  *UNUSUAL BRUISING OR BLEEDING  TENDERNESS IN MOUTH AND THROAT WITH OR WITHOUT PRESENCE OF ULCERS  *URINARY PROBLEMS  *BOWEL PROBLEMS  UNUSUAL RASH Items with * indicate a potential emergency and should be followed up as soon as possible.  Feel free to call the clinic should you have any questions or concerns. The clinic phone number is [phone].  Please show the CHEMO ALERT CARD at check-in to the Emergency Department and triage nurse.   

## 2019-06-12 NOTE — Patient Instructions (Signed)
[facility] at [facility] Discharge Instructions  Follow-up in 3 weeks with labs and treatment.   Thank you for choosing [facility] at [facility] to provide your oncology and hematology care.  To afford each patient quality time with our provider, please arrive at least 15 minutes before your scheduled appointment time.   If you have a lab appointment with the Cancer Center please come in thru the Main Entrance and check in at the main information desk.  You need to re-schedule your appointment should you arrive 10 or more minutes late.  We strive to give you quality time with our providers, and arriving late affects you and other patients whose appointments are after yours.  Also, if you no show three or more times for appointments you may be dismissed from the clinic at the providers discretion.     Again, thank you for choosing [facility].  Our hope is that these requests will decrease the amount of time that you wait before being seen by our physicians.       _____________________________________________________________  Should you have questions after your visit to [facility], please contact our office at [phone] between the hours of 8:00 a.m. and 4:30 p.m.  Voicemails left after 4:00 p.m. will not be returned until the following business day.  For prescription refill requests, have your pharmacy contact our office and allow 72 hours.    Due to Covid, you will need to wear a mask upon entering the hospital. If you do not have a mask, a mask will be given to you at the Main Entrance upon arrival. For doctor visits, patients may have 1 support person with them. For treatment visits, patients can not have anyone with them due to social distancing guidelines and our immunocompromised population.      

## 2019-06-12 NOTE — Assessment & Plan Note (Addendum)
1.  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 - PET/CT scan on 05/11/2018 shows bilateral hypermetabolic pulmonary nodules consistent with pulmonary metastasis with no metastatic disease outside of the thorax. - On 05/23/2018 she had a lung biopsy shows poorly differentiated metastatic carcinoma, consistent with breast primary. -6 cycles of THP from 06/05/2018-09/18/2018. - PET scan on 09/29/2018 showed marked reduction in size and metabolic activity of bilateral pulmonary nodules and breast mass and adenopathy. -Maintenance Herceptin and Pertuzumab from 10/19/2018-12/26/2018 with progression. -MUGA scan on 11/30/2018 showed EF 61% - PET scan on 01/01/2019 showed progressive disease in the left breast primary, left axillary nodule and bilateral pulmonary metastasis. - She was switched to Kadcyla in the second line setting. -4 cycles of Kadcyla from 01/26/2019 through 04/06/2019 with progression. -PET scan on 04/09/2019 showed 6.7 x 5.9 cm left breast mass, left axillary lymph node, left upper lobe lung mass, right lower lobe lung mass, all have increased.  No bony metastasis. -Echocardiogram on 04/24/2019 showed EF of 60 to 65%. -MRI brain negative. -Patient was switched to Trastuzumab Deruxtecan on 05/01/2019. -She tolerated the medication very well. -Physical assessment today shows significant improvement in the left breast erythema and mass. -She will proceed with her third cycle today.  Labs are stable. -She will follow-up in 3 weeks with office visit, labs and treatment.  2.  Left breast pain: -She will use hydrocodone 10 mg as needed.

## 2019-06-12 NOTE — Progress Notes (Signed)
06/12/19  Tolerating treatment well and pharmacy will round to nearest vial size to prevent wastage as long as within 10% of dose.  New dose will be 400 mg.  Henreitta Leber, PharmD

## 2019-06-12 NOTE — Progress Notes (Signed)
[name] [name], [place] [address]   CLINIC:  Medical Oncology/Hematology  PCP:  Renata Caprice, DO [address] [place] [address] [phone]   REASON FOR VISIT: Follow-up for left breast cancer  CURRENT THERAPY: Trastuzumab  BRIEF ONCOLOGIC HISTORY:  Oncology History  Malignant neoplasm of overlapping sites of left female breast ([place])  05/15/2018 Initial Diagnosis   Malignant neoplasm of overlapping sites of left female breast ([place])   06/05/2018 - 01/17/2019 Chemotherapy   The patient had pegfilgrastim-cbqv (UDENYCA) injection 6 mg, 6 mg, Subcutaneous, Once, 6 of 6 cycles Administration: 6 mg (06/07/2018), 6 mg (06/28/2018), 6 mg (07/19/2018), 6 mg (08/30/2018), 6 mg (09/20/2018), 6 mg (08/10/2018) trastuzumab (HERCEPTIN) 600 mg in sodium chloride 0.9 % 250 mL chemo infusion, 609 mg, Intravenous,  Once, 10 of 13 cycles Administration: 600 mg (06/05/2018), 450 mg (06/26/2018), 450 mg (07/17/2018), 450 mg (08/28/2018), 450 mg (09/18/2018), 450 mg (08/07/2018), 450 mg (10/19/2018), 450 mg (11/09/2018), 450 mg (12/05/2018), 450 mg (12/26/2018) DOCEtaxel (TAXOTERE) 140 mg in sodium chloride 0.9 % 250 mL chemo infusion, 75 mg/m2 = 140 mg, Intravenous,  Once, 6 of 6 cycles Administration: 140 mg (06/05/2018), 140 mg (06/26/2018), 140 mg (07/17/2018), 140 mg (08/28/2018), 140 mg (09/18/2018), 140 mg (08/07/2018) ondansetron (ZOFRAN) 8 mg, dexamethasone (DECADRON) 4 mg in sodium chloride 0.9 % 50 mL IVPB, , Intravenous,  Once, 6 of 6 cycles Administration:  (06/05/2018),  (06/26/2018),  (07/17/2018),  (08/28/2018),  (09/18/2018),  (08/07/2018) pertuzumab (PERJETA) 840 mg in sodium chloride 0.9 % 250 mL chemo infusion, 840 mg, Intravenous, Once, 10 of 13 cycles Administration: 840 mg (06/05/2018), 420 mg (06/26/2018), 420 mg (07/17/2018), 420 mg (08/28/2018), 420 mg (09/18/2018), 420 mg (08/07/2018), 420 mg (10/19/2018), 420 mg (11/09/2018), 420 mg (12/05/2018), 420 mg (12/26/2018)  for  chemotherapy treatment.    05/01/2019 -  Chemotherapy   The patient had palonosetron (ALOXI) injection 0.25 mg, 0.25 mg, Intravenous,  Once, 3 of 6 cycles Administration: 0.25 mg (05/01/2019), 0.25 mg (05/22/2019) pegfilgrastim-jmdb (FULPHILA) injection 6 mg, 6 mg, Subcutaneous,  Once, 3 of 6 cycles Administration: 6 mg (05/03/2019), 6 mg (05/24/2019) fam-trastuzumab deruxtecan-nxki (ENHERTU) 372 mg in dextrose 5 % 100 mL chemo infusion, 5.4 mg/kg = 372 mg, Intravenous,  Once, 3 of 6 cycles Administration: 372 mg (05/01/2019), 372 mg (05/22/2019)  for chemotherapy treatment.    HER2-positive carcinoma of left breast ([place])  05/30/2018 Initial Diagnosis   HER2-positive carcinoma of left breast ([place])   01/26/2019 - 04/26/2019 Chemotherapy   The patient had ado-trastuzumab emtansine (KADCYLA) 260 mg in sodium chloride 0.9 % 250 mL chemo infusion, 240 mg, Intravenous, Once, 4 of 5 cycles Administration: 260 mg (01/26/2019), 260 mg (02/16/2019), 260 mg (03/09/2019), 260 mg (04/06/2019)  for chemotherapy treatment.    05/01/2019 -  Chemotherapy   The patient had palonosetron (ALOXI) injection 0.25 mg, 0.25 mg, Intravenous,  Once, 3 of 6 cycles Administration: 0.25 mg (05/01/2019), 0.25 mg (05/22/2019) pegfilgrastim-jmdb (FULPHILA) injection 6 mg, 6 mg, Subcutaneous,  Once, 3 of 6 cycles Administration: 6 mg (05/03/2019), 6 mg (05/24/2019) fam-trastuzumab deruxtecan-nxki (ENHERTU) 372 mg in dextrose 5 % 100 mL chemo infusion, 5.4 mg/kg = 372 mg, Intravenous,  Once, 3 of 6 cycles Administration: 372 mg (05/01/2019), 372 mg (05/22/2019)  for chemotherapy treatment.       INTERVAL HISTORY:  [name] [name] 63 y.o. female returns for routine follow-up for breast cancer.  Patient is doing well at her assisted living facility.  She does have  complaints of nausea and vomiting for 2 or 3 days after treatment.  She denies any diarrhea.  She denies any shortness of breath or cough. Denies any new pains. Had not noticed any recent  bleeding such as epistaxis, hematuria or hematochezia. Denies recent chest pain on exertion, shortness of breath on minimal exertion, pre-syncopal episodes, or palpitations. Denies any numbness or tingling in hands or feet. Denies any recent fevers, infections, or recent hospitalizations. Patient reports appetite at 100% and energy level at 100%.  She is eating well maintaining her weight at this time.    REVIEW OF SYSTEMS:  Review of Systems  Gastrointestinal: Positive for nausea.  All other systems reviewed and are negative.    PAST MEDICAL/SURGICAL HISTORY:  Past Medical History:  Diagnosis Date  . Alzheimer's dementia ([place])   . Bradycardia   . Breast cancer ([place])   . Dementia ([place])   . Depression   . Hypokalemia   . Hypotension   . 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 ABDOMINAL HYSTERECTOMY    . BREAST BIOPSY Left   . PORTACATH PLACEMENT Right 05/15/2018   Procedure: INSERTION PORT-A-CATH;  Surgeon: Aviva Signs, MD;  Location: [facility];  Service: General;  Laterality: Right;     SOCIAL HISTORY:  Social History   Socioeconomic History  . Marital status: Widowed    Spouse name: Not on file  . Number of children: Not on file  . Years of education: Not on file  . Highest education level: Not on file  Occupational History  . Not on file  Social Needs  . Financial resource strain: Not on file  . Food insecurity    Worry: Not on file    Inability: Not on file  . Transportation needs    Medical: Not on file    Non-medical: Not on file  Tobacco Use  . Smoking status: Unknown If Ever Smoked  . Smokeless tobacco: Never Used  Substance and Sexual Activity  . Alcohol use: Not Currently  . Drug use: Never  . Sexual activity: Not Currently  Lifestyle  . Physical activity    Days per week: Not on file    Minutes per session: Not on file  . Stress: Not on file  Relationships  .  Social Herbalist on phone: Not on file    Gets together: Not on file    Attends religious service: Not on file    Active member of club or organization: Not on file    Attends meetings of clubs or organizations: Not on file    Relationship status: Not on file  . Intimate partner violence    Fear of current or ex partner: Not on file    Emotionally abused: Not on file    Physically abused: Not on file    Forced sexual activity: Not on file  Other Topics Concern  . Not on file  Social History Narrative   Lives at [facility] in [place]:  History reviewed. No pertinent family history.  CURRENT MEDICATIONS:  Outpatient Encounter Medications as of 06/12/2019  Medication Sig  . ALPRAZolam (XANAX) 0.5 MG tablet Take 0.5 mg by mouth 3 (three) times daily as needed.   . betamethasone dipropionate (DIPROLENE) 0.05 % cream 2 (two) times daily. Apply to left breast two times a day for dermatitis use on days that resident doesn't go  for chemotherapy.  . busPIRone (BUSPAR) 5 MG tablet Take 5 mg by mouth 2 (two) times daily.  . divalproex (DEPAKOTE) 250 MG DR tablet Take 250 mg by mouth at bedtime.   . divalproex (DEPAKOTE) 500 MG DR tablet Take 500 mg by mouth every morning.   . docusate sodium (COLACE) 100 MG capsule Take 200 mg by mouth daily. May increase to 2 capsules in the am and 2 capsules in the pm. If no BM, call the [facility].  . donepezil (ARICEPT) 10 MG tablet Take 10 mg by mouth at bedtime.   . fam-trastuzumab deruxtecan-nxki 5.4 mg/kg in dextrose 5 % 100 mL Inject 5.4 mg/kg into the vein every 21 ( twenty-one) days.  . memantine (NAMENDA) 10 MG tablet Take 10 mg by mouth 2 (two) times daily.  . mirtazapine (REMERON) 7.5 MG tablet Take 7.5 mg by mouth at bedtime.  Marland Kitchen QUEtiapine (SEROQUEL) 25 MG tablet Take 25 mg by mouth 2 (two) times daily.   . sertraline (ZOLOFT) 100 MG tablet Take 100 mg by mouth daily.   Marland Kitchen VITAMIN D, CHOLECALCIFEROL, PO Take  2,000 Int'l Units by mouth daily.  Marland Kitchen acetaminophen (TYLENOL) 325 MG tablet Take 650 mg by mouth every 6 (six) hours as needed for mild pain.  Marland Kitchen HYDROcodone-acetaminophen (NORCO/VICODIN) 5-325 MG tablet Take 1 tablet by mouth every 6 (six) hours as needed.   . ondansetron (ZOFRAN) 4 MG tablet Take 1 tablet (4 mg total) by mouth every 8 (eight) hours as needed for nausea or vomiting.  . [DISCONTINUED] ondansetron (ZOFRAN) 4 MG tablet Take 4 mg by mouth every 8 (eight) hours as needed for nausea or vomiting.  . [DISCONTINUED] prochlorperazine (COMPAZINE) 10 MG tablet Take 1 tablet (10 mg total) by mouth every 6 (six) hours as needed (Nausea or vomiting).   No facility-administered encounter medications on file as of 06/12/2019.     ALLERGIES:  No Known Allergies   PHYSICAL EXAM:  ECOG Performance status: 1  Vitals:   06/12/19 0917  BP: 138/64  Pulse: 67  Resp: 18  Temp: (!) 97.5 F (36.4 C)  SpO2: 98%   Filed Weights   06/12/19 0917  Weight: 149 lb 11.2 oz (67.9 kg)    Physical Exam Constitutional:      Appearance: Normal appearance. She is normal weight.  Cardiovascular:     Rate and Rhythm: Normal rate and regular rhythm.     Heart sounds: Normal heart sounds.  Pulmonary:     Effort: Pulmonary effort is normal.     Breath sounds: Normal breath sounds.  Abdominal:     General: Bowel sounds are normal.     Palpations: Abdomen is soft.  Musculoskeletal: Normal range of motion.  Skin:    General: Skin is warm.  Neurological:     Mental Status: She is alert and oriented to person, place, and time. Mental status is at baseline.  Psychiatric:        Mood and Affect: Mood normal.        Behavior: Behavior normal.        Thought Content: Thought content normal.        Judgment: Judgment normal.      LABORATORY DATA:  I have reviewed the labs as listed.  CBC    Component Value Date/Time   WBC 7.9 06/12/2019 0845   RBC 4.52 06/12/2019 0845   HGB 12.4 06/12/2019  0845   HCT 40.9 06/12/2019 0845   PLT 228 06/12/2019 0845  MCV 90.5 06/12/2019 0845   MCH 27.4 06/12/2019 0845   MCHC 30.3 06/12/2019 0845   RDW 21.6 (H) 06/12/2019 0845   LYMPHSABS 1.4 06/12/2019 0845   MONOABS 0.4 06/12/2019 0845   EOSABS 0.1 06/12/2019 0845   BASOSABS 0.0 06/12/2019 0845   CMP Latest Ref Rng & Units 06/12/2019 05/22/2019 05/08/2019  Glucose 70 - 99 mg/dL 88 84 100(H)  BUN 8 - 23 mg/dL '11 12 13  ' Creatinine 0.44 - 1.00 mg/dL 0.50 0.62 0.79  Sodium 135 - 145 mmol/L 140 139 139  Potassium 3.5 - 5.1 mmol/L 4.1 4.3 4.0  Chloride 98 - 111 mmol/L 105 103 101  CO2 22 - 32 mmol/L '25 26 25  ' Calcium 8.9 - 10.3 mg/dL 9.1 9.2 9.2  Total Protein 6.5 - 8.1 g/dL 7.2 8.0 7.8  Total Bilirubin 0.3 - 1.2 mg/dL 0.1(L) 0.3 0.6  Alkaline Phos 38 - 126 U/L 113 105 162(H)  AST 15 - 41 U/L '24 27 27  ' ALT 0 - 44 U/L '17 19 18   ' I personally performed a face-to-face visit.  All questions were answered to patient's stated satisfaction. Encouraged patient to call with any new concerns or questions before his next visit to the cancer center and we can certain see him sooner, if needed.     ASSESSMENT & PLAN:   Malignant neoplasm of overlapping sites of left female breast ([place]) 1.  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 - PET/CT scan on 05/11/2018 shows bilateral hypermetabolic pulmonary nodules consistent with pulmonary metastasis with no metastatic disease outside of the thorax. - On 05/23/2018 she had a lung biopsy shows poorly differentiated metastatic carcinoma, consistent with breast primary. -6 cycles of THP from 06/05/2018-09/18/2018. - PET scan on 09/29/2018 showed marked reduction in size and metabolic activity of bilateral pulmonary nodules and breast mass and adenopathy. -Maintenance Herceptin and Pertuzumab from  10/19/2018-12/26/2018 with progression. -MUGA scan on 11/30/2018 showed EF 61% - PET scan on 01/01/2019 showed progressive disease in the left breast primary, left axillary nodule and bilateral pulmonary metastasis. - She was switched to Kadcyla in the second line setting. -4 cycles of Kadcyla from 01/26/2019 through 04/06/2019 with progression. -PET scan on 04/09/2019 showed 6.7 x 5.9 cm left breast mass, left axillary lymph node, left upper lobe lung mass, right lower lobe lung mass, all have increased.  No bony metastasis. -Echocardiogram on 04/24/2019 showed EF of 60 to 65%. -MRI brain negative. -Patient was switched to Trastuzumab Deruxtecan on 05/01/2019. -She tolerated the medication very well. -Physical assessment today shows significant improvement in the left breast erythema and mass. -She will proceed with her third cycle today.  Labs are stable. -She will follow-up in 3 weeks with office visit, labs and treatment.  2.  Left breast pain: -She will use hydrocodone 10 mg as needed.         Orders placed this encounter:  No orders of the defined types were placed in this encounter.     Francene Finders, FNP-C [place] [phone]

## 2019-06-12 NOTE — Progress Notes (Signed)
Labs reviewed with Francene Finders NP this morning. Will proceed with treatment today as planned.  Before discharge verbal order to give Zofran 4mg  PO per Francene Finders NP.   Treatment given per orders. Patient tolerated it well without problems. Vitals stable and discharged home from clinic ambulatory with caregiver at side. Follow up as scheduled.

## 2019-06-13 DIAGNOSIS — F0390 Unspecified dementia without behavioral disturbance: Secondary | ICD-10-CM | POA: Diagnosis not present

## 2019-06-13 DIAGNOSIS — Z741 Need for assistance with personal care: Secondary | ICD-10-CM | POA: Diagnosis not present

## 2019-06-13 DIAGNOSIS — R410 Disorientation, unspecified: Secondary | ICD-10-CM | POA: Diagnosis not present

## 2019-06-14 ENCOUNTER — Inpatient Hospital Stay (HOSPITAL_COMMUNITY): Payer: Medicare Other

## 2019-06-14 ENCOUNTER — Other Ambulatory Visit: Payer: Self-pay

## 2019-06-14 VITALS — BP 103/53 | HR 71 | Temp 97.8°F | Resp 18

## 2019-06-14 DIAGNOSIS — Z5189 Encounter for other specified aftercare: Secondary | ICD-10-CM | POA: Diagnosis not present

## 2019-06-14 DIAGNOSIS — C50812 Malignant neoplasm of overlapping sites of left female breast: Secondary | ICD-10-CM

## 2019-06-14 DIAGNOSIS — C7802 Secondary malignant neoplasm of left lung: Secondary | ICD-10-CM | POA: Diagnosis not present

## 2019-06-14 DIAGNOSIS — C7801 Secondary malignant neoplasm of right lung: Secondary | ICD-10-CM | POA: Diagnosis not present

## 2019-06-14 DIAGNOSIS — R410 Disorientation, unspecified: Secondary | ICD-10-CM | POA: Diagnosis not present

## 2019-06-14 DIAGNOSIS — F0390 Unspecified dementia without behavioral disturbance: Secondary | ICD-10-CM | POA: Diagnosis not present

## 2019-06-14 DIAGNOSIS — Z741 Need for assistance with personal care: Secondary | ICD-10-CM | POA: Diagnosis not present

## 2019-06-14 DIAGNOSIS — Z171 Estrogen receptor negative status [ER-]: Secondary | ICD-10-CM

## 2019-06-14 DIAGNOSIS — Z5112 Encounter for antineoplastic immunotherapy: Secondary | ICD-10-CM | POA: Diagnosis not present

## 2019-06-14 DIAGNOSIS — C50912 Malignant neoplasm of unspecified site of left female breast: Secondary | ICD-10-CM

## 2019-06-14 MED ORDER — PEGFILGRASTIM-JMDB 6 MG/0.6ML ~~LOC~~ SOSY
6.0000 mg | PREFILLED_SYRINGE | Freq: Once | SUBCUTANEOUS | Status: AC
  Administered 2019-06-14: 6 mg via SUBCUTANEOUS
  Filled 2019-06-14: qty 0.6

## 2019-06-14 NOTE — Progress Notes (Signed)
[name] [name] presents today for injection per MD orders. Fulphila administered SQ in right Upper Arm. Administration without incident. Patient tolerated well.   Vital signs stable. No complaints at this time. Discharged from clinic ambulatory. F/U with [facility] as scheduled.

## 2019-06-14 NOTE — Patient Instructions (Signed)
[facility] at [facility]  Discharge Instructions:   _______________________________________________________________  Thank you for choosing [facility] at [facility] to provide your oncology and hematology care.  To afford each patient quality time with our providers, please arrive at least 15 minutes before your scheduled appointment.  You need to re-schedule your appointment if you arrive 10 or more minutes late.  We strive to give you quality time with our providers, and arriving late affects you and other patients whose appointments are after yours.  Also, if you no show three or more times for appointments you may be dismissed from the clinic.  Again, thank you for choosing [facility] at [facility]. Our hope is that these requests will allow you access to exceptional care and in a timely manner. _______________________________________________________________  If you have questions after your visit, please contact our office at [phone] between the hours of 8:30 a.m. and 5:00 p.m. Voicemails left after 4:30 p.m. will not be returned until the following business day. _______________________________________________________________  For prescription refill requests, have your pharmacy contact our office. _______________________________________________________________  Recommendations made by the consultant and any test results will be sent to your referring physician. _______________________________________________________________ 

## 2019-06-15 DIAGNOSIS — F0390 Unspecified dementia without behavioral disturbance: Secondary | ICD-10-CM | POA: Diagnosis not present

## 2019-06-15 DIAGNOSIS — R410 Disorientation, unspecified: Secondary | ICD-10-CM | POA: Diagnosis not present

## 2019-06-15 DIAGNOSIS — Z741 Need for assistance with personal care: Secondary | ICD-10-CM | POA: Diagnosis not present

## 2019-06-16 DIAGNOSIS — M6281 Muscle weakness (generalized): Secondary | ICD-10-CM | POA: Diagnosis not present

## 2019-06-16 DIAGNOSIS — E559 Vitamin D deficiency, unspecified: Secondary | ICD-10-CM | POA: Diagnosis not present

## 2019-06-16 DIAGNOSIS — I10 Essential (primary) hypertension: Secondary | ICD-10-CM | POA: Diagnosis not present

## 2019-06-16 DIAGNOSIS — E639 Nutritional deficiency, unspecified: Secondary | ICD-10-CM | POA: Diagnosis not present

## 2019-06-19 DIAGNOSIS — Z741 Need for assistance with personal care: Secondary | ICD-10-CM | POA: Diagnosis not present

## 2019-06-19 DIAGNOSIS — F0390 Unspecified dementia without behavioral disturbance: Secondary | ICD-10-CM | POA: Diagnosis not present

## 2019-06-19 DIAGNOSIS — R410 Disorientation, unspecified: Secondary | ICD-10-CM | POA: Diagnosis not present

## 2019-06-20 DIAGNOSIS — F0390 Unspecified dementia without behavioral disturbance: Secondary | ICD-10-CM | POA: Diagnosis not present

## 2019-06-20 DIAGNOSIS — R410 Disorientation, unspecified: Secondary | ICD-10-CM | POA: Diagnosis not present

## 2019-06-20 DIAGNOSIS — Z741 Need for assistance with personal care: Secondary | ICD-10-CM | POA: Diagnosis not present

## 2019-06-21 DIAGNOSIS — R410 Disorientation, unspecified: Secondary | ICD-10-CM | POA: Diagnosis not present

## 2019-06-21 DIAGNOSIS — Z741 Need for assistance with personal care: Secondary | ICD-10-CM | POA: Diagnosis not present

## 2019-06-21 DIAGNOSIS — F0390 Unspecified dementia without behavioral disturbance: Secondary | ICD-10-CM | POA: Diagnosis not present

## 2019-06-22 ENCOUNTER — Telehealth (HOSPITAL_COMMUNITY): Payer: Self-pay | Admitting: *Deleted

## 2019-06-22 DIAGNOSIS — Z741 Need for assistance with personal care: Secondary | ICD-10-CM | POA: Diagnosis not present

## 2019-06-22 DIAGNOSIS — R410 Disorientation, unspecified: Secondary | ICD-10-CM | POA: Diagnosis not present

## 2019-06-22 DIAGNOSIS — F0390 Unspecified dementia without behavioral disturbance: Secondary | ICD-10-CM | POA: Diagnosis not present

## 2019-06-23 DIAGNOSIS — R410 Disorientation, unspecified: Secondary | ICD-10-CM | POA: Diagnosis not present

## 2019-06-23 DIAGNOSIS — Z741 Need for assistance with personal care: Secondary | ICD-10-CM | POA: Diagnosis not present

## 2019-06-23 DIAGNOSIS — F0390 Unspecified dementia without behavioral disturbance: Secondary | ICD-10-CM | POA: Diagnosis not present

## 2019-06-25 DIAGNOSIS — Z23 Encounter for immunization: Secondary | ICD-10-CM | POA: Diagnosis not present

## 2019-06-26 DIAGNOSIS — G309 Alzheimer's disease, unspecified: Secondary | ICD-10-CM | POA: Diagnosis not present

## 2019-06-26 DIAGNOSIS — R54 Age-related physical debility: Secondary | ICD-10-CM | POA: Diagnosis not present

## 2019-06-26 DIAGNOSIS — Z741 Need for assistance with personal care: Secondary | ICD-10-CM | POA: Diagnosis not present

## 2019-06-26 DIAGNOSIS — R2689 Other abnormalities of gait and mobility: Secondary | ICD-10-CM | POA: Diagnosis not present

## 2019-06-26 DIAGNOSIS — C50812 Malignant neoplasm of overlapping sites of left female breast: Secondary | ICD-10-CM | POA: Diagnosis not present

## 2019-06-26 DIAGNOSIS — M6281 Muscle weakness (generalized): Secondary | ICD-10-CM | POA: Diagnosis not present

## 2019-06-26 DIAGNOSIS — R262 Difficulty in walking, not elsewhere classified: Secondary | ICD-10-CM | POA: Diagnosis not present

## 2019-06-27 DIAGNOSIS — Z741 Need for assistance with personal care: Secondary | ICD-10-CM | POA: Diagnosis not present

## 2019-06-27 DIAGNOSIS — M6281 Muscle weakness (generalized): Secondary | ICD-10-CM | POA: Diagnosis not present

## 2019-06-27 DIAGNOSIS — Z1159 Encounter for screening for other viral diseases: Secondary | ICD-10-CM | POA: Diagnosis not present

## 2019-06-27 DIAGNOSIS — R54 Age-related physical debility: Secondary | ICD-10-CM | POA: Diagnosis not present

## 2019-06-27 DIAGNOSIS — G309 Alzheimer's disease, unspecified: Secondary | ICD-10-CM | POA: Diagnosis not present

## 2019-06-27 DIAGNOSIS — C50812 Malignant neoplasm of overlapping sites of left female breast: Secondary | ICD-10-CM | POA: Diagnosis not present

## 2019-06-27 DIAGNOSIS — R2689 Other abnormalities of gait and mobility: Secondary | ICD-10-CM | POA: Diagnosis not present

## 2019-06-28 DIAGNOSIS — G309 Alzheimer's disease, unspecified: Secondary | ICD-10-CM | POA: Diagnosis not present

## 2019-06-28 DIAGNOSIS — C50812 Malignant neoplasm of overlapping sites of left female breast: Secondary | ICD-10-CM | POA: Diagnosis not present

## 2019-06-28 DIAGNOSIS — M6281 Muscle weakness (generalized): Secondary | ICD-10-CM | POA: Diagnosis not present

## 2019-06-28 DIAGNOSIS — R54 Age-related physical debility: Secondary | ICD-10-CM | POA: Diagnosis not present

## 2019-06-28 DIAGNOSIS — Z741 Need for assistance with personal care: Secondary | ICD-10-CM | POA: Diagnosis not present

## 2019-06-28 DIAGNOSIS — R2689 Other abnormalities of gait and mobility: Secondary | ICD-10-CM | POA: Diagnosis not present

## 2019-06-29 DIAGNOSIS — Z741 Need for assistance with personal care: Secondary | ICD-10-CM | POA: Diagnosis not present

## 2019-06-29 DIAGNOSIS — R54 Age-related physical debility: Secondary | ICD-10-CM | POA: Diagnosis not present

## 2019-06-29 DIAGNOSIS — G309 Alzheimer's disease, unspecified: Secondary | ICD-10-CM | POA: Diagnosis not present

## 2019-06-29 DIAGNOSIS — R2689 Other abnormalities of gait and mobility: Secondary | ICD-10-CM | POA: Diagnosis not present

## 2019-06-29 DIAGNOSIS — M6281 Muscle weakness (generalized): Secondary | ICD-10-CM | POA: Diagnosis not present

## 2019-06-29 DIAGNOSIS — C50812 Malignant neoplasm of overlapping sites of left female breast: Secondary | ICD-10-CM | POA: Diagnosis not present

## 2019-06-30 DIAGNOSIS — G309 Alzheimer's disease, unspecified: Secondary | ICD-10-CM | POA: Diagnosis not present

## 2019-06-30 DIAGNOSIS — Z741 Need for assistance with personal care: Secondary | ICD-10-CM | POA: Diagnosis not present

## 2019-06-30 DIAGNOSIS — C50812 Malignant neoplasm of overlapping sites of left female breast: Secondary | ICD-10-CM | POA: Diagnosis not present

## 2019-06-30 DIAGNOSIS — R54 Age-related physical debility: Secondary | ICD-10-CM | POA: Diagnosis not present

## 2019-06-30 DIAGNOSIS — M6281 Muscle weakness (generalized): Secondary | ICD-10-CM | POA: Diagnosis not present

## 2019-06-30 DIAGNOSIS — R2689 Other abnormalities of gait and mobility: Secondary | ICD-10-CM | POA: Diagnosis not present

## 2019-07-03 ENCOUNTER — Other Ambulatory Visit (HOSPITAL_COMMUNITY): Payer: Medicare Other

## 2019-07-03 ENCOUNTER — Ambulatory Visit (HOSPITAL_COMMUNITY): Payer: Medicare Other | Admitting: Hematology

## 2019-07-03 ENCOUNTER — Ambulatory Visit (HOSPITAL_COMMUNITY): Payer: Medicare Other

## 2019-07-03 DIAGNOSIS — Z741 Need for assistance with personal care: Secondary | ICD-10-CM | POA: Diagnosis not present

## 2019-07-03 DIAGNOSIS — G309 Alzheimer's disease, unspecified: Secondary | ICD-10-CM | POA: Diagnosis not present

## 2019-07-03 DIAGNOSIS — M6281 Muscle weakness (generalized): Secondary | ICD-10-CM | POA: Diagnosis not present

## 2019-07-03 DIAGNOSIS — R54 Age-related physical debility: Secondary | ICD-10-CM | POA: Diagnosis not present

## 2019-07-03 DIAGNOSIS — R2689 Other abnormalities of gait and mobility: Secondary | ICD-10-CM | POA: Diagnosis not present

## 2019-07-03 DIAGNOSIS — C50812 Malignant neoplasm of overlapping sites of left female breast: Secondary | ICD-10-CM | POA: Diagnosis not present

## 2019-07-04 DIAGNOSIS — Z741 Need for assistance with personal care: Secondary | ICD-10-CM | POA: Diagnosis not present

## 2019-07-04 DIAGNOSIS — M6281 Muscle weakness (generalized): Secondary | ICD-10-CM | POA: Diagnosis not present

## 2019-07-04 DIAGNOSIS — R54 Age-related physical debility: Secondary | ICD-10-CM | POA: Diagnosis not present

## 2019-07-04 DIAGNOSIS — R2689 Other abnormalities of gait and mobility: Secondary | ICD-10-CM | POA: Diagnosis not present

## 2019-07-04 DIAGNOSIS — C50812 Malignant neoplasm of overlapping sites of left female breast: Secondary | ICD-10-CM | POA: Diagnosis not present

## 2019-07-04 DIAGNOSIS — G309 Alzheimer's disease, unspecified: Secondary | ICD-10-CM | POA: Diagnosis not present

## 2019-07-05 ENCOUNTER — Ambulatory Visit (HOSPITAL_COMMUNITY): Payer: Medicare Other

## 2019-07-05 DIAGNOSIS — Z741 Need for assistance with personal care: Secondary | ICD-10-CM | POA: Diagnosis not present

## 2019-07-05 DIAGNOSIS — R2689 Other abnormalities of gait and mobility: Secondary | ICD-10-CM | POA: Diagnosis not present

## 2019-07-05 DIAGNOSIS — R54 Age-related physical debility: Secondary | ICD-10-CM | POA: Diagnosis not present

## 2019-07-05 DIAGNOSIS — C50812 Malignant neoplasm of overlapping sites of left female breast: Secondary | ICD-10-CM | POA: Diagnosis not present

## 2019-07-05 DIAGNOSIS — G309 Alzheimer's disease, unspecified: Secondary | ICD-10-CM | POA: Diagnosis not present

## 2019-07-05 DIAGNOSIS — M6281 Muscle weakness (generalized): Secondary | ICD-10-CM | POA: Diagnosis not present

## 2019-07-06 DIAGNOSIS — G309 Alzheimer's disease, unspecified: Secondary | ICD-10-CM | POA: Diagnosis not present

## 2019-07-06 DIAGNOSIS — C50812 Malignant neoplasm of overlapping sites of left female breast: Secondary | ICD-10-CM | POA: Diagnosis not present

## 2019-07-06 DIAGNOSIS — R2689 Other abnormalities of gait and mobility: Secondary | ICD-10-CM | POA: Diagnosis not present

## 2019-07-06 DIAGNOSIS — R54 Age-related physical debility: Secondary | ICD-10-CM | POA: Diagnosis not present

## 2019-07-06 DIAGNOSIS — Z741 Need for assistance with personal care: Secondary | ICD-10-CM | POA: Diagnosis not present

## 2019-07-06 DIAGNOSIS — M6281 Muscle weakness (generalized): Secondary | ICD-10-CM | POA: Diagnosis not present

## 2019-07-07 DIAGNOSIS — G309 Alzheimer's disease, unspecified: Secondary | ICD-10-CM | POA: Diagnosis not present

## 2019-07-07 DIAGNOSIS — R54 Age-related physical debility: Secondary | ICD-10-CM | POA: Diagnosis not present

## 2019-07-07 DIAGNOSIS — M6281 Muscle weakness (generalized): Secondary | ICD-10-CM | POA: Diagnosis not present

## 2019-07-07 DIAGNOSIS — Z741 Need for assistance with personal care: Secondary | ICD-10-CM | POA: Diagnosis not present

## 2019-07-07 DIAGNOSIS — C50812 Malignant neoplasm of overlapping sites of left female breast: Secondary | ICD-10-CM | POA: Diagnosis not present

## 2019-07-07 DIAGNOSIS — R2689 Other abnormalities of gait and mobility: Secondary | ICD-10-CM | POA: Diagnosis not present

## 2019-07-09 DIAGNOSIS — M6281 Muscle weakness (generalized): Secondary | ICD-10-CM | POA: Diagnosis not present

## 2019-07-09 DIAGNOSIS — R54 Age-related physical debility: Secondary | ICD-10-CM | POA: Diagnosis not present

## 2019-07-09 DIAGNOSIS — R2689 Other abnormalities of gait and mobility: Secondary | ICD-10-CM | POA: Diagnosis not present

## 2019-07-09 DIAGNOSIS — C50812 Malignant neoplasm of overlapping sites of left female breast: Secondary | ICD-10-CM | POA: Diagnosis not present

## 2019-07-09 DIAGNOSIS — G309 Alzheimer's disease, unspecified: Secondary | ICD-10-CM | POA: Diagnosis not present

## 2019-07-09 DIAGNOSIS — Z741 Need for assistance with personal care: Secondary | ICD-10-CM | POA: Diagnosis not present

## 2019-07-10 DIAGNOSIS — G309 Alzheimer's disease, unspecified: Secondary | ICD-10-CM | POA: Diagnosis not present

## 2019-07-10 DIAGNOSIS — R54 Age-related physical debility: Secondary | ICD-10-CM | POA: Diagnosis not present

## 2019-07-10 DIAGNOSIS — R2689 Other abnormalities of gait and mobility: Secondary | ICD-10-CM | POA: Diagnosis not present

## 2019-07-10 DIAGNOSIS — C50812 Malignant neoplasm of overlapping sites of left female breast: Secondary | ICD-10-CM | POA: Diagnosis not present

## 2019-07-10 DIAGNOSIS — Z741 Need for assistance with personal care: Secondary | ICD-10-CM | POA: Diagnosis not present

## 2019-07-10 DIAGNOSIS — M6281 Muscle weakness (generalized): Secondary | ICD-10-CM | POA: Diagnosis not present

## 2019-07-11 DIAGNOSIS — M6281 Muscle weakness (generalized): Secondary | ICD-10-CM | POA: Diagnosis not present

## 2019-07-11 DIAGNOSIS — C50812 Malignant neoplasm of overlapping sites of left female breast: Secondary | ICD-10-CM | POA: Diagnosis not present

## 2019-07-11 DIAGNOSIS — G309 Alzheimer's disease, unspecified: Secondary | ICD-10-CM | POA: Diagnosis not present

## 2019-07-11 DIAGNOSIS — R54 Age-related physical debility: Secondary | ICD-10-CM | POA: Diagnosis not present

## 2019-07-11 DIAGNOSIS — Z741 Need for assistance with personal care: Secondary | ICD-10-CM | POA: Diagnosis not present

## 2019-07-11 DIAGNOSIS — R2689 Other abnormalities of gait and mobility: Secondary | ICD-10-CM | POA: Diagnosis not present

## 2019-07-12 DIAGNOSIS — Z741 Need for assistance with personal care: Secondary | ICD-10-CM | POA: Diagnosis not present

## 2019-07-12 DIAGNOSIS — F320 Major depressive disorder, single episode, mild: Secondary | ICD-10-CM | POA: Diagnosis not present

## 2019-07-12 DIAGNOSIS — E559 Vitamin D deficiency, unspecified: Secondary | ICD-10-CM | POA: Diagnosis not present

## 2019-07-12 DIAGNOSIS — I10 Essential (primary) hypertension: Secondary | ICD-10-CM | POA: Diagnosis not present

## 2019-07-12 DIAGNOSIS — R630 Anorexia: Secondary | ICD-10-CM | POA: Diagnosis not present

## 2019-07-12 DIAGNOSIS — G309 Alzheimer's disease, unspecified: Secondary | ICD-10-CM | POA: Diagnosis not present

## 2019-07-12 DIAGNOSIS — R2689 Other abnormalities of gait and mobility: Secondary | ICD-10-CM | POA: Diagnosis not present

## 2019-07-12 DIAGNOSIS — R54 Age-related physical debility: Secondary | ICD-10-CM | POA: Diagnosis not present

## 2019-07-12 DIAGNOSIS — M6281 Muscle weakness (generalized): Secondary | ICD-10-CM | POA: Diagnosis not present

## 2019-07-12 DIAGNOSIS — C50812 Malignant neoplasm of overlapping sites of left female breast: Secondary | ICD-10-CM | POA: Diagnosis not present

## 2019-07-14 DIAGNOSIS — M6281 Muscle weakness (generalized): Secondary | ICD-10-CM | POA: Diagnosis not present

## 2019-07-14 DIAGNOSIS — R54 Age-related physical debility: Secondary | ICD-10-CM | POA: Diagnosis not present

## 2019-07-14 DIAGNOSIS — Z741 Need for assistance with personal care: Secondary | ICD-10-CM | POA: Diagnosis not present

## 2019-07-14 DIAGNOSIS — G309 Alzheimer's disease, unspecified: Secondary | ICD-10-CM | POA: Diagnosis not present

## 2019-07-14 DIAGNOSIS — R2689 Other abnormalities of gait and mobility: Secondary | ICD-10-CM | POA: Diagnosis not present

## 2019-07-14 DIAGNOSIS — C50812 Malignant neoplasm of overlapping sites of left female breast: Secondary | ICD-10-CM | POA: Diagnosis not present

## 2019-07-15 DIAGNOSIS — M6281 Muscle weakness (generalized): Secondary | ICD-10-CM | POA: Diagnosis not present

## 2019-07-15 DIAGNOSIS — G309 Alzheimer's disease, unspecified: Secondary | ICD-10-CM | POA: Diagnosis not present

## 2019-07-15 DIAGNOSIS — C50812 Malignant neoplasm of overlapping sites of left female breast: Secondary | ICD-10-CM | POA: Diagnosis not present

## 2019-07-15 DIAGNOSIS — R2689 Other abnormalities of gait and mobility: Secondary | ICD-10-CM | POA: Diagnosis not present

## 2019-07-15 DIAGNOSIS — Z741 Need for assistance with personal care: Secondary | ICD-10-CM | POA: Diagnosis not present

## 2019-07-15 DIAGNOSIS — R54 Age-related physical debility: Secondary | ICD-10-CM | POA: Diagnosis not present

## 2019-07-16 ENCOUNTER — Inpatient Hospital Stay (HOSPITAL_BASED_OUTPATIENT_CLINIC_OR_DEPARTMENT_OTHER): Payer: Medicare Other | Admitting: Hematology

## 2019-07-16 ENCOUNTER — Inpatient Hospital Stay (HOSPITAL_COMMUNITY): Payer: Medicare Other

## 2019-07-16 ENCOUNTER — Other Ambulatory Visit: Payer: Self-pay

## 2019-07-16 ENCOUNTER — Inpatient Hospital Stay (HOSPITAL_COMMUNITY): Payer: Medicare Other | Attending: Hematology

## 2019-07-16 ENCOUNTER — Encounter (HOSPITAL_COMMUNITY): Payer: Self-pay | Admitting: Hematology

## 2019-07-16 VITALS — BP 92/34 | HR 66 | Temp 97.4°F | Resp 18 | Wt 149.2 lb

## 2019-07-16 VITALS — BP 108/45 | HR 64 | Temp 97.5°F | Resp 18

## 2019-07-16 DIAGNOSIS — C50912 Malignant neoplasm of unspecified site of left female breast: Secondary | ICD-10-CM

## 2019-07-16 DIAGNOSIS — C7800 Secondary malignant neoplasm of unspecified lung: Secondary | ICD-10-CM | POA: Insufficient documentation

## 2019-07-16 DIAGNOSIS — C50812 Malignant neoplasm of overlapping sites of left female breast: Secondary | ICD-10-CM

## 2019-07-16 DIAGNOSIS — R54 Age-related physical debility: Secondary | ICD-10-CM | POA: Diagnosis not present

## 2019-07-16 DIAGNOSIS — Z5189 Encounter for other specified aftercare: Secondary | ICD-10-CM | POA: Insufficient documentation

## 2019-07-16 DIAGNOSIS — Z741 Need for assistance with personal care: Secondary | ICD-10-CM | POA: Diagnosis not present

## 2019-07-16 DIAGNOSIS — Z171 Estrogen receptor negative status [ER-]: Secondary | ICD-10-CM

## 2019-07-16 DIAGNOSIS — Z5112 Encounter for antineoplastic immunotherapy: Secondary | ICD-10-CM | POA: Insufficient documentation

## 2019-07-16 DIAGNOSIS — M6281 Muscle weakness (generalized): Secondary | ICD-10-CM | POA: Diagnosis not present

## 2019-07-16 DIAGNOSIS — G309 Alzheimer's disease, unspecified: Secondary | ICD-10-CM | POA: Diagnosis not present

## 2019-07-16 DIAGNOSIS — R2689 Other abnormalities of gait and mobility: Secondary | ICD-10-CM | POA: Diagnosis not present

## 2019-07-16 LAB — COMPREHENSIVE METABOLIC PANEL
ALT: 15 U/L (ref 0–44)
AST: 22 U/L (ref 15–41)
Albumin: 3.6 g/dL (ref 3.5–5.0)
Alkaline Phosphatase: 96 U/L (ref 38–126)
Anion gap: 11 (ref 5–15)
BUN: 14 mg/dL (ref 8–23)
CO2: 26 mmol/L (ref 22–32)
Calcium: 9.3 mg/dL (ref 8.9–10.3)
Chloride: 104 mmol/L (ref 98–111)
Creatinine, Ser: 0.58 mg/dL (ref 0.44–1.00)
GFR calc Af Amer: >60 mL/min (ref >60)
GFR calc non Af Amer: >60 mL/min (ref >60)
Glucose, Bld: 80 mg/dL (ref 70–99)
Potassium: 4.2 mmol/L (ref 3.5–5.1)
Sodium: 141 mmol/L (ref 135–145)
Total Bilirubin: 0.7 mg/dL (ref 0.3–1.2)
Total Protein: 7.6 g/dL (ref 6.5–8.1)

## 2019-07-16 LAB — CBC WITH DIFFERENTIAL/PLATELET
Abs Immature Granulocytes: 0.02 10*3/uL (ref 0.00–0.07)
Basophils Absolute: 0 10*3/uL (ref 0.0–0.1)
Basophils Relative: 1 %
Eosinophils Absolute: 0.1 10*3/uL (ref 0.0–0.5)
Eosinophils Relative: 1 %
HCT: 42.2 % (ref 36.0–46.0)
Hemoglobin: 13.1 g/dL (ref 12.0–15.0)
Immature Granulocytes: 0 %
Lymphocytes Relative: 21 %
Lymphs Abs: 1.7 10*3/uL (ref 0.7–4.0)
MCH: 29.4 pg (ref 26.0–34.0)
MCHC: 31 g/dL (ref 30.0–36.0)
MCV: 94.6 fL (ref 80.0–100.0)
Monocytes Absolute: 0.6 10*3/uL (ref 0.1–1.0)
Monocytes Relative: 7 %
Neutro Abs: 5.6 10*3/uL (ref 1.7–7.7)
Neutrophils Relative %: 70 %
Platelets: 224 10*3/uL (ref 150–400)
RBC: 4.46 MIL/uL (ref 3.87–5.11)
RDW: 19.7 % — ABNORMAL HIGH (ref 11.5–15.5)
WBC: 8.1 10*3/uL (ref 4.0–10.5)
nRBC: 0 % (ref 0.0–0.2)

## 2019-07-16 MED ORDER — SODIUM CHLORIDE 0.9 % IV BOLUS
Freq: Once | INTRAVENOUS | Status: AC
  Administered 2019-07-16: 12:00:00 via INTRAVENOUS
  Filled 2019-07-16: qty 1000

## 2019-07-16 MED ORDER — ACETAMINOPHEN 325 MG PO TABS
650.0000 mg | ORAL_TABLET | Freq: Once | ORAL | Status: AC
  Administered 2019-07-16: 650 mg via ORAL

## 2019-07-16 MED ORDER — DIPHENHYDRAMINE HCL 25 MG PO CAPS
50.0000 mg | ORAL_CAPSULE | Freq: Once | ORAL | Status: AC
  Administered 2019-07-16: 50 mg via ORAL

## 2019-07-16 MED ORDER — FAM-TRASTUZUMAB DERUXTECAN-NXKI CHEMO 100 MG IV SOLR
5.8000 mg/kg | Freq: Once | INTRAVENOUS | Status: AC
  Administered 2019-07-16: 400 mg via INTRAVENOUS
  Filled 2019-07-16: qty 20

## 2019-07-16 MED ORDER — ACETAMINOPHEN 325 MG PO TABS
ORAL_TABLET | ORAL | Status: AC
  Filled 2019-07-16: qty 2

## 2019-07-16 MED ORDER — DEXTROSE 5 % IV SOLN
Freq: Once | INTRAVENOUS | Status: AC
  Administered 2019-07-16: 11:00:00 via INTRAVENOUS

## 2019-07-16 MED ORDER — DIPHENHYDRAMINE HCL 25 MG PO CAPS
ORAL_CAPSULE | ORAL | Status: AC
  Filled 2019-07-16: qty 2

## 2019-07-16 MED ORDER — PALONOSETRON HCL INJECTION 0.25 MG/5ML
0.2500 mg | Freq: Once | INTRAVENOUS | Status: AC
  Administered 2019-07-16: 0.25 mg via INTRAVENOUS

## 2019-07-16 MED ORDER — SODIUM CHLORIDE 0.9 % IV SOLN
10.0000 mg | Freq: Once | INTRAVENOUS | Status: AC
  Administered 2019-07-16: 10 mg via INTRAVENOUS
  Filled 2019-07-16: qty 10

## 2019-07-16 MED ORDER — PALONOSETRON HCL INJECTION 0.25 MG/5ML
INTRAVENOUS | Status: AC
  Filled 2019-07-16: qty 5

## 2019-07-16 MED ORDER — SODIUM CHLORIDE 0.9% FLUSH
10.0000 mL | INTRAVENOUS | Status: DC | PRN
  Administered 2019-07-16: 10 mL
  Filled 2019-07-16: qty 10

## 2019-07-16 MED ORDER — HEPARIN SOD (PORK) LOCK FLUSH 100 UNIT/ML IV SOLN
500.0000 [IU] | Freq: Once | INTRAVENOUS | Status: AC | PRN
  Administered 2019-07-16: 500 [IU]

## 2019-07-16 NOTE — Patient Instructions (Signed)
[facility] Discharge Instructions for Patients Receiving Chemotherapy  Today you received the following chemotherapy agents   To help prevent nausea and vomiting after your treatment, we encourage you to take your nausea medication   If you develop nausea and vomiting that is not controlled by your nausea medication, call the clinic.   BELOW ARE SYMPTOMS THAT SHOULD BE REPORTED IMMEDIATELY:  *FEVER GREATER THAN 100.5 F  *CHILLS WITH OR WITHOUT FEVER  NAUSEA AND VOMITING THAT IS NOT CONTROLLED WITH YOUR NAUSEA MEDICATION  *UNUSUAL SHORTNESS OF BREATH  *UNUSUAL BRUISING OR BLEEDING  TENDERNESS IN MOUTH AND THROAT WITH OR WITHOUT PRESENCE OF ULCERS  *URINARY PROBLEMS  *BOWEL PROBLEMS  UNUSUAL RASH Items with * indicate a potential emergency and should be followed up as soon as possible.  Feel free to call the clinic should you have any questions or concerns. The clinic phone number is [phone].  Please show the CHEMO ALERT CARD at check-in to the Emergency Department and triage nurse.   

## 2019-07-16 NOTE — Patient Instructions (Addendum)
[facility] at [facility] Discharge Instructions  You were seen today by Dr. Delton Coombes. He went over your recent lab results. He will see you back in 3 weeks for labs, treatment, PET and follow up.   Thank you for choosing [facility] at [facility] to provide your oncology and hematology care.  To afford each patient quality time with our provider, please arrive at least 15 minutes before your scheduled appointment time.   If you have a lab appointment with the [facility] please come in thru the  Main Entrance and check in at the main information desk  You need to re-schedule your appointment should you arrive 10 or more minutes late.  We strive to give you quality time with our providers, and arriving late affects you and other patients whose appointments are after yours.  Also, if you no show three or more times for appointments you may be dismissed from the clinic at the providers discretion.     Again, thank you for choosing [facility].  Our hope is that these requests will decrease the amount of time that you wait before being seen by our physicians.       _____________________________________________________________  Should you have questions after your visit to [facility], please contact our office at [phone] between the hours of 8:00 a.m. and 4:30 p.m.  Voicemails left after 4:00 p.m. will not be returned until the following business day.  For prescription refill requests, have your pharmacy contact our office and allow 72 hours.    Cancer Center Support Programs:   > Cancer Support Group  2nd Tuesday of the month 1pm-2pm, Journey Room  \

## 2019-07-16 NOTE — Assessment & Plan Note (Addendum)
1.  Metastatic HER-2 positive left breast cancer to the lungs: -Biopsy of the left breast and left axillary lymph node showed invasive mammary carcinoma, ER/PR negative, Ki-67 50%, HER-2 positive by FISH. - 6 cycles of THP from 06/05/2018-09/18/2018 with maintenance Herceptin and Pertuzumab continued until 12/26/2026 progression. -4 cycles of Kadcyla from 01/26/2019-04/06/2019 with progression. - PET scan on 04/09/2019 showed 6.7 x 5.9 cm left breast mass, left axillary lymph node, left upper lobe lung mass, right lower lobe lung mass, although have increased.  No bone metastasis. - Echocardiogram on 04/24/2019 shows EF 60-65%.  MRI of the brain on 04/24/2019 did not show any evidence of metastatic disease. -3 cycles of trastuzumab Deruxtecan on from 05/01/2019 through 06/12/2019. -She does not report any shortness of breath on exertion.  Physical exam today reveals clear lungs on auscultation.  Left breast erythema is completely gone.  Left breast mass has mild improvement in size. -I reviewed her blood work.  We will proceed with cycle 4 today.  I plan to repeat a PET scan prior to next visit.  2.  Left breast pain: -She will use hydrocodone 10 mg as needed.

## 2019-07-16 NOTE — Progress Notes (Signed)
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      BRIEF ONCOLOGIC HISTORY:  Oncology History  Malignant neoplasm of overlapping sites of left female breast ([place])  05/15/2018 Initial Diagnosis   Malignant neoplasm of overlapping sites of left female breast ([place])   06/05/2018 - 01/17/2019 Chemotherapy   The patient had pegfilgrastim-cbqv (UDENYCA) injection 6 mg, 6 mg, Subcutaneous, Once, 6 of 6 cycles Administration: 6 mg (06/07/2018), 6 mg (06/28/2018), 6 mg (07/19/2018), 6 mg (08/30/2018), 6 mg (09/20/2018), 6 mg (08/10/2018) trastuzumab (HERCEPTIN) 600 mg in sodium chloride 0.9 % 250 mL chemo infusion, 609 mg, Intravenous,  Once, 10 of 13 cycles Administration: 600 mg (06/05/2018), 450 mg (06/26/2018), 450 mg (07/17/2018), 450 mg (08/28/2018), 450 mg (09/18/2018), 450 mg (08/07/2018), 450 mg (10/19/2018), 450 mg (11/09/2018), 450 mg (12/05/2018), 450 mg (12/26/2018) DOCEtaxel (TAXOTERE) 140 mg in sodium chloride 0.9 % 250 mL chemo infusion, 75 mg/m2 = 140 mg, Intravenous,  Once, 6 of 6 cycles Administration: 140 mg (06/05/2018), 140 mg (06/26/2018), 140 mg (07/17/2018), 140 mg (08/28/2018), 140 mg (09/18/2018), 140 mg (08/07/2018) ondansetron (ZOFRAN) 8 mg, dexamethasone (DECADRON) 4 mg in sodium chloride 0.9 % 50 mL IVPB, , Intravenous,  Once, 6 of 6 cycles Administration:  (06/05/2018),  (06/26/2018),  (07/17/2018),  (08/28/2018),  (09/18/2018),  (08/07/2018) pertuzumab (PERJETA) 840 mg in sodium chloride 0.9 % 250 mL chemo infusion, 840 mg, Intravenous, Once, 10 of 13 cycles Administration: 840 mg (06/05/2018), 420 mg (06/26/2018), 420 mg (07/17/2018), 420 mg (08/28/2018), 420 mg (09/18/2018), 420 mg (08/07/2018), 420 mg (10/19/2018), 420 mg  (11/09/2018), 420 mg (12/05/2018), 420 mg (12/26/2018)  for chemotherapy treatment.    05/01/2019 -  Chemotherapy   The patient had palonosetron (ALOXI) injection 0.25 mg, 0.25 mg, Intravenous,  Once, 4 of 6 cycles Administration: 0.25 mg (05/01/2019), 0.25 mg (05/22/2019), 0.25 mg (06/12/2019), 0.25 mg (07/16/2019) pegfilgrastim-jmdb (FULPHILA) injection 6 mg, 6 mg, Subcutaneous,  Once, 4 of 6 cycles Administration: 6 mg (05/03/2019), 6 mg (05/24/2019), 6 mg (06/14/2019) fam-trastuzumab deruxtecan-nxki (ENHERTU) 372 mg in dextrose 5 % 100 mL chemo infusion, 5.4 mg/kg = 372 mg, Intravenous,  Once, 4 of 6 cycles Administration: 372 mg (05/01/2019), 372 mg (05/22/2019), 400 mg (06/12/2019), 400 mg (07/16/2019)  for chemotherapy treatment.    HER2-positive carcinoma of left breast ([place])  05/30/2018 Initial Diagnosis   HER2-positive carcinoma of left breast ([place])   01/26/2019 - 04/26/2019 Chemotherapy   The patient had ado-trastuzumab emtansine (KADCYLA) 260 mg in sodium chloride 0.9 % 250 mL chemo infusion, 240 mg, Intravenous, Once, 4 of 5 cycles Administration: 260 mg (01/26/2019), 260 mg (02/16/2019), 260 mg (03/09/2019), 260 mg (04/06/2019)  for chemotherapy treatment.    05/01/2019 -  Chemotherapy   The patient had palonosetron (ALOXI) injection 0.25 mg, 0.25 mg, Intravenous,  Once, 4 of 6 cycles Administration: 0.25 mg (05/01/2019), 0.25 mg (05/22/2019), 0.25 mg (06/12/2019), 0.25 mg (07/16/2019) pegfilgrastim-jmdb (FULPHILA) injection 6 mg, 6 mg, Subcutaneous,  Once, 4 of 6 cycles Administration: 6 mg (05/03/2019), 6 mg (05/24/2019), 6 mg (06/14/2019) fam-trastuzumab deruxtecan-nxki (ENHERTU) 372 mg in dextrose 5 % 100 mL chemo infusion, 5.4 mg/kg = 372 mg, Intravenous,  Once, 4 of 6 cycles Administration: 372 mg (05/01/2019), 372 mg (05/22/2019), 400 mg (06/12/2019), 400  mg (07/16/2019)  for chemotherapy treatment.       CANCER STAGING: Cancer Staging No matching staging information was found for the patient.    INTERVAL HISTORY:  [name] [name] 63 y.o. female seen for follow-up of metastatic HER-2 positive left breast cancer.  She is tolerating her treatments very well.  Denies any worsening shortness of breath on exertion.  Denies any shortness of breath at rest.  Appetite and energy levels are 100%.  Denies any tingling or numbness in extremities.  No new onset pains reported.  No signs of PND or orthopnea.  REVIEW OF SYSTEMS:  Review of Systems  All other systems reviewed and are negative.    PAST MEDICAL/SURGICAL HISTORY:  Past Medical History:  Diagnosis Date  . Alzheimer's dementia ([place])   . Bradycardia   . Breast cancer ([place])   . Dementia ([place])   . Depression   . Hypokalemia   . Hypotension   . 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 ABDOMINAL HYSTERECTOMY    . BREAST BIOPSY Left   . PORTACATH PLACEMENT Right 05/15/2018   Procedure: INSERTION PORT-A-CATH;  Surgeon: Aviva Signs, MD;  Location: [facility];  Service: General;  Laterality: Right;     SOCIAL HISTORY:  Social History   Socioeconomic History  . Marital status: Widowed    Spouse name: Not on file  . Number of children: Not on file  . Years of education: Not on file  . Highest education level: Not on file  Occupational History  . Not on file  Social Needs  . Financial resource strain: Not on file  . Food insecurity    Worry: Not on file    Inability: Not on file  . Transportation needs    Medical: Not on file    Non-medical: Not on file  Tobacco Use  . Smoking status: Unknown If Ever Smoked  . Smokeless tobacco: Never Used  Substance and Sexual Activity  . Alcohol use: Not Currently  . Drug use: Never  . Sexual activity: Not Currently  Lifestyle  . Physical activity    Days per week: Not on file    Minutes per session: Not on file  . Stress: Not on file  Relationships  . Social Herbalist on phone: Not  on file    Gets together: Not on file    Attends religious service: Not on file    Active member of club or organization: Not on file    Attends meetings of clubs or organizations: Not on file    Relationship status: Not on file  . Intimate partner violence    Fear of current or ex partner: Not on file    Emotionally abused: Not on file    Physically abused: Not on file    Forced sexual activity: Not on file  Other Topics Concern  . Not on file  Social History Narrative   Lives at [facility] in [place]:  History reviewed. No pertinent family history.  CURRENT MEDICATIONS:  Outpatient Encounter Medications as of 07/16/2019  Medication Sig  . betamethasone dipropionate (DIPROLENE) 0.05 % cream 2 (two) times daily. Apply to left breast two times a day for dermatitis use on days that resident doesn't go for chemotherapy.  . busPIRone (BUSPAR) 5 MG tablet Take 5 mg by mouth 2 (two) times daily.  . divalproex (DEPAKOTE) 250 MG  DR tablet Take 250 mg by mouth at bedtime.   . divalproex (DEPAKOTE) 500 MG DR tablet Take 500 mg by mouth every morning.   . docusate sodium (COLACE) 100 MG capsule Take 200 mg by mouth daily. May increase to 2 capsules in the am and 2 capsules in the pm. If no BM, call the [facility].  . fam-trastuzumab deruxtecan-nxki 5.4 mg/kg in dextrose 5 % 100 mL Inject 5.4 mg/kg into the vein every 21 ( twenty-one) days.  . memantine (NAMENDA) 10 MG tablet Take 10 mg by mouth 2 (two) times daily.  . mirtazapine (REMERON) 7.5 MG tablet Take 7.5 mg by mouth at bedtime.  Marland Kitchen QUEtiapine (SEROQUEL) 25 MG tablet Take 25 mg by mouth 2 (two) times daily.   . sertraline (ZOLOFT) 100 MG tablet Take 100 mg by mouth daily.   Marland Kitchen VITAMIN D, CHOLECALCIFEROL, PO Take 2,000 Int'l Units by mouth daily.  Marland Kitchen acetaminophen (TYLENOL) 325 MG tablet Take 650 mg by mouth every 6 (six) hours as needed for mild pain.  Marland Kitchen ALPRAZolam (XANAX) 0.5 MG tablet Take 0.5 mg by mouth 3  (three) times daily as needed.   . donepezil (ARICEPT) 10 MG tablet Take 10 mg by mouth at bedtime.   Marland Kitchen HYDROcodone-acetaminophen (NORCO/VICODIN) 5-325 MG tablet Take 1 tablet by mouth every 6 (six) hours as needed.   . ondansetron (ZOFRAN) 4 MG tablet Take 1 tablet (4 mg total) by mouth every 8 (eight) hours as needed for nausea or vomiting. (Patient not taking: Reported on 07/16/2019)  . [DISCONTINUED] prochlorperazine (COMPAZINE) 10 MG tablet Take 1 tablet (10 mg total) by mouth every 6 (six) hours as needed (Nausea or vomiting).   No facility-administered encounter medications on file as of 07/16/2019.     ALLERGIES:  No Known Allergies   PHYSICAL EXAM:  ECOG Performance status: 1  Vitals:   07/16/19 0944  BP: (!) 92/34  Pulse: 66  Resp: 18  Temp: (!) 97.4 F (36.3 C)  SpO2: 99%   Filed Weights   07/16/19 0944  Weight: 149 lb 3.2 oz (67.7 kg)    Physical Exam Vitals signs reviewed.  Constitutional:      Appearance: Normal appearance.  Cardiovascular:     Rate and Rhythm: Normal rate and regular rhythm.     Heart sounds: Normal heart sounds.  Pulmonary:     Effort: Pulmonary effort is normal.     Breath sounds: Normal breath sounds.  Abdominal:     General: There is no distension.     Palpations: Abdomen is soft. There is no mass.  Musculoskeletal:        General: No swelling.  Skin:    General: Skin is warm.  Neurological:     General: No focal deficit present.     Mental Status: She is alert and oriented to person, place, and time.  Psychiatric:        Mood and Affect: Mood normal.        Behavior: Behavior normal.    Left breast erythema has improved.  Mass is stable.  LABORATORY DATA:  I have reviewed the labs as listed.  CBC    Component Value Date/Time   WBC 8.1 07/16/2019 0925   RBC 4.46 07/16/2019 0925   HGB 13.1 07/16/2019 0925   HCT 42.2 07/16/2019 0925   PLT 224 07/16/2019 0925   MCV 94.6 07/16/2019 0925   MCH 29.4 07/16/2019 0925    MCHC 31.0 07/16/2019 0925   RDW 19.7 (  H) 07/16/2019 0925   LYMPHSABS 1.7 07/16/2019 0925   MONOABS 0.6 07/16/2019 0925   EOSABS 0.1 07/16/2019 0925   BASOSABS 0.0 07/16/2019 0925   CMP Latest Ref Rng & Units 07/16/2019 06/12/2019 05/22/2019  Glucose 70 - 99 mg/dL 80 88 84  BUN 8 - 23 mg/dL _0 Creatinine 0.44 - 1.00 mg/dL 0.58 0.50 0.62  Sodium 135 - 145 mmol/L 141 140 139  Potassium 3.5 - 5.1 mmol/L 4.2 4.1 4.3  Chloride 98 - 111 mmol/L 104 105 103  CO2 22 - 32 mmol/L _1 Calcium 8.9 - 10.3 mg/dL 9.3 9.1 9.2  Total Protein 6.5 - 8.1 g/dL 7.6 7.2 8.0  Total Bilirubin 0.3 - 1.2 mg/dL 0.7 0.1(L) 0.3  Alkaline Phos 38 - 126 U/L 96 113 105  AST 15 - 41 U/L _2 ALT 0 - 44 U/L _3 DIAGNOSTIC IMAGING:  I have independently reviewed the scans and discussed with the patient.   I have reviewed Venita Lick LPN's note and agree with the documentation.  I personally performed a face-to-face visit, made revisions and my assessment and plan is as follows.    ASSESSMENT & PLAN:   HER2-positive carcinoma of left breast ([place]) 1.  Metastatic HER-2 positive left breast cancer to the lungs: -Biopsy of the left breast and left axillary lymph node showed invasive mammary carcinoma, ER/PR negative, Ki-67 50%, HER-2 positive by FISH. - 6 cycles of THP from 06/05/2018-09/18/2018 with maintenance Herceptin and Pertuzumab continued until 12/26/2026 progression. -4 cycles of Kadcyla from 01/26/2019-04/06/2019 with progression. - PET scan on 04/09/2019 showed 6.7 x 5.9 cm left breast mass, left axillary lymph node, left upper lobe lung mass, right lower lobe lung mass, although have increased.  No bone metastasis. - Echocardiogram on 04/24/2019 shows EF 60-65%.  MRI of the brain on 04/24/2019 did not show any evidence of metastatic disease. -3 cycles of trastuzumab Deruxtecan on from 05/01/2019 through 06/12/2019. -She does not report any shortness of breath on exertion.  Physical exam  today reveals clear lungs on auscultation.  Left breast erythema is completely gone.  Left breast mass has mild improvement in size. -I reviewed her blood work.  We will proceed with cycle 4 today.  I plan to repeat a PET scan prior to next visit.  2.  Left breast pain: -She will use hydrocodone 10 mg as needed.      Orders placed this encounter:  Orders Placed This Encounter  Procedures  . NM PET Image Restag (PS) Skull Base To Thigh  . CBC with Differential/Platelet  . Comprehensive metabolic panel      Derek Jack, MD [place] [phone]

## 2019-07-16 NOTE — Progress Notes (Signed)
Patient presents today for Enhertu. BP low upon arrival. Message received from [facility] LPN to proceed with treatment. Orders received to give 1 Liter of Normal Saline over an hour today with treatment. MAR reviewed. Pt has no complaints of pain or any changes since her last visit.    Treatment given today per MD orders. Tolerated infusion without adverse affects. Vital signs stable. No complaints at this time. Discharged from clinic ambulatory. Sitter from the [facility] at the bedside to take patient back to residence.  F/U with [facility] as scheduled.

## 2019-07-17 DIAGNOSIS — C50812 Malignant neoplasm of overlapping sites of left female breast: Secondary | ICD-10-CM | POA: Diagnosis not present

## 2019-07-17 DIAGNOSIS — G309 Alzheimer's disease, unspecified: Secondary | ICD-10-CM | POA: Diagnosis not present

## 2019-07-17 DIAGNOSIS — M6281 Muscle weakness (generalized): Secondary | ICD-10-CM | POA: Diagnosis not present

## 2019-07-17 DIAGNOSIS — R2689 Other abnormalities of gait and mobility: Secondary | ICD-10-CM | POA: Diagnosis not present

## 2019-07-17 DIAGNOSIS — R54 Age-related physical debility: Secondary | ICD-10-CM | POA: Diagnosis not present

## 2019-07-17 DIAGNOSIS — Z741 Need for assistance with personal care: Secondary | ICD-10-CM | POA: Diagnosis not present

## 2019-07-18 ENCOUNTER — Other Ambulatory Visit: Payer: Self-pay

## 2019-07-18 ENCOUNTER — Inpatient Hospital Stay (HOSPITAL_COMMUNITY): Payer: Medicare Other

## 2019-07-18 VITALS — BP 96/40 | HR 71 | Temp 97.1°F | Resp 18

## 2019-07-18 DIAGNOSIS — Z171 Estrogen receptor negative status [ER-]: Secondary | ICD-10-CM

## 2019-07-18 DIAGNOSIS — Z5189 Encounter for other specified aftercare: Secondary | ICD-10-CM | POA: Diagnosis not present

## 2019-07-18 DIAGNOSIS — C50912 Malignant neoplasm of unspecified site of left female breast: Secondary | ICD-10-CM

## 2019-07-18 DIAGNOSIS — Z5112 Encounter for antineoplastic immunotherapy: Secondary | ICD-10-CM | POA: Diagnosis not present

## 2019-07-18 DIAGNOSIS — C50812 Malignant neoplasm of overlapping sites of left female breast: Secondary | ICD-10-CM

## 2019-07-18 DIAGNOSIS — C7800 Secondary malignant neoplasm of unspecified lung: Secondary | ICD-10-CM | POA: Diagnosis not present

## 2019-07-18 MED ORDER — PEGFILGRASTIM-JMDB 6 MG/0.6ML ~~LOC~~ SOSY
6.0000 mg | PREFILLED_SYRINGE | Freq: Once | SUBCUTANEOUS | Status: AC
  Administered 2019-07-18: 6 mg via SUBCUTANEOUS
  Filled 2019-07-18: qty 0.6

## 2019-07-18 NOTE — Progress Notes (Signed)
[name] [name] presents today for Fulphila injection. VSS. Injection tolerated without incident or complaint. See MAR for details. Patient discharged in satisfactory condition with follow up instructions.

## 2019-07-18 NOTE — Patient Instructions (Addendum)
[facility] at [facility]  Discharge Instructions:  Fulphila injection received today. _______________________________________________________________  Thank you for choosing [facility] at [facility] to provide your oncology and hematology care.  To afford each patient quality time with our providers, please arrive at least 15 minutes before your scheduled appointment.  You need to re-schedule your appointment if you arrive 10 or more minutes late.  We strive to give you quality time with our providers, and arriving late affects you and other patients whose appointments are after yours.  Also, if you no show three or more times for appointments you may be dismissed from the clinic.  Again, thank you for choosing [facility] at [facility]. Our hope is that these requests will allow you access to exceptional care and in a timely manner. _______________________________________________________________  If you have questions after your visit, please contact our office at [phone] between the hours of 8:30 a.m. and 5:00 p.m. Voicemails left after 4:30 p.m. will not be returned until the following business day. _______________________________________________________________  For prescription refill requests, have your pharmacy contact our office. _______________________________________________________________  Recommendations made by the consultant and any test results will be sent to your referring physician. _______________________________________________________________ 

## 2019-07-23 DIAGNOSIS — Z1159 Encounter for screening for other viral diseases: Secondary | ICD-10-CM | POA: Diagnosis not present

## 2019-07-26 DIAGNOSIS — R630 Anorexia: Secondary | ICD-10-CM | POA: Diagnosis not present

## 2019-07-26 DIAGNOSIS — M6281 Muscle weakness (generalized): Secondary | ICD-10-CM | POA: Diagnosis not present

## 2019-07-26 DIAGNOSIS — E559 Vitamin D deficiency, unspecified: Secondary | ICD-10-CM | POA: Diagnosis not present

## 2019-07-30 ENCOUNTER — Ambulatory Visit (HOSPITAL_COMMUNITY)
Admission: RE | Admit: 2019-07-30 | Discharge: 2019-07-30 | Disposition: A | Discharge status: Home / Self Care | Payer: Medicare Other | Source: Ambulatory Visit | Attending: Hematology | Admitting: Hematology

## 2019-07-30 ENCOUNTER — Other Ambulatory Visit: Payer: Self-pay

## 2019-07-30 DIAGNOSIS — C50912 Malignant neoplasm of unspecified site of left female breast: Secondary | ICD-10-CM | POA: Diagnosis not present

## 2019-07-30 DIAGNOSIS — C7802 Secondary malignant neoplasm of left lung: Secondary | ICD-10-CM | POA: Diagnosis not present

## 2019-07-30 MED ORDER — FLUDEOXYGLUCOSE F - 18 (FDG) INJECTION
7.5300 | Freq: Once | INTRAVENOUS | Status: AC | PRN
  Administered 2019-07-30: 7.53 via INTRAVENOUS

## 2019-08-06 ENCOUNTER — Encounter (HOSPITAL_COMMUNITY): Payer: Self-pay | Admitting: Hematology

## 2019-08-06 ENCOUNTER — Inpatient Hospital Stay (HOSPITAL_COMMUNITY): Payer: Medicare Other

## 2019-08-06 ENCOUNTER — Other Ambulatory Visit: Payer: Self-pay

## 2019-08-06 ENCOUNTER — Inpatient Hospital Stay (HOSPITAL_COMMUNITY): Payer: Medicare Other | Attending: Hematology | Admitting: Hematology

## 2019-08-06 VITALS — BP 94/47 | HR 62 | Temp 96.9°F | Resp 18 | Wt 148.2 lb

## 2019-08-06 VITALS — BP 102/50 | HR 66 | Temp 96.9°F | Resp 18

## 2019-08-06 DIAGNOSIS — Z5112 Encounter for antineoplastic immunotherapy: Secondary | ICD-10-CM | POA: Diagnosis present

## 2019-08-06 DIAGNOSIS — Z5189 Encounter for other specified aftercare: Secondary | ICD-10-CM | POA: Insufficient documentation

## 2019-08-06 DIAGNOSIS — C50912 Malignant neoplasm of unspecified site of left female breast: Secondary | ICD-10-CM

## 2019-08-06 DIAGNOSIS — Z171 Estrogen receptor negative status [ER-]: Secondary | ICD-10-CM

## 2019-08-06 DIAGNOSIS — C50812 Malignant neoplasm of overlapping sites of left female breast: Secondary | ICD-10-CM

## 2019-08-06 LAB — COMPREHENSIVE METABOLIC PANEL
ALT: 16 U/L (ref 0–44)
AST: 23 U/L (ref 15–41)
Albumin: 3.4 g/dL — ABNORMAL LOW (ref 3.5–5.0)
Alkaline Phosphatase: 103 U/L (ref 38–126)
Anion gap: 9 (ref 5–15)
BUN: 11 mg/dL (ref 8–23)
CO2: 27 mmol/L (ref 22–32)
Calcium: 9 mg/dL (ref 8.9–10.3)
Chloride: 106 mmol/L (ref 98–111)
Creatinine, Ser: 0.53 mg/dL (ref 0.44–1.00)
GFR calc Af Amer: >60 mL/min (ref >60)
GFR calc non Af Amer: >60 mL/min (ref >60)
Glucose, Bld: 84 mg/dL (ref 70–99)
Potassium: 4 mmol/L (ref 3.5–5.1)
Sodium: 142 mmol/L (ref 135–145)
Total Bilirubin: 0.4 mg/dL (ref 0.3–1.2)
Total Protein: 7 g/dL (ref 6.5–8.1)

## 2019-08-06 LAB — CBC WITH DIFFERENTIAL/PLATELET
Abs Immature Granulocytes: 0.02 10*3/uL (ref 0.00–0.07)
Basophils Absolute: 0 10*3/uL (ref 0.0–0.1)
Basophils Relative: 1 %
Eosinophils Absolute: 0.1 10*3/uL (ref 0.0–0.5)
Eosinophils Relative: 1 %
HCT: 40.2 % (ref 36.0–46.0)
Hemoglobin: 12.3 g/dL (ref 12.0–15.0)
Immature Granulocytes: 0 %
Lymphocytes Relative: 21 %
Lymphs Abs: 1 10*3/uL (ref 0.7–4.0)
MCH: 29.4 pg (ref 26.0–34.0)
MCHC: 30.6 g/dL (ref 30.0–36.0)
MCV: 96.2 fL (ref 80.0–100.0)
Monocytes Absolute: 0.4 10*3/uL (ref 0.1–1.0)
Monocytes Relative: 9 %
Neutro Abs: 3.2 10*3/uL (ref 1.7–7.7)
Neutrophils Relative %: 68 %
Platelets: 199 10*3/uL (ref 150–400)
RBC: 4.18 MIL/uL (ref 3.87–5.11)
RDW: 16.9 % — ABNORMAL HIGH (ref 11.5–15.5)
WBC: 4.7 10*3/uL (ref 4.0–10.5)
nRBC: 0 % (ref 0.0–0.2)

## 2019-08-06 MED ORDER — FAM-TRASTUZUMAB DERUXTECAN-NXKI CHEMO 100 MG IV SOLR
5.8000 mg/kg | Freq: Once | INTRAVENOUS | Status: AC
  Administered 2019-08-06: 400 mg via INTRAVENOUS
  Filled 2019-08-06: qty 20

## 2019-08-06 MED ORDER — SODIUM CHLORIDE 0.9% FLUSH
10.0000 mL | INTRAVENOUS | Status: DC | PRN
  Administered 2019-08-06: 10 mL

## 2019-08-06 MED ORDER — HEPARIN SOD (PORK) LOCK FLUSH 100 UNIT/ML IV SOLN
500.0000 [IU] | Freq: Once | INTRAVENOUS | Status: AC | PRN
  Administered 2019-08-06: 500 [IU]

## 2019-08-06 MED ORDER — PALONOSETRON HCL INJECTION 0.25 MG/5ML
0.2500 mg | Freq: Once | INTRAVENOUS | Status: AC
  Administered 2019-08-06: 0.25 mg via INTRAVENOUS
  Filled 2019-08-06: qty 5

## 2019-08-06 MED ORDER — ACETAMINOPHEN 325 MG PO TABS
650.0000 mg | ORAL_TABLET | Freq: Once | ORAL | Status: AC
  Administered 2019-08-06: 10:00:00 650 mg via ORAL
  Filled 2019-08-06: qty 2

## 2019-08-06 MED ORDER — DIPHENHYDRAMINE HCL 25 MG PO CAPS
50.0000 mg | ORAL_CAPSULE | Freq: Once | ORAL | Status: AC
  Administered 2019-08-06: 10:00:00 50 mg via ORAL
  Filled 2019-08-06: qty 2

## 2019-08-06 MED ORDER — SODIUM CHLORIDE 0.9 % IV SOLN
10.0000 mg | Freq: Once | INTRAVENOUS | Status: AC
  Administered 2019-08-06: 10 mg via INTRAVENOUS
  Filled 2019-08-06: qty 10

## 2019-08-06 MED ORDER — DEXTROSE 5 % IV SOLN
Freq: Once | INTRAVENOUS | Status: AC
  Administered 2019-08-06: 09:00:00 via INTRAVENOUS

## 2019-08-06 NOTE — Progress Notes (Signed)
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)  05/15/2018 Initial Diagnosis   Malignant neoplasm of overlapping sites of left female breast ([place])   06/05/2018 - 01/17/2019 Chemotherapy   The patient had pegfilgrastim-cbqv (UDENYCA) injection 6 mg, 6 mg, Subcutaneous, Once, 6 of 6 cycles Administration: 6 mg (06/07/2018), 6 mg (06/28/2018), 6 mg (07/19/2018), 6 mg (08/30/2018), 6 mg (09/20/2018), 6 mg (08/10/2018) trastuzumab (HERCEPTIN) 600 mg in sodium chloride 0.9 % 250 mL chemo infusion, 609 mg, Intravenous,  Once, 10 of 13 cycles Administration: 600 mg (06/05/2018), 450 mg (06/26/2018), 450 mg (07/17/2018), 450 mg (08/28/2018), 450 mg (09/18/2018), 450 mg (08/07/2018), 450 mg (10/19/2018), 450 mg (11/09/2018), 450 mg (12/05/2018), 450 mg (12/26/2018) DOCEtaxel (TAXOTERE) 140 mg in sodium chloride 0.9 % 250 mL chemo infusion, 75 mg/m2 = 140 mg, Intravenous,  Once, 6 of 6 cycles Administration: 140 mg (06/05/2018), 140 mg (06/26/2018), 140 mg (07/17/2018), 140 mg (08/28/2018), 140 mg (09/18/2018), 140 mg (08/07/2018) ondansetron (ZOFRAN) 8 mg, dexamethasone (DECADRON) 4 mg in sodium chloride 0.9 % 50 mL IVPB, , Intravenous,  Once, 6 of 6 cycles Administration:  (06/05/2018),  (06/26/2018),  (07/17/2018),  (08/28/2018),  (09/18/2018),  (08/07/2018) pertuzumab (PERJETA) 840 mg in sodium chloride 0.9 % 250 mL chemo infusion, 840 mg, Intravenous, Once, 10 of 13 cycles Administration: 840 mg (06/05/2018), 420 mg (06/26/2018), 420 mg (07/17/2018), 420 mg (08/28/2018), 420 mg (09/18/2018), 420 mg (08/07/2018), 420 mg (10/19/2018), 420 mg  (11/09/2018), 420 mg (12/05/2018), 420 mg (12/26/2018)  for chemotherapy treatment.    05/01/2019 -  Chemotherapy   The patient had palonosetron (ALOXI) injection 0.25 mg, 0.25 mg, Intravenous,  Once, 5 of 6 cycles Administration: 0.25 mg (05/01/2019), 0.25 mg (05/22/2019), 0.25 mg (08/06/2019), 0.25 mg (06/12/2019), 0.25 mg (07/16/2019) pegfilgrastim-jmdb (FULPHILA) injection 6 mg, 6 mg, Subcutaneous,  Once, 5 of 6 cycles Administration: 6 mg (05/03/2019), 6 mg (05/24/2019), 6 mg (06/14/2019), 6 mg (07/18/2019), 6 mg (08/08/2019) fam-trastuzumab deruxtecan-nxki (ENHERTU) 372 mg in dextrose 5 % 100 mL chemo infusion, 5.4 mg/kg = 372 mg, Intravenous,  Once, 5 of 6 cycles Administration: 372 mg (05/01/2019), 372 mg (05/22/2019), 400 mg (08/06/2019), 400 mg (06/12/2019), 400 mg (07/16/2019)  for chemotherapy treatment.    HER2-positive carcinoma of left breast ([place])  05/30/2018 Initial Diagnosis   HER2-positive carcinoma of left breast ([place])   01/26/2019 - 04/26/2019 Chemotherapy   The patient had ado-trastuzumab emtansine (KADCYLA) 260 mg in sodium chloride 0.9 % 250 mL chemo infusion, 240 mg, Intravenous, Once, 4 of 5 cycles Administration: 260 mg (01/26/2019), 260 mg (02/16/2019), 260 mg (03/09/2019), 260 mg (04/06/2019)  for chemotherapy treatment.    05/01/2019 -  Chemotherapy   The patient had palonosetron (ALOXI) injection 0.25 mg, 0.25 mg, Intravenous,  Once, 5 of 6 cycles Administration: 0.25 mg (05/01/2019), 0.25 mg (05/22/2019), 0.25 mg (08/06/2019), 0.25 mg (06/12/2019), 0.25 mg (07/16/2019) pegfilgrastim-jmdb (FULPHILA) injection 6 mg, 6 mg, Subcutaneous,  Once, 5 of 6 cycles Administration: 6 mg (05/03/2019), 6 mg (05/24/2019), 6 mg (06/14/2019), 6 mg (07/18/2019), 6 mg (08/08/2019) fam-trastuzumab deruxtecan-nxki (ENHERTU) 372 mg in dextrose 5 % 100 mL chemo infusion, 5.4  mg/kg = 372 mg, Intravenous,  Once, 5 of 6 cycles Administration: 372 mg (05/01/2019), 372 mg (05/22/2019), 400 mg (08/06/2019), 400 mg  (06/12/2019), 400 mg (07/16/2019)  for chemotherapy treatment.       CANCER STAGING: Cancer Staging No matching staging information was found for the patient.   INTERVAL HISTORY:  [name] [name] 63 y.o. female seen for follow-up of metastatic HER-2 positive left breast cancer.  She denies any symptoms of PND or orthopnea.  She denies any shortness of breath on exertion.  Appetite is 100%.  Energy levels are 50%.  She had mild diarrhea which improved.  Denied any nausea or vomiting.  No new onset pains.  Breast pain has also improved.  Denies any fevers or infections.  REVIEW OF SYSTEMS:  Review of Systems  Gastrointestinal: Positive for diarrhea.  All other systems reviewed and are negative.    PAST MEDICAL/SURGICAL HISTORY:  Past Medical History:  Diagnosis Date  . Alzheimer's dementia ([place])   . Bradycardia   . Breast cancer ([place])   . Dementia ([place])   . Depression   . Hypokalemia   . Hypotension   . 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 ABDOMINAL HYSTERECTOMY    . BREAST BIOPSY Left   . PORTACATH PLACEMENT Right 05/15/2018   Procedure: INSERTION PORT-A-CATH;  Surgeon: Aviva Signs, MD;  Location: [facility];  Service: General;  Laterality: Right;     SOCIAL HISTORY:  Social History   Socioeconomic History  . Marital status: Widowed    Spouse name: Not on file  . Number of children: Not on file  . Years of education: Not on file  . Highest education level: Not on file  Occupational History  . Not on file  Tobacco Use  . Smoking status: Unknown If Ever Smoked  . Smokeless tobacco: Never Used  Substance and Sexual Activity  . Alcohol use: Not Currently  . Drug use: Never  . Sexual activity: Not Currently  Other Topics Concern  . Not on file  Social History Narrative   Lives at [facility] in [place] Strain:   .  Difficulty of Paying Living Expenses: Not on file  Food Insecurity:   . Worried About Charity fundraiser in the Last Year: Not on file  . Ran Out of Food in the Last Year: Not on file  Transportation Needs:   . Lack of Transportation (Medical): Not on file  . Lack of Transportation (Non-Medical): Not on file  Physical Activity:   . Days of Exercise per Week: Not on file  . Minutes of Exercise per Session: Not on file  Stress:   . Feeling of Stress : Not on file  Social Connections:   . Frequency of Communication with Friends and Family: Not on file  . Frequency of Social Gatherings with Friends and Family: Not on file  . Attends Religious Services: Not on file  . Active Member of Clubs or Organizations: Not on file  . Attends Archivist Meetings: Not on file  . Marital Status: Not on file  Intimate Partner Violence:   . Fear of Current or Ex-Partner: Not on file  . Emotionally Abused: Not on file  . Physically Abused: Not on file  . Sexually Abused: Not on file    FAMILY HISTORY:  History reviewed. No pertinent family history.  CURRENT  MEDICATIONS:  Outpatient Encounter Medications as of 08/06/2019  Medication Sig  . lidocaine-prilocaine (EMLA) cream Apply 1 application topically as needed (apply to port site topically every 21 days).  Marland Kitchen acetaminophen (TYLENOL) 325 MG tablet Take 650 mg by mouth every 6 (six) hours as needed for mild pain.  Marland Kitchen ALPRAZolam (XANAX) 0.5 MG tablet Take 0.5 mg by mouth 3 (three) times daily as needed.   . betamethasone dipropionate (DIPROLENE) 0.05 % cream 2 (two) times daily. Apply to left breast two times a day for dermatitis use on days that resident doesn't go for chemotherapy.  . busPIRone (BUSPAR) 5 MG tablet Take 5 mg by mouth 2 (two) times daily.  . divalproex (DEPAKOTE) 250 MG DR tablet Take 250 mg by mouth at bedtime.   . divalproex (DEPAKOTE) 500 MG DR tablet Take 500 mg by mouth every morning.   . docusate sodium (COLACE) 100 MG  capsule Take 200 mg by mouth daily. May increase to 2 capsules in the am and 2 capsules in the pm. If no BM, call the [facility].  . donepezil (ARICEPT) 10 MG tablet Take 10 mg by mouth at bedtime.   . fam-trastuzumab deruxtecan-nxki 5.4 mg/kg in dextrose 5 % 100 mL Inject 5.4 mg/kg into the vein every 21 ( twenty-one) days.  Marland Kitchen HYDROcodone-acetaminophen (NORCO/VICODIN) 5-325 MG tablet Take 1 tablet by mouth every 6 (six) hours as needed.   . memantine (NAMENDA) 10 MG tablet Take 10 mg by mouth 2 (two) times daily.  . mirtazapine (REMERON) 7.5 MG tablet Take 7.5 mg by mouth at bedtime.  . ondansetron (ZOFRAN) 4 MG tablet Take 1 tablet (4 mg total) by mouth every 8 (eight) hours as needed for nausea or vomiting.  Marland Kitchen QUEtiapine (SEROQUEL) 25 MG tablet Take 12.5 mg by mouth 3 (three) times daily.   . sertraline (ZOLOFT) 100 MG tablet Take 100 mg by mouth daily.   Marland Kitchen VITAMIN D, CHOLECALCIFEROL, PO Take 2,000 Int'l Units by mouth daily.  . [DISCONTINUED] prochlorperazine (COMPAZINE) 10 MG tablet Take 1 tablet (10 mg total) by mouth every 6 (six) hours as needed (Nausea or vomiting).   No facility-administered encounter medications on file as of 08/06/2019.    ALLERGIES:  No Known Allergies   PHYSICAL EXAM:  ECOG Performance status: 1  Vitals:   08/06/19 0832  BP: (!) 94/47  Pulse: 62  Resp: 18  Temp: (!) 96.9 F (36.1 C)  SpO2: 98%   Filed Weights   08/06/19 0832  Weight: 148 lb 3.2 oz (67.2 kg)    Physical Exam Vitals reviewed.  Constitutional:      Appearance: Normal appearance.  Cardiovascular:     Rate and Rhythm: Normal rate and regular rhythm.     Heart sounds: Normal heart sounds.  Pulmonary:     Effort: Pulmonary effort is normal.     Breath sounds: Normal breath sounds.  Abdominal:     General: There is no distension.     Palpations: Abdomen is soft. There is no mass.  Musculoskeletal:        General: No swelling.  Skin:    General: Skin is warm.    Neurological:     General: No focal deficit present.     Mental Status: She is alert and oriented to person, place, and time.  Psychiatric:        Mood and Affect: Mood normal.        Behavior: Behavior normal.    Left breast erythema has  improved.  Mass is stable.  LABORATORY DATA:  I have reviewed the labs as listed.  CBC    Component Value Date/Time   WBC 4.7 08/06/2019 0826   RBC 4.18 08/06/2019 0826   HGB 12.3 08/06/2019 0826   HCT 40.2 08/06/2019 0826   PLT 199 08/06/2019 0826   MCV 96.2 08/06/2019 0826   MCH 29.4 08/06/2019 0826   MCHC 30.6 08/06/2019 0826   RDW 16.9 (H) 08/06/2019 0826   LYMPHSABS 1.0 08/06/2019 0826   MONOABS 0.4 08/06/2019 0826   EOSABS 0.1 08/06/2019 0826   BASOSABS 0.0 08/06/2019 0826   CMP Latest Ref Rng & Units 08/06/2019 07/16/2019 06/12/2019  Glucose 70 - 99 mg/dL 84 80 88  BUN 8 - 23 mg/dL '11 14 11  ' Creatinine 0.44 - 1.00 mg/dL 0.53 0.58 0.50  Sodium 135 - 145 mmol/L 142 141 140  Potassium 3.5 - 5.1 mmol/L 4.0 4.2 4.1  Chloride 98 - 111 mmol/L 106 104 105  CO2 22 - 32 mmol/L '27 26 25  ' Calcium 8.9 - 10.3 mg/dL 9.0 9.3 9.1  Total Protein 6.5 - 8.1 g/dL 7.0 7.6 7.2  Total Bilirubin 0.3 - 1.2 mg/dL 0.4 0.7 0.1(L)  Alkaline Phos 38 - 126 U/L 103 96 113  AST 15 - 41 U/L '23 22 24  ' ALT 0 - 44 U/L '16 15 17       ' DIAGNOSTIC IMAGING:  I have independently reviewed the scans and discussed with the patient.   I have reviewed Venita Lick LPN's note and agree with the documentation.  I personally performed a face-to-face visit, made revisions and my assessment and plan is as follows.    ASSESSMENT & PLAN:   HER2-positive carcinoma of left breast ([place]) 1.  Metastatic HER-2 positive left breast cancer to the lungs: -Biopsy of the left breast and left axillary lymph node showed invasive mammary carcinoma, ER/PR negative, Ki-67 50%, HER-2 positive by FISH. - 6 cycles of THP from 06/05/2018-09/18/2018 with maintenance Herceptin and  Pertuzumab continued until 12/26/2026 progression. -4 cycles of Kadcyla from 01/26/2019-04/06/2019 with progression. - PET scan on 04/09/2019 showed 6.7 x 5.9 cm left breast mass, left axillary lymph node, left upper lobe lung mass, right lower lobe lung mass, although have increased.  No bone metastasis. - Echocardiogram on 04/24/2019 shows EF 60-65%.  MRI of the brain on 04/24/2019 did not show any evidence of metastatic disease. -4 cycles of trastuzumab Deruxtecan on from 05/01/2019 through 07/16/2019 -She does not report any shortness of breath on exertion.  Lungs are clear to auscultation.  Breast mass has also improved. -We discussed the results of the PET scan dated 07/30/2019 showed improving left breast mass.  Left axillary lymph node metastasis has essentially resolved.  Improved bilateral pulmonary metastasis. -She has gotten very good response.  I have recommended continuation of same dose of drug conjugated antibody. -I plan to see her back in 3 weeks for follow-up.  I plan to repeat 2D echocardiogram prior to next visit.  2.  Left breast pain: -This has resolved completely.  She has hydrocodone 10 mg to be taken as needed.  3.  Diarrhea: -This is chemotherapy-induced.  She will use Imodium as needed.      Orders placed this encounter:  Orders Placed This Encounter  Procedures  . CBC with Differential/Platelet  . Comprehensive metabolic panel  . Cancer antigen 15-3  . ECHOCARDIOGRAM COMPLETE      Derek Jack, [place] [phone]

## 2019-08-06 NOTE — Patient Instructions (Addendum)
[place] at [facility] Discharge Instructions  You were seen today by Dr. Delton Coombes. He went over your recent lab results. You will get treatment today. He will schedule you for a repeat ECHO prior to your next visit. He will see you back in 3 weeks for labs and follow up.   Thank you for choosing [place] at [facility] to provide your oncology and hematology care.  To afford each patient quality time with our provider, please arrive at least 15 minutes before your scheduled appointment time.   If you have a lab appointment with the [place] please come in thru the  Main Entrance and check in at the main information desk  You need to re-schedule your appointment should you arrive 10 or more minutes late.  We strive to give you quality time with our providers, and arriving late affects you and other patients whose appointments are after yours.  Also, if you no show three or more times for appointments you may be dismissed from the clinic at the providers discretion.     Again, thank you for choosing [facility].  Our hope is that these requests will decrease the amount of time that you wait before being seen by our physicians.       _____________________________________________________________  Should you have questions after your visit to [facility], please contact our office at [phone] between the hours of 8:00 a.m. and 4:30 p.m.  Voicemails left after 4:00 p.m. will not be returned until the following business day.  For prescription refill requests, have your pharmacy contact our office and allow 72 hours.    Cancer Center Support Programs:   > Cancer Support Group  2nd Tuesday of the month 1pm-2pm, Journey Room

## 2019-08-06 NOTE — Progress Notes (Signed)
To treatment room for oncology follow up and treatment today.  Caregiver at side.  No complaints voiced by the patient.  No s/s of distress noted.    Patient seen by Dr. Delton Coombes with lab review and ok to treat today verbal order.    Patient tolerated chemotherapy with no complaints voiced.  Port site clean and dry with no bruising or swelling noted at site.  Good blood return noted before and after administration of chemotherapy.  Band aid applied.  Patient left ambulatory with VSS and no s/s of distress noted.

## 2019-08-06 NOTE — Patient Instructions (Signed)
[facility] at [facility] Discharge Instructions  Labs drawn from portacath today   Thank you for choosing [facility] at [facility] to provide your oncology and hematology care.  To afford each patient quality time with our provider, please arrive at least 15 minutes before your scheduled appointment time.   If you have a lab appointment with the Cancer Center please come in thru the Main Entrance and check in at the main information desk.  You need to re-schedule your appointment should you arrive 10 or more minutes late.  We strive to give you quality time with our providers, and arriving late affects you and other patients whose appointments are after yours.  Also, if you no show three or more times for appointments you may be dismissed from the clinic at the providers discretion.     Again, thank you for choosing [facility].  Our hope is that these requests will decrease the amount of time that you wait before being seen by our physicians.       _____________________________________________________________  Should you have questions after your visit to  Cancer Center, please contact our office at [phone] between the hours of 8:00 a.m. and 4:30 p.m.  Voicemails left after 4:00 p.m. will not be returned until the following business day.  For prescription refill requests, have your pharmacy contact our office and allow 72 hours.    Due to Covid, you will need to wear a mask upon entering the hospital. If you do not have a mask, a mask will be given to you at the Main Entrance upon arrival. For doctor visits, patients may have 1 support person with them. For treatment visits, patients can not have anyone with them due to social distancing guidelines and our immunocompromised population.     

## 2019-08-08 ENCOUNTER — Inpatient Hospital Stay (HOSPITAL_COMMUNITY): Payer: Medicare Other

## 2019-08-08 ENCOUNTER — Other Ambulatory Visit: Payer: Self-pay

## 2019-08-08 ENCOUNTER — Encounter (HOSPITAL_COMMUNITY): Payer: Self-pay

## 2019-08-08 VITALS — BP 127/39 | HR 69 | Temp 97.6°F | Resp 18

## 2019-08-08 DIAGNOSIS — C50812 Malignant neoplasm of overlapping sites of left female breast: Secondary | ICD-10-CM

## 2019-08-08 DIAGNOSIS — C50912 Malignant neoplasm of unspecified site of left female breast: Secondary | ICD-10-CM

## 2019-08-08 DIAGNOSIS — Z171 Estrogen receptor negative status [ER-]: Secondary | ICD-10-CM

## 2019-08-08 DIAGNOSIS — Z5112 Encounter for antineoplastic immunotherapy: Secondary | ICD-10-CM | POA: Diagnosis not present

## 2019-08-08 MED ORDER — PEGFILGRASTIM-JMDB 6 MG/0.6ML ~~LOC~~ SOSY
6.0000 mg | PREFILLED_SYRINGE | Freq: Once | SUBCUTANEOUS | Status: AC
  Administered 2019-08-08: 11:00:00 6 mg via SUBCUTANEOUS
  Filled 2019-08-08: qty 0.6

## 2019-08-08 NOTE — Progress Notes (Signed)
Patient tolerated injection with no complains voiced.  Site clean and dry with no bruising or swelling noted.  No complaints of pain.  Discharged with vital signs stable and no signs or symptoms of distress noted.

## 2019-08-09 ENCOUNTER — Encounter (HOSPITAL_COMMUNITY): Payer: Self-pay | Admitting: Hematology

## 2019-08-09 NOTE — Assessment & Plan Note (Addendum)
1.  Metastatic HER-2 positive left breast cancer to the lungs: -Biopsy of the left breast and left axillary lymph node showed invasive mammary carcinoma, ER/PR negative, Ki-67 50%, HER-2 positive by FISH. - 6 cycles of THP from 06/05/2018-09/18/2018 with maintenance Herceptin and Pertuzumab continued until 12/26/2026 progression. -4 cycles of Kadcyla from 01/26/2019-04/06/2019 with progression. - PET scan on 04/09/2019 showed 6.7 x 5.9 cm left breast mass, left axillary lymph node, left upper lobe lung mass, right lower lobe lung mass, although have increased.  No bone metastasis. - Echocardiogram on 04/24/2019 shows EF 60-65%.  MRI of the brain on 04/24/2019 did not show any evidence of metastatic disease. -4 cycles of trastuzumab Deruxtecan on from 05/01/2019 through 07/16/2019 -She does not report any shortness of breath on exertion.  Lungs are clear to auscultation.  Breast mass has also improved. -We discussed the results of the PET scan dated 07/30/2019 showed improving left breast mass.  Left axillary lymph node metastasis has essentially resolved.  Improved bilateral pulmonary metastasis. -She has gotten very good response.  I have recommended continuation of same dose of drug conjugated antibody. -I plan to see her back in 3 weeks for follow-up.  I plan to repeat 2D echocardiogram prior to next visit.  2.  Left breast pain: -This has resolved completely.  She has hydrocodone 10 mg to be taken as needed.  3.  Diarrhea: -This is chemotherapy-induced.  She will use Imodium as needed.

## 2019-08-10 ENCOUNTER — Encounter (HOSPITAL_COMMUNITY): Payer: Self-pay | Admitting: *Deleted

## 2019-08-20 ENCOUNTER — Encounter (HOSPITAL_COMMUNITY): Payer: Self-pay | Admitting: *Deleted

## 2019-08-20 ENCOUNTER — Ambulatory Visit (HOSPITAL_COMMUNITY): Payer: Medicare Other | Attending: Hematology

## 2019-08-27 ENCOUNTER — Other Ambulatory Visit: Payer: Self-pay

## 2019-08-27 ENCOUNTER — Inpatient Hospital Stay (HOSPITAL_COMMUNITY): Payer: Medicare Other

## 2019-08-27 ENCOUNTER — Inpatient Hospital Stay (HOSPITAL_BASED_OUTPATIENT_CLINIC_OR_DEPARTMENT_OTHER): Payer: Medicare Other | Admitting: Hematology

## 2019-08-27 ENCOUNTER — Inpatient Hospital Stay (HOSPITAL_COMMUNITY): Payer: Medicare Other | Attending: Hematology

## 2019-08-27 ENCOUNTER — Encounter (HOSPITAL_COMMUNITY): Payer: Self-pay | Admitting: Hematology

## 2019-08-27 VITALS — BP 102/50 | HR 71 | Temp 98.0°F | Resp 18 | Wt 149.8 lb

## 2019-08-27 DIAGNOSIS — Z5189 Encounter for other specified aftercare: Secondary | ICD-10-CM | POA: Diagnosis not present

## 2019-08-27 DIAGNOSIS — C50812 Malignant neoplasm of overlapping sites of left female breast: Secondary | ICD-10-CM

## 2019-08-27 DIAGNOSIS — C50912 Malignant neoplasm of unspecified site of left female breast: Secondary | ICD-10-CM

## 2019-08-27 DIAGNOSIS — Z171 Estrogen receptor negative status [ER-]: Secondary | ICD-10-CM

## 2019-08-27 DIAGNOSIS — Z5112 Encounter for antineoplastic immunotherapy: Secondary | ICD-10-CM | POA: Diagnosis not present

## 2019-08-27 LAB — COMPREHENSIVE METABOLIC PANEL
ALT: 15 U/L (ref 0–44)
AST: 19 U/L (ref 15–41)
Albumin: 3.5 g/dL (ref 3.5–5.0)
Alkaline Phosphatase: 95 U/L (ref 38–126)
Anion gap: 10 (ref 5–15)
BUN: 12 mg/dL (ref 8–23)
CO2: 25 mmol/L (ref 22–32)
Calcium: 9 mg/dL (ref 8.9–10.3)
Chloride: 105 mmol/L (ref 98–111)
Creatinine, Ser: 0.57 mg/dL (ref 0.44–1.00)
GFR calc Af Amer: >60 mL/min (ref >60)
GFR calc non Af Amer: >60 mL/min (ref >60)
Glucose, Bld: 87 mg/dL (ref 70–99)
Potassium: 4 mmol/L (ref 3.5–5.1)
Sodium: 140 mmol/L (ref 135–145)
Total Bilirubin: 0.6 mg/dL (ref 0.3–1.2)
Total Protein: 7.5 g/dL (ref 6.5–8.1)

## 2019-08-27 LAB — CBC WITH DIFFERENTIAL/PLATELET
Abs Immature Granulocytes: 0.01 10*3/uL (ref 0.00–0.07)
Basophils Absolute: 0 10*3/uL (ref 0.0–0.1)
Basophils Relative: 1 %
Eosinophils Absolute: 0.1 10*3/uL (ref 0.0–0.5)
Eosinophils Relative: 2 %
HCT: 41.8 % (ref 36.0–46.0)
Hemoglobin: 12.9 g/dL (ref 12.0–15.0)
Immature Granulocytes: 0 %
Lymphocytes Relative: 18 %
Lymphs Abs: 1 10*3/uL (ref 0.7–4.0)
MCH: 29.8 pg (ref 26.0–34.0)
MCHC: 30.9 g/dL (ref 30.0–36.0)
MCV: 96.5 fL (ref 80.0–100.0)
Monocytes Absolute: 0.5 10*3/uL (ref 0.1–1.0)
Monocytes Relative: 9 %
Neutro Abs: 4.1 10*3/uL (ref 1.7–7.7)
Neutrophils Relative %: 70 %
Platelets: 181 10*3/uL (ref 150–400)
RBC: 4.33 MIL/uL (ref 3.87–5.11)
RDW: 15.9 % — ABNORMAL HIGH (ref 11.5–15.5)
WBC: 5.7 10*3/uL (ref 4.0–10.5)
nRBC: 0 % (ref 0.0–0.2)

## 2019-08-27 MED ORDER — DEXTROSE 5 % IV SOLN: Freq: Once | INTRAVENOUS | Status: AC

## 2019-08-27 MED ORDER — SODIUM CHLORIDE 0.9 % IV SOLN
10.0000 mg | Freq: Once | INTRAVENOUS | Status: AC
  Administered 2019-08-27: 10:00:00 10 mg via INTRAVENOUS
  Filled 2019-08-27: qty 10

## 2019-08-27 MED ORDER — HEPARIN SOD (PORK) LOCK FLUSH 100 UNIT/ML IV SOLN
500.0000 [IU] | Freq: Once | INTRAVENOUS | Status: AC | PRN
  Administered 2019-08-27: 12:00:00 500 [IU]

## 2019-08-27 MED ORDER — DIPHENHYDRAMINE HCL 25 MG PO CAPS
50.0000 mg | ORAL_CAPSULE | Freq: Once | ORAL | Status: AC
  Administered 2019-08-27: 10:00:00 50 mg via ORAL
  Filled 2019-08-27: qty 2

## 2019-08-27 MED ORDER — ACETAMINOPHEN 325 MG PO TABS
650.0000 mg | ORAL_TABLET | Freq: Once | ORAL | Status: AC
  Administered 2019-08-27: 10:00:00 650 mg via ORAL
  Filled 2019-08-27: qty 2

## 2019-08-27 MED ORDER — SODIUM CHLORIDE 0.9% FLUSH
10.0000 mL | INTRAVENOUS | Status: DC | PRN
  Administered 2019-08-27: 10 mL

## 2019-08-27 MED ORDER — FAM-TRASTUZUMAB DERUXTECAN-NXKI CHEMO 100 MG IV SOLR
5.8000 mg/kg | Freq: Once | INTRAVENOUS | Status: AC
  Administered 2019-08-27: 11:00:00 400 mg via INTRAVENOUS
  Filled 2019-08-27: qty 20

## 2019-08-27 MED ORDER — PALONOSETRON HCL INJECTION 0.25 MG/5ML
INTRAVENOUS | Status: AC
  Filled 2019-08-27: qty 5

## 2019-08-27 MED ORDER — PALONOSETRON HCL INJECTION 0.25 MG/5ML
0.2500 mg | Freq: Once | INTRAVENOUS | Status: AC
  Administered 2019-08-27: 0.25 mg via INTRAVENOUS
  Filled 2019-08-27: qty 5

## 2019-08-27 NOTE — Progress Notes (Signed)
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      BRIEF ONCOLOGIC HISTORY:  Oncology History  Malignant neoplasm of overlapping sites of left female breast ([place])  05/15/2018 Initial Diagnosis   Malignant neoplasm of overlapping sites of left female breast ([place])   06/05/2018 - 01/17/2019 Chemotherapy   The patient had pegfilgrastim-cbqv (UDENYCA) injection 6 mg, 6 mg, Subcutaneous, Once, 6 of 6 cycles Administration: 6 mg (06/07/2018), 6 mg (06/28/2018), 6 mg (07/19/2018), 6 mg (08/30/2018), 6 mg (09/20/2018), 6 mg (08/10/2018) trastuzumab (HERCEPTIN) 600 mg in sodium chloride 0.9 % 250 mL chemo infusion, 609 mg, Intravenous,  Once, 10 of 13 cycles Administration: 600 mg (06/05/2018), 450 mg (06/26/2018), 450 mg (07/17/2018), 450 mg (08/28/2018), 450 mg (09/18/2018), 450 mg (08/07/2018), 450 mg (10/19/2018), 450 mg (11/09/2018), 450 mg (12/05/2018), 450 mg (12/26/2018) DOCEtaxel (TAXOTERE) 140 mg in sodium chloride 0.9 % 250 mL chemo infusion, 75 mg/m2 = 140 mg, Intravenous,  Once, 6 of 6 cycles Administration: 140 mg (06/05/2018), 140 mg (06/26/2018), 140 mg (07/17/2018), 140 mg (08/28/2018), 140 mg (09/18/2018), 140 mg (08/07/2018) ondansetron (ZOFRAN) 8 mg, dexamethasone (DECADRON) 4 mg in sodium chloride 0.9 % 50 mL IVPB, , Intravenous,  Once, 6 of 6 cycles Administration:  (06/05/2018),  (06/26/2018),  (07/17/2018),  (08/28/2018),  (09/18/2018),  (08/07/2018) pertuzumab (PERJETA) 840 mg in sodium chloride 0.9 % 250 mL chemo infusion, 840 mg, Intravenous, Once, 10 of 13 cycles Administration: 840 mg (06/05/2018), 420 mg (06/26/2018), 420 mg (07/17/2018), 420 mg (08/28/2018), 420 mg (09/18/2018), 420 mg (08/07/2018), 420 mg (10/19/2018), 420 mg  (11/09/2018), 420 mg (12/05/2018), 420 mg (12/26/2018)  for chemotherapy treatment.    05/01/2019 -  Chemotherapy   The patient had palonosetron (ALOXI) injection 0.25 mg, 0.25 mg, Intravenous,  Once, 6 of 10 cycles Administration: 0.25 mg (05/01/2019), 0.25 mg (05/22/2019), 0.25 mg (08/06/2019), 0.25 mg (08/27/2019), 0.25 mg (06/12/2019), 0.25 mg (07/16/2019) pegfilgrastim-jmdb (FULPHILA) injection 6 mg, 6 mg, Subcutaneous,  Once, 6 of 10 cycles Administration: 6 mg (05/03/2019), 6 mg (05/24/2019), 6 mg (06/14/2019), 6 mg (07/18/2019), 6 mg (08/08/2019) fam-trastuzumab deruxtecan-nxki (ENHERTU) 372 mg in dextrose 5 % 100 mL chemo infusion, 5.4 mg/kg = 372 mg, Intravenous,  Once, 6 of 10 cycles Administration: 372 mg (05/01/2019), 372 mg (05/22/2019), 400 mg (08/06/2019), 400 mg (08/27/2019), 400 mg (06/12/2019), 400 mg (07/16/2019)  for chemotherapy treatment.    HER2-positive carcinoma of left breast ([place])  05/30/2018 Initial Diagnosis   HER2-positive carcinoma of left breast ([place])   01/26/2019 - 04/26/2019 Chemotherapy   The patient had ado-trastuzumab emtansine (KADCYLA) 260 mg in sodium chloride 0.9 % 250 mL chemo infusion, 240 mg, Intravenous, Once, 4 of 5 cycles Administration: 260 mg (01/26/2019), 260 mg (02/16/2019), 260 mg (03/09/2019), 260 mg (04/06/2019)  for chemotherapy treatment.    05/01/2019 -  Chemotherapy   The patient had palonosetron (ALOXI) injection 0.25 mg, 0.25 mg, Intravenous,  Once, 6 of 10 cycles Administration: 0.25 mg (05/01/2019), 0.25 mg (05/22/2019), 0.25 mg (08/06/2019), 0.25 mg (08/27/2019), 0.25 mg (06/12/2019), 0.25 mg (07/16/2019) pegfilgrastim-jmdb (FULPHILA) injection 6 mg, 6 mg, Subcutaneous,  Once, 6 of 10 cycles Administration: 6 mg (05/03/2019), 6 mg (05/24/2019), 6 mg (06/14/2019), 6 mg (07/18/2019), 6 mg (08/08/2019) fam-trastuzumab deruxtecan-nxki (ENHERTU) 372 mg  in dextrose 5 % 100 mL chemo infusion, 5.4 mg/kg = 372 mg, Intravenous,  Once, 6 of 10 cycles Administration: 372 mg  (05/01/2019), 372 mg (05/22/2019), 400 mg (08/06/2019), 400 mg (08/27/2019), 400 mg (06/12/2019), 400 mg (07/16/2019)  for chemotherapy treatment.       CANCER STAGING: Cancer Staging No matching staging information was found for the patient.   INTERVAL HISTORY:  [name] [name] 64 y.o. female seen for follow-up of metastatic HER-2 positive left breast cancer and toxicity assessment prior to next therapy.  Denies any symptoms of PND or orthopnea.  Denies any shortness of breath on exertion.  She was able to walk from the parking lot to the treatment area without stopping.  Appetite is 100%.  Energy levels are 75%.  No pains reported.  She has occasional diarrhea.  Denies any tingling or numbness in extremities.  REVIEW OF SYSTEMS:  Review of Systems  Gastrointestinal: Positive for diarrhea.  All other systems reviewed and are negative.    PAST MEDICAL/SURGICAL HISTORY:  Past Medical History:  Diagnosis Date  . Alzheimer's dementia ([place])   . Bradycardia   . Breast cancer ([place])   . Dementia ([place])   . Depression   . Hypokalemia   . Hypotension   . 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 ABDOMINAL HYSTERECTOMY    . BREAST BIOPSY Left   . PORTACATH PLACEMENT Right 05/15/2018   Procedure: INSERTION PORT-A-CATH;  Surgeon: [name] Signs, MD;  Location: [facility];  Service: General;  Laterality: Right;     SOCIAL HISTORY:  Social History   Socioeconomic History  . Marital status: Widowed    Spouse name: Not on file  . Number of children: Not on file  . Years of education: Not on file  . Highest education level: Not on file  Occupational History  . Not on file  Tobacco Use  . Smoking status: Unknown If Ever Smoked  . Smokeless tobacco: Never Used  Substance and Sexual Activity  . Alcohol use: Not Currently  . Drug use: Never  . Sexual activity: Not Currently  Other Topics Concern  . Not on file   Social History Narrative   L[facility] at [facility] in [place] Strain:   . Difficulty of Paying Living Expenses: Not on file  Food Insecurity:   . Worried About Charity fundraiser in the Last Year: Not on file  . Ran Out of Food in the Last Year: Not on file  Transportation Needs:   . Lack of Transportation (Medical): Not on file  . Lack of Transportation (Non-Medical): Not on file  Physical Activity:   . Days of Exercise per Week: Not on file  . Minutes of Exercise per Session: Not on file  Stress:   . Feeling of Stress : Not on file  Social Connections:   . Frequency of Communication with Friends and Family: Not on file  . Frequency of Social Gatherings with Friends and Family: Not on file  . Attends Religious Services: Not on file  . Active Member of Clubs or Organizations: Not on file  . Attends Archivist Meetings: Not on file  . Marital Status: Not on file  Intimate Partner Violence:   . Fear of Current or Ex-Partner: Not on file  . Emotionally Abused: Not on file  . Physically Abused: Not on file  .  Sexually Abused: Not on file    FAMILY HISTORY:  History reviewed. No pertinent family history.  CURRENT MEDICATIONS:  Outpatient Encounter Medications as of 08/27/2019  Medication Sig  . acetaminophen (TYLENOL) 325 MG tablet Take 650 mg by mouth every 6 (six) hours as needed for mild pain.  Marland Kitchen ALPRAZolam (XANAX) 0.5 MG tablet Take 0.5 mg by mouth 3 (three) times daily as needed.   . betamethasone dipropionate (DIPROLENE) 0.05 % cream 2 (two) times daily. Apply to left breast two times a day for dermatitis use on days that resident doesn't go for chemotherapy.  . busPIRone (BUSPAR) 5 MG tablet Take 5 mg by mouth 2 (two) times daily.  . divalproex (DEPAKOTE) 250 MG DR tablet Take 250 mg by mouth at bedtime.   . divalproex (DEPAKOTE) 500 MG DR tablet Take 500 mg by mouth every morning.   . docusate sodium  (COLACE) 100 MG capsule Take 200 mg by mouth daily. May increase to 2 capsules in the am and 2 capsules in the pm. If no BM, call the [facility].  . donepezil (ARICEPT) 10 MG tablet Take 10 mg by mouth at bedtime.   . fam-trastuzumab deruxtecan-nxki 5.4 mg/kg in dextrose 5 % 100 mL Inject 5.4 mg/kg into the vein every 21 ( twenty-one) days.  Marland Kitchen HYDROcodone-acetaminophen (NORCO/VICODIN) 5-325 MG tablet Take 1 tablet by mouth every 6 (six) hours as needed.   . lidocaine-prilocaine (EMLA) cream Apply 1 application topically as needed (apply to port site topically every 21 days).  . memantine (NAMENDA) 10 MG tablet Take 10 mg by mouth 2 (two) times daily.  . mirtazapine (REMERON) 7.5 MG tablet Take 7.5 mg by mouth at bedtime.  . ondansetron (ZOFRAN) 4 MG tablet Take 1 tablet (4 mg total) by mouth every 8 (eight) hours as needed for nausea or vomiting.  Marland Kitchen QUEtiapine (SEROQUEL) 25 MG tablet Take 12.5 mg by mouth 3 (three) times daily.   . sertraline (ZOLOFT) 100 MG tablet Take 100 mg by mouth daily.   Marland Kitchen VITAMIN D, CHOLECALCIFEROL, PO Take 2,000 Int'l Units by mouth daily.  . [DISCONTINUED] prochlorperazine (COMPAZINE) 10 MG tablet Take 1 tablet (10 mg total) by mouth every 6 (six) hours as needed (Nausea or vomiting).   No facility-administered encounter medications on file as of 08/27/2019.    ALLERGIES:  No Known Allergies   PHYSICAL EXAM:  ECOG Performance status: 1  There were no vitals filed for this visit. There were no vitals filed for this visit.  Physical Exam Vitals reviewed.  Constitutional:      Appearance: Normal appearance.  Cardiovascular:     Rate and Rhythm: Normal rate and regular rhythm.     Heart sounds: Normal heart sounds.  Pulmonary:     Effort: Pulmonary effort is normal.     Breath sounds: Normal breath sounds.  Abdominal:     General: There is no distension.     Palpations: Abdomen is soft. There is no mass.  Musculoskeletal:        General: No swelling.   Skin:    General: Skin is warm.  Neurological:     General: No focal deficit present.     Mental Status: She is alert and oriented to person, place, and time.  Psychiatric:        Mood and Affect: Mood normal.        Behavior: Behavior normal.    Left breast erythema has improved.  Mass is stable.  LABORATORY DATA:  I have reviewed the labs as listed.  CBC    Component Value Date/Time   WBC 5.7 08/27/2019 0855   RBC 4.33 08/27/2019 0855   HGB 12.9 08/27/2019 0855   HCT 41.8 08/27/2019 0855   PLT 181 08/27/2019 0855   MCV 96.5 08/27/2019 0855   MCH 29.8 08/27/2019 0855   MCHC 30.9 08/27/2019 0855   RDW 15.9 (H) 08/27/2019 0855   LYMPHSABS 1.0 08/27/2019 0855   MONOABS 0.5 08/27/2019 0855   EOSABS 0.1 08/27/2019 0855   BASOSABS 0.0 08/27/2019 0855   CMP Latest Ref Rng & Units 08/27/2019 08/06/2019 07/16/2019  Glucose 70 - 99 mg/dL 87 84 80  BUN 8 - 23 mg/dL '12 11 14  ' Creatinine 0.44 - 1.00 mg/dL 0.57 0.53 0.58  Sodium 135 - 145 mmol/L 140 142 141  Potassium 3.5 - 5.1 mmol/L 4.0 4.0 4.2  Chloride 98 - 111 mmol/L 105 106 104  CO2 22 - 32 mmol/L '25 27 26  ' Calcium 8.9 - 10.3 mg/dL 9.0 9.0 9.3  Total Protein 6.5 - 8.1 g/dL 7.5 7.0 7.6  Total Bilirubin 0.3 - 1.2 mg/dL 0.6 0.4 0.7  Alkaline Phos 38 - 126 U/L 95 103 96  AST 15 - 41 U/L '19 23 22  ' ALT 0 - 44 U/L '15 16 15       ' DIAGNOSTIC IMAGING:  I have independently reviewed the scans and discussed with the patient.   I have reviewed Venita Lick LPN's note and agree with the documentation.  I personally performed a face-to-face visit, made revisions and my assessment and plan is as follows.    ASSESSMENT & PLAN:   HER2-positive carcinoma of left breast ([place]) 1.  Metastatic HER-2 positive left breast cancer to the lungs: -Biopsy of the left breast and left axillary lymph node showed invasive mammary carcinoma, ER/PR negative, Ki-67 50%, HER-2 positive by FISH. - 6 cycles of THP from 06/05/2018-09/18/2018 with  maintenance Herceptin and Pertuzumab continued until 12/26/2026 progression. -4 cycles of Kadcyla from 01/26/2019-04/06/2019 with progression. - PET scan on 04/09/2019 showed 6.7 x 5.9 cm left breast mass, left axillary lymph node, left upper lobe lung mass, right lower lobe lung mass, although have increased.  No bone metastasis. - Echocardiogram on 04/24/2019 shows EF 60-65%.  MRI of the brain on 04/24/2019 did not show any evidence of metastatic disease. -5 cycles of trastuzumab Deruxtecan from 05/01/2019 through 08/06/2019. -She does not report any shortness of breath on exertion.  Lungs are clear to auscultation.  Breast mass also improved. -PET scan results on 07/30/2019 were independently reviewed by me and discussed with patient showed improving left breast mass, left axillary lymph node metastasis has essentially resolved.  Improved bilateral pulmonary metastasis. -We have communicated the results with the patient's sister. -She is tolerating this treatment very well.  I have reviewed her labs.  She may proceed with next cycle today. -She will come back in 3 weeks for follow-up.  2.  High risk drug monitoring: -Last echocardiogram reviewed by me on 04/24/2019 shows EF 60-65%. -We will plan to repeat another echocardiogram prior to next visit.  3.  Diarrhea: -She has occasional diarrhea which is chemotherapy-induced.  She will use Imodium as needed.      Orders placed this encounter:  Orders Placed This Encounter  Procedures  . CBC with Differential/Platelet  . Comprehensive metabolic panel  . ECHOCARDIOGRAM COMPLETE      Derek Jack, [place] [phone]

## 2019-08-27 NOTE — Patient Instructions (Signed)
[facility] at [facility] Discharge Instructions  Labs drawn from portacath today   Thank you for choosing [facility] at [facility] to provide your oncology and hematology care.  To afford each patient quality time with our provider, please arrive at least 15 minutes before your scheduled appointment time.   If you have a lab appointment with the Cancer Center please come in thru the Main Entrance and check in at the main information desk.  You need to re-schedule your appointment should you arrive 10 or more minutes late.  We strive to give you quality time with our providers, and arriving late affects you and other patients whose appointments are after yours.  Also, if you no show three or more times for appointments you may be dismissed from the clinic at the providers discretion.     Again, thank you for choosing [facility].  Our hope is that these requests will decrease the amount of time that you wait before being seen by our physicians.       _____________________________________________________________  Should you have questions after your visit to  Cancer Center, please contact our office at [phone] between the hours of 8:00 a.m. and 4:30 p.m.  Voicemails left after 4:00 p.m. will not be returned until the following business day.  For prescription refill requests, have your pharmacy contact our office and allow 72 hours.    Due to Covid, you will need to wear a mask upon entering the hospital. If you do not have a mask, a mask will be given to you at the Main Entrance upon arrival. For doctor visits, patients may have 1 support person with them. For treatment visits, patients can not have anyone with them due to social distancing guidelines and our immunocompromised population.     

## 2019-08-27 NOTE — Progress Notes (Signed)
[place] Labs reviewed with and pt seen by Dr. Delton Coombes and pt approved for Enhertu infusion today per MD                                   [name] [name] tolerated Enhertu infusion well without complaints or incident. VSS upon discharge. Pt discharged self ambulatory in satisfactory condition accompanied by her caregiver

## 2019-08-27 NOTE — Patient Instructions (Signed)
[facility] at [facility] Discharge Instructions  You were seen today by Dr. Katragadda. He went over your recent lab results. He will see you back in 3 weeks for labs, treatment and follow up.   Thank you for choosing [facility] at [facility] to provide your oncology and hematology care.  To afford each patient quality time with our provider, please arrive at least 15 minutes before your scheduled appointment time.   If you have a lab appointment with the Cancer Center please come in thru the  Main Entrance and check in at the main information desk  You need to re-schedule your appointment should you arrive 10 or more minutes late.  We strive to give you quality time with our providers, and arriving late affects you and other patients whose appointments are after yours.  Also, if you no show three or more times for appointments you may be dismissed from the clinic at the providers discretion.     Again, thank you for choosing [facility].  Our hope is that these requests will decrease the amount of time that you wait before being seen by our physicians.       _____________________________________________________________  Should you have questions after your visit to  Cancer Center, please contact our office at [phone] between the hours of 8:00 a.m. and 4:30 p.m.  Voicemails left after 4:00 p.m. will not be returned until the following business day.  For prescription refill requests, have your pharmacy contact our office and allow 72 hours.    Cancer Center Support Programs:   > Cancer Support Group  2nd Tuesday of the month 1pm-2pm, Journey Room    

## 2019-08-27 NOTE — Assessment & Plan Note (Addendum)
1.  Metastatic HER-2 positive left breast cancer to the lungs: -Biopsy of the left breast and left axillary lymph node showed invasive mammary carcinoma, ER/PR negative, Ki-67 50%, HER-2 positive by FISH. - 6 cycles of THP from 06/05/2018-09/18/2018 with maintenance Herceptin and Pertuzumab continued until 12/26/2026 progression. -4 cycles of Kadcyla from 01/26/2019-04/06/2019 with progression. - PET scan on 04/09/2019 showed 6.7 x 5.9 cm left breast mass, left axillary lymph node, left upper lobe lung mass, right lower lobe lung mass, although have increased.  No bone metastasis. - Echocardiogram on 04/24/2019 shows EF 60-65%.  MRI of the brain on 04/24/2019 did not show any evidence of metastatic disease. -5 cycles of trastuzumab Deruxtecan from 05/01/2019 through 08/06/2019. -She does not report any shortness of breath on exertion.  Lungs are clear to auscultation.  Breast mass also improved. -PET scan results on 07/30/2019 were independently reviewed by me and discussed with patient showed improving left breast mass, left axillary lymph node metastasis has essentially resolved.  Improved bilateral pulmonary metastasis. -We have communicated the results with the patient's sister. -She is tolerating this treatment very well.  I have reviewed her labs.  She may proceed with next cycle today. -She will come back in 3 weeks for follow-up.  2.  High risk drug monitoring: -Last echocardiogram reviewed by me on 04/24/2019 shows EF 60-65%. -We will plan to repeat another echocardiogram prior to next visit.  3.  Diarrhea: -She has occasional diarrhea which is chemotherapy-induced.  She will use Imodium as needed.

## 2019-08-27 NOTE — Patient Instructions (Signed)
[facility] Discharge Instructions for Patients Receiving Chemotherapy   Beginning January 23rd 2017 lab work for the Cancer Center will be done in the  Main lab at [place] on 1st floor. If you have a lab appointment with the Cancer Center please come in thru the  Main Entrance and check in at the main information desk   Today you received the following chemotherapy agents Enhertu. Follow-up as scheduled. Call clinic for any questions or concerns  To help prevent nausea and vomiting after your treatment, we encourage you to take your nausea medication   If you develop nausea and vomiting, or diarrhea that is not controlled by your medication, call the clinic.  The clinic phone number is [phone]. Office hours are Monday-Friday 8:30am-5:00pm.  BELOW ARE SYMPTOMS THAT SHOULD BE REPORTED IMMEDIATELY:  *FEVER GREATER THAN 101.0 F  *CHILLS WITH OR WITHOUT FEVER  NAUSEA AND VOMITING THAT IS NOT CONTROLLED WITH YOUR NAUSEA MEDICATION  *UNUSUAL SHORTNESS OF BREATH  *UNUSUAL BRUISING OR BLEEDING  TENDERNESS IN MOUTH AND THROAT WITH OR WITHOUT PRESENCE OF ULCERS  *URINARY PROBLEMS  *BOWEL PROBLEMS  UNUSUAL RASH Items with * indicate a potential emergency and should be followed up as soon as possible. If you have an emergency after office hours please contact your primary care physician or go to the nearest emergency department.  Please call the clinic during office hours if you have any questions or concerns.   You may also contact the Patient Navigator at [phone] should you have any questions or need assistance in obtaining follow up care.      Resources For Cancer Patients and their Caregivers ? American Cancer Society: Can assist with transportation, wigs, general needs, runs Look Good Feel Better.        1-888-227-6333 ? Cancer Care: Provides financial assistance, online support groups, medication/co-pay assistance.  1-800-813-HOPE  (4673) ? [facility] Assists [place] cancer patients and their families through emotional , educational and financial support.  336-427-4357 ? [place] DSS Where to apply for food stamps, Medicaid and utility assistance. 336-342-1394 ? [facility]: Transportation to medical appointments. 336-347-2287 ? Social Security Administration: May apply for disability if have a Stage IV cancer. 336-342-7796 1-800-772-1213 ? [place] Aging, Disability and Transit Services: Assists with nutrition, care and transit needs. 336-349-2343         

## 2019-08-28 LAB — CANCER ANTIGEN 15-3: CA 15-3: 25.4 U/mL — ABNORMAL HIGH (ref 0.0–25.0)

## 2019-08-29 ENCOUNTER — Inpatient Hospital Stay (HOSPITAL_COMMUNITY): Payer: Medicare Other

## 2019-08-29 ENCOUNTER — Other Ambulatory Visit: Payer: Self-pay

## 2019-08-29 VITALS — BP 95/59 | HR 70 | Temp 96.9°F | Resp 18

## 2019-08-29 DIAGNOSIS — C50812 Malignant neoplasm of overlapping sites of left female breast: Secondary | ICD-10-CM | POA: Diagnosis not present

## 2019-08-29 DIAGNOSIS — Z5112 Encounter for antineoplastic immunotherapy: Secondary | ICD-10-CM | POA: Diagnosis not present

## 2019-08-29 DIAGNOSIS — Z5189 Encounter for other specified aftercare: Secondary | ICD-10-CM | POA: Diagnosis not present

## 2019-08-29 DIAGNOSIS — C50912 Malignant neoplasm of unspecified site of left female breast: Secondary | ICD-10-CM

## 2019-08-29 MED ORDER — PEGFILGRASTIM-JMDB 6 MG/0.6ML ~~LOC~~ SOSY
6.0000 mg | PREFILLED_SYRINGE | Freq: Once | SUBCUTANEOUS | Status: AC
  Administered 2019-08-29: 6 mg via SUBCUTANEOUS
  Filled 2019-08-29: qty 0.6

## 2019-08-29 NOTE — Patient Instructions (Signed)
[facility] at [facility]  Discharge Instructions:   _______________________________________________________________  Thank you for choosing [facility] at [facility] to provide your oncology and hematology care.  To afford each patient quality time with our providers, please arrive at least 15 minutes before your scheduled appointment.  You need to re-schedule your appointment if you arrive 10 or more minutes late.  We strive to give you quality time with our providers, and arriving late affects you and other patients whose appointments are after yours.  Also, if you no show three or more times for appointments you may be dismissed from the clinic.  Again, thank you for choosing  Cancer Center at [facility]. Our hope is that these requests will allow you access to exceptional care and in a timely manner. _______________________________________________________________  If you have questions after your visit, please contact our office at [phone] between the hours of 8:30 a.m. and 5:00 p.m. Voicemails left after 4:30 p.m. will not be returned until the following business day. _______________________________________________________________  For prescription refill requests, have your pharmacy contact our office. _______________________________________________________________  Recommendations made by the consultant and any test results will be sent to your referring physician. _______________________________________________________________ 

## 2019-08-29 NOTE — Progress Notes (Signed)
[name] [name] presents today for injection per MD orders. Fulphila administered SQ in right Upper Arm. Administration without incident. Patient tolerated well.   No complaints at this time. Discharged from clinic ambulatory.  F/U with [facility] as scheduled.

## 2019-09-02 DIAGNOSIS — Z1152 Encounter for screening for COVID-19: Secondary | ICD-10-CM | POA: Diagnosis not present

## 2019-09-03 DIAGNOSIS — Z23 Encounter for immunization: Secondary | ICD-10-CM | POA: Diagnosis not present

## 2019-09-04 DIAGNOSIS — R6521 Severe sepsis with septic shock: Secondary | ICD-10-CM | POA: Diagnosis not present

## 2019-09-04 DIAGNOSIS — Z5181 Encounter for therapeutic drug level monitoring: Secondary | ICD-10-CM | POA: Diagnosis not present

## 2019-09-04 DIAGNOSIS — M6281 Muscle weakness (generalized): Secondary | ICD-10-CM | POA: Diagnosis not present

## 2019-09-04 DIAGNOSIS — I10 Essential (primary) hypertension: Secondary | ICD-10-CM | POA: Diagnosis not present

## 2019-09-04 DIAGNOSIS — R112 Nausea with vomiting, unspecified: Secondary | ICD-10-CM | POA: Diagnosis not present

## 2019-09-04 DIAGNOSIS — E872 Acidosis: Secondary | ICD-10-CM | POA: Diagnosis present

## 2019-09-04 DIAGNOSIS — Z7189 Other specified counseling: Secondary | ICD-10-CM | POA: Diagnosis not present

## 2019-09-04 DIAGNOSIS — U071 COVID-19: Secondary | ICD-10-CM | POA: Diagnosis not present

## 2019-09-04 DIAGNOSIS — Z1501 Genetic susceptibility to malignant neoplasm of breast: Secondary | ICD-10-CM | POA: Diagnosis not present

## 2019-09-04 DIAGNOSIS — D630 Anemia in neoplastic disease: Secondary | ICD-10-CM | POA: Diagnosis present

## 2019-09-04 DIAGNOSIS — Z5111 Encounter for antineoplastic chemotherapy: Secondary | ICD-10-CM | POA: Diagnosis not present

## 2019-09-04 DIAGNOSIS — Z66 Do not resuscitate: Secondary | ICD-10-CM | POA: Diagnosis present

## 2019-09-04 DIAGNOSIS — Z79899 Other long term (current) drug therapy: Secondary | ICD-10-CM | POA: Diagnosis not present

## 2019-09-04 DIAGNOSIS — I700 Atherosclerosis of aorta: Secondary | ICD-10-CM | POA: Diagnosis not present

## 2019-09-04 DIAGNOSIS — Z23 Encounter for immunization: Secondary | ICD-10-CM | POA: Diagnosis not present

## 2019-09-04 DIAGNOSIS — H1089 Other conjunctivitis: Secondary | ICD-10-CM | POA: Diagnosis not present

## 2019-09-04 DIAGNOSIS — H04123 Dry eye syndrome of bilateral lacrimal glands: Secondary | ICD-10-CM | POA: Diagnosis not present

## 2019-09-04 DIAGNOSIS — F419 Anxiety disorder, unspecified: Secondary | ICD-10-CM | POA: Diagnosis not present

## 2019-09-04 DIAGNOSIS — F0280 Dementia in other diseases classified elsewhere without behavioral disturbance: Secondary | ICD-10-CM | POA: Diagnosis not present

## 2019-09-04 DIAGNOSIS — T451X5A Adverse effect of antineoplastic and immunosuppressive drugs, initial encounter: Secondary | ICD-10-CM | POA: Diagnosis present

## 2019-09-04 DIAGNOSIS — E639 Nutritional deficiency, unspecified: Secondary | ICD-10-CM | POA: Diagnosis not present

## 2019-09-04 DIAGNOSIS — C50919 Malignant neoplasm of unspecified site of unspecified female breast: Secondary | ICD-10-CM | POA: Diagnosis not present

## 2019-09-04 DIAGNOSIS — Z515 Encounter for palliative care: Secondary | ICD-10-CM | POA: Diagnosis not present

## 2019-09-04 DIAGNOSIS — F329 Major depressive disorder, single episode, unspecified: Secondary | ICD-10-CM | POA: Diagnosis present

## 2019-09-04 DIAGNOSIS — K5730 Diverticulosis of large intestine without perforation or abscess without bleeding: Secondary | ICD-10-CM | POA: Diagnosis not present

## 2019-09-04 DIAGNOSIS — R509 Fever, unspecified: Secondary | ICD-10-CM | POA: Diagnosis not present

## 2019-09-04 DIAGNOSIS — C773 Secondary and unspecified malignant neoplasm of axilla and upper limb lymph nodes: Secondary | ICD-10-CM | POA: Diagnosis not present

## 2019-09-04 DIAGNOSIS — F0391 Unspecified dementia with behavioral disturbance: Secondary | ICD-10-CM | POA: Diagnosis not present

## 2019-09-04 DIAGNOSIS — G8929 Other chronic pain: Secondary | ICD-10-CM | POA: Diagnosis not present

## 2019-09-04 DIAGNOSIS — D6481 Anemia due to antineoplastic chemotherapy: Secondary | ICD-10-CM | POA: Diagnosis present

## 2019-09-04 DIAGNOSIS — I351 Nonrheumatic aortic (valve) insufficiency: Secondary | ICD-10-CM | POA: Diagnosis not present

## 2019-09-04 DIAGNOSIS — A0472 Enterocolitis due to Clostridium difficile, not specified as recurrent: Secondary | ICD-10-CM | POA: Diagnosis not present

## 2019-09-04 DIAGNOSIS — R2689 Other abnormalities of gait and mobility: Secondary | ICD-10-CM | POA: Diagnosis not present

## 2019-09-04 DIAGNOSIS — H2513 Age-related nuclear cataract, bilateral: Secondary | ICD-10-CM | POA: Diagnosis not present

## 2019-09-04 DIAGNOSIS — R5383 Other fatigue: Secondary | ICD-10-CM | POA: Diagnosis not present

## 2019-09-04 DIAGNOSIS — A419 Sepsis, unspecified organism: Secondary | ICD-10-CM | POA: Diagnosis not present

## 2019-09-04 DIAGNOSIS — Z20822 Contact with and (suspected) exposure to covid-19: Secondary | ICD-10-CM | POA: Diagnosis present

## 2019-09-04 DIAGNOSIS — F320 Major depressive disorder, single episode, mild: Secondary | ICD-10-CM | POA: Diagnosis not present

## 2019-09-04 DIAGNOSIS — C50912 Malignant neoplasm of unspecified site of left female breast: Secondary | ICD-10-CM | POA: Diagnosis not present

## 2019-09-04 DIAGNOSIS — Z5112 Encounter for antineoplastic immunotherapy: Secondary | ICD-10-CM | POA: Diagnosis not present

## 2019-09-04 DIAGNOSIS — E559 Vitamin D deficiency, unspecified: Secondary | ICD-10-CM | POA: Diagnosis not present

## 2019-09-04 DIAGNOSIS — Z171 Estrogen receptor negative status [ER-]: Secondary | ICD-10-CM | POA: Diagnosis not present

## 2019-09-04 DIAGNOSIS — F062 Psychotic disorder with delusions due to known physiological condition: Secondary | ICD-10-CM | POA: Diagnosis not present

## 2019-09-04 DIAGNOSIS — R000 Tachycardia, unspecified: Secondary | ICD-10-CM | POA: Diagnosis not present

## 2019-09-04 DIAGNOSIS — K5900 Constipation, unspecified: Secondary | ICD-10-CM | POA: Diagnosis not present

## 2019-09-04 DIAGNOSIS — L309 Dermatitis, unspecified: Secondary | ICD-10-CM | POA: Diagnosis not present

## 2019-09-04 DIAGNOSIS — R262 Difficulty in walking, not elsewhere classified: Secondary | ICD-10-CM | POA: Diagnosis not present

## 2019-09-04 DIAGNOSIS — R197 Diarrhea, unspecified: Secondary | ICD-10-CM | POA: Diagnosis not present

## 2019-09-04 DIAGNOSIS — R54 Age-related physical debility: Secondary | ICD-10-CM | POA: Diagnosis not present

## 2019-09-04 DIAGNOSIS — Z741 Need for assistance with personal care: Secondary | ICD-10-CM | POA: Diagnosis not present

## 2019-09-04 DIAGNOSIS — R195 Other fecal abnormalities: Secondary | ICD-10-CM | POA: Diagnosis not present

## 2019-09-04 DIAGNOSIS — N61 Mastitis without abscess: Secondary | ICD-10-CM | POA: Diagnosis not present

## 2019-09-04 DIAGNOSIS — C7800 Secondary malignant neoplasm of unspecified lung: Secondary | ICD-10-CM | POA: Diagnosis not present

## 2019-09-04 DIAGNOSIS — F339 Major depressive disorder, recurrent, unspecified: Secondary | ICD-10-CM | POA: Diagnosis not present

## 2019-09-04 DIAGNOSIS — R410 Disorientation, unspecified: Secondary | ICD-10-CM | POA: Diagnosis not present

## 2019-09-04 DIAGNOSIS — C50812 Malignant neoplasm of overlapping sites of left female breast: Secondary | ICD-10-CM | POA: Diagnosis not present

## 2019-09-04 DIAGNOSIS — Z5189 Encounter for other specified aftercare: Secondary | ICD-10-CM | POA: Diagnosis not present

## 2019-09-04 DIAGNOSIS — C7801 Secondary malignant neoplasm of right lung: Secondary | ICD-10-CM | POA: Diagnosis not present

## 2019-09-04 DIAGNOSIS — F0390 Unspecified dementia without behavioral disturbance: Secondary | ICD-10-CM | POA: Diagnosis not present

## 2019-09-04 DIAGNOSIS — N6320 Unspecified lump in the left breast, unspecified quadrant: Secondary | ICD-10-CM | POA: Diagnosis not present

## 2019-09-04 DIAGNOSIS — Z0189 Encounter for other specified special examinations: Secondary | ICD-10-CM | POA: Diagnosis not present

## 2019-09-04 DIAGNOSIS — R64 Cachexia: Secondary | ICD-10-CM | POA: Diagnosis present

## 2019-09-04 DIAGNOSIS — G319 Degenerative disease of nervous system, unspecified: Secondary | ICD-10-CM | POA: Diagnosis not present

## 2019-09-04 DIAGNOSIS — N63 Unspecified lump in unspecified breast: Secondary | ICD-10-CM | POA: Diagnosis not present

## 2019-09-04 DIAGNOSIS — R6889 Other general symptoms and signs: Secondary | ICD-10-CM | POA: Diagnosis not present

## 2019-09-04 DIAGNOSIS — G309 Alzheimer's disease, unspecified: Secondary | ICD-10-CM | POA: Diagnosis not present

## 2019-09-04 DIAGNOSIS — C7802 Secondary malignant neoplasm of left lung: Secondary | ICD-10-CM | POA: Diagnosis not present

## 2019-09-12 ENCOUNTER — Ambulatory Visit (HOSPITAL_COMMUNITY): Payer: Medicare Other | Attending: Hematology

## 2019-09-17 ENCOUNTER — Ambulatory Visit (HOSPITAL_COMMUNITY): Payer: Medicare Other | Admitting: Hematology

## 2019-09-17 ENCOUNTER — Ambulatory Visit (HOSPITAL_COMMUNITY): Payer: Medicare Other

## 2019-09-17 ENCOUNTER — Other Ambulatory Visit (HOSPITAL_COMMUNITY): Payer: Medicare Other

## 2019-09-17 DIAGNOSIS — G309 Alzheimer's disease, unspecified: Secondary | ICD-10-CM | POA: Diagnosis not present

## 2019-09-17 DIAGNOSIS — F320 Major depressive disorder, single episode, mild: Secondary | ICD-10-CM | POA: Diagnosis not present

## 2019-09-17 DIAGNOSIS — I10 Essential (primary) hypertension: Secondary | ICD-10-CM | POA: Diagnosis not present

## 2019-09-17 DIAGNOSIS — U071 COVID-19: Secondary | ICD-10-CM | POA: Diagnosis not present

## 2019-09-17 DIAGNOSIS — F0280 Dementia in other diseases classified elsewhere without behavioral disturbance: Secondary | ICD-10-CM | POA: Diagnosis not present

## 2019-09-19 ENCOUNTER — Ambulatory Visit (HOSPITAL_COMMUNITY): Payer: Medicare Other

## 2019-09-19 DIAGNOSIS — H04123 Dry eye syndrome of bilateral lacrimal glands: Secondary | ICD-10-CM | POA: Diagnosis not present

## 2019-09-19 DIAGNOSIS — R195 Other fecal abnormalities: Secondary | ICD-10-CM | POA: Diagnosis not present

## 2019-09-19 DIAGNOSIS — U071 COVID-19: Secondary | ICD-10-CM | POA: Diagnosis not present

## 2019-09-25 ENCOUNTER — Inpatient Hospital Stay (HOSPITAL_COMMUNITY): Payer: Medicare Other | Attending: Hematology

## 2019-09-25 ENCOUNTER — Encounter (HOSPITAL_COMMUNITY): Payer: Self-pay | Admitting: Hematology

## 2019-09-25 ENCOUNTER — Inpatient Hospital Stay (HOSPITAL_COMMUNITY): Payer: Medicare Other

## 2019-09-25 ENCOUNTER — Inpatient Hospital Stay (HOSPITAL_BASED_OUTPATIENT_CLINIC_OR_DEPARTMENT_OTHER): Payer: Medicare Other | Admitting: Hematology

## 2019-09-25 ENCOUNTER — Other Ambulatory Visit: Payer: Self-pay

## 2019-09-25 VITALS — BP 104/48 | HR 70 | Temp 98.7°F | Resp 18

## 2019-09-25 DIAGNOSIS — Z5112 Encounter for antineoplastic immunotherapy: Secondary | ICD-10-CM | POA: Diagnosis not present

## 2019-09-25 DIAGNOSIS — C773 Secondary and unspecified malignant neoplasm of axilla and upper limb lymph nodes: Secondary | ICD-10-CM | POA: Insufficient documentation

## 2019-09-25 DIAGNOSIS — C50912 Malignant neoplasm of unspecified site of left female breast: Secondary | ICD-10-CM

## 2019-09-25 DIAGNOSIS — C7802 Secondary malignant neoplasm of left lung: Secondary | ICD-10-CM | POA: Diagnosis not present

## 2019-09-25 DIAGNOSIS — C7801 Secondary malignant neoplasm of right lung: Secondary | ICD-10-CM | POA: Diagnosis not present

## 2019-09-25 DIAGNOSIS — C50812 Malignant neoplasm of overlapping sites of left female breast: Secondary | ICD-10-CM | POA: Diagnosis not present

## 2019-09-25 DIAGNOSIS — Z5189 Encounter for other specified aftercare: Secondary | ICD-10-CM | POA: Insufficient documentation

## 2019-09-25 LAB — COMPREHENSIVE METABOLIC PANEL
ALT: 18 U/L (ref 0–44)
AST: 32 U/L (ref 15–41)
Albumin: 3.3 g/dL — ABNORMAL LOW (ref 3.5–5.0)
Alkaline Phosphatase: 85 U/L (ref 38–126)
Anion gap: 11 (ref 5–15)
BUN: 10 mg/dL (ref 8–23)
CO2: 28 mmol/L (ref 22–32)
Calcium: 8.5 mg/dL — ABNORMAL LOW (ref 8.9–10.3)
Chloride: 104 mmol/L (ref 98–111)
Creatinine, Ser: 0.56 mg/dL (ref 0.44–1.00)
GFR calc Af Amer: >60 mL/min (ref >60)
GFR calc non Af Amer: >60 mL/min (ref >60)
Glucose, Bld: 85 mg/dL (ref 70–99)
Potassium: 3.3 mmol/L — ABNORMAL LOW (ref 3.5–5.1)
Sodium: 143 mmol/L (ref 135–145)
Total Bilirubin: 0.6 mg/dL (ref 0.3–1.2)
Total Protein: 7 g/dL (ref 6.5–8.1)

## 2019-09-25 LAB — CBC WITH DIFFERENTIAL/PLATELET
Abs Immature Granulocytes: 0.01 10*3/uL (ref 0.00–0.07)
Basophils Absolute: 0 10*3/uL (ref 0.0–0.1)
Basophils Relative: 0 %
Eosinophils Absolute: 0 10*3/uL (ref 0.0–0.5)
Eosinophils Relative: 0 %
HCT: 41.2 % (ref 36.0–46.0)
Hemoglobin: 13 g/dL (ref 12.0–15.0)
Immature Granulocytes: 0 %
Lymphocytes Relative: 10 %
Lymphs Abs: 0.7 10*3/uL (ref 0.7–4.0)
MCH: 30.4 pg (ref 26.0–34.0)
MCHC: 31.6 g/dL (ref 30.0–36.0)
MCV: 96.5 fL (ref 80.0–100.0)
Monocytes Absolute: 0.5 10*3/uL (ref 0.1–1.0)
Monocytes Relative: 8 %
Neutro Abs: 5.6 10*3/uL (ref 1.7–7.7)
Neutrophils Relative %: 82 %
Platelets: 124 10*3/uL — ABNORMAL LOW (ref 150–400)
RBC: 4.27 MIL/uL (ref 3.87–5.11)
RDW: 16.5 % — ABNORMAL HIGH (ref 11.5–15.5)
WBC: 6.9 10*3/uL (ref 4.0–10.5)
nRBC: 0 % (ref 0.0–0.2)

## 2019-09-25 MED ORDER — POTASSIUM CHLORIDE CRYS ER 20 MEQ PO TBCR
20.0000 meq | EXTENDED_RELEASE_TABLET | Freq: Once | ORAL | Status: AC
  Administered 2019-09-25: 20 meq via ORAL
  Filled 2019-09-25: qty 1

## 2019-09-25 MED ORDER — DEXTROSE 5 % IV SOLN: Freq: Once | INTRAVENOUS | Status: AC

## 2019-09-25 MED ORDER — HEPARIN SOD (PORK) LOCK FLUSH 100 UNIT/ML IV SOLN
500.0000 [IU] | Freq: Once | INTRAVENOUS | Status: AC | PRN
  Administered 2019-09-25: 500 [IU]

## 2019-09-25 MED ORDER — SODIUM CHLORIDE 0.9% FLUSH
10.0000 mL | INTRAVENOUS | Status: DC | PRN
  Administered 2019-09-25: 09:00:00 10 mL

## 2019-09-25 MED ORDER — PALONOSETRON HCL INJECTION 0.25 MG/5ML
0.2500 mg | Freq: Once | INTRAVENOUS | Status: AC
  Administered 2019-09-25: 10:00:00 0.25 mg via INTRAVENOUS
  Filled 2019-09-25: qty 5

## 2019-09-25 MED ORDER — FAM-TRASTUZUMAB DERUXTECAN-NXKI CHEMO 100 MG IV SOLR
5.4000 mg/kg | Freq: Once | INTRAVENOUS | Status: AC
  Administered 2019-09-25: 11:00:00 372 mg via INTRAVENOUS
  Filled 2019-09-25: qty 18.6

## 2019-09-25 MED ORDER — SODIUM CHLORIDE 0.9 % IV SOLN
10.0000 mg | Freq: Once | INTRAVENOUS | Status: AC
  Administered 2019-09-25: 10 mg via INTRAVENOUS
  Filled 2019-09-25: qty 10

## 2019-09-25 MED ORDER — DIPHENHYDRAMINE HCL 25 MG PO CAPS
50.0000 mg | ORAL_CAPSULE | Freq: Once | ORAL | Status: AC
  Administered 2019-09-25: 10:00:00 50 mg via ORAL
  Filled 2019-09-25: qty 2

## 2019-09-25 MED ORDER — ACETAMINOPHEN 325 MG PO TABS
650.0000 mg | ORAL_TABLET | Freq: Once | ORAL | Status: AC
  Administered 2019-09-25: 10:00:00 650 mg via ORAL
  Filled 2019-09-25: qty 2

## 2019-09-25 NOTE — Progress Notes (Signed)
Patient has been assessed, vital signs and labs have been reviewed by Dr. Delton Coombes. ANC, Creatinine, LFTs, and Platelets are within treatment parameters, potassium is low today , please give 20 mEq po per Dr. Delton Coombes. The patient is good to proceed with treatment at this time.

## 2019-09-25 NOTE — Progress Notes (Signed)
[place], [place] [address]   CLINIC:  Medical Oncology/Hematology  PCP:  Bonnita Nasuti, MD [address] 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      BRIEF ONCOLOGIC HISTORY:  Oncology History  Malignant neoplasm of overlapping sites of left female breast ([place])  05/15/2018 Initial Diagnosis   Malignant neoplasm of overlapping sites of left female breast ([place])   06/05/2018 - 01/17/2019 Chemotherapy   The patient had pegfilgrastim-cbqv (UDENYCA) injection 6 mg, 6 mg, Subcutaneous, Once, 6 of 6 cycles Administration: 6 mg (06/07/2018), 6 mg (06/28/2018), 6 mg (07/19/2018), 6 mg (08/30/2018), 6 mg (09/20/2018), 6 mg (08/10/2018) trastuzumab (HERCEPTIN) 600 mg in sodium chloride 0.9 % 250 mL chemo infusion, 609 mg, Intravenous,  Once, 10 of 13 cycles Administration: 600 mg (06/05/2018), 450 mg (06/26/2018), 450 mg (07/17/2018), 450 mg (08/28/2018), 450 mg (09/18/2018), 450 mg (08/07/2018), 450 mg (10/19/2018), 450 mg (11/09/2018), 450 mg (12/05/2018), 450 mg (12/26/2018) DOCEtaxel (TAXOTERE) 140 mg in sodium chloride 0.9 % 250 mL chemo infusion, 75 mg/m2 = 140 mg, Intravenous,  Once, 6 of 6 cycles Administration: 140 mg (06/05/2018), 140 mg (06/26/2018), 140 mg (07/17/2018), 140 mg (08/28/2018), 140 mg (09/18/2018), 140 mg (08/07/2018) ondansetron (ZOFRAN) 8 mg, dexamethasone (DECADRON) 4 mg in sodium chloride 0.9 % 50 mL IVPB, , Intravenous,  Once, 6 of 6 cycles Administration:  (06/05/2018),  (06/26/2018),  (07/17/2018),  (08/28/2018),  (09/18/2018),  (08/07/2018) pertuzumab (PERJETA) 840 mg in sodium chloride 0.9 % 250 mL chemo infusion, 840 mg, Intravenous, Once, 10 of 13 cycles Administration: 840 mg (06/05/2018), 420 mg (06/26/2018), 420 mg (07/17/2018), 420 mg (08/28/2018), 420 mg (09/18/2018), 420 mg (08/07/2018), 420 mg (10/19/2018), 420 mg  (11/09/2018), 420 mg (12/05/2018), 420 mg (12/26/2018)  for chemotherapy treatment.    05/01/2019 -  Chemotherapy   The patient had palonosetron (ALOXI) injection 0.25 mg, 0.25 mg, Intravenous,  Once, 7 of 10 cycles Administration: 0.25 mg (05/01/2019), 0.25 mg (05/22/2019), 0.25 mg (08/06/2019), 0.25 mg (08/27/2019), 0.25 mg (06/12/2019), 0.25 mg (07/16/2019), 0.25 mg (09/25/2019) pegfilgrastim-jmdb (FULPHILA) injection 6 mg, 6 mg, Subcutaneous,  Once, 7 of 10 cycles Administration: 6 mg (05/03/2019), 6 mg (05/24/2019), 6 mg (06/14/2019), 6 mg (07/18/2019), 6 mg (08/08/2019), 6 mg (08/29/2019) fam-trastuzumab deruxtecan-nxki (ENHERTU) 372 mg in dextrose 5 % 100 mL chemo infusion, 5.4 mg/kg = 372 mg, Intravenous,  Once, 7 of 10 cycles Administration: 372 mg (05/01/2019), 372 mg (05/22/2019), 400 mg (08/06/2019), 400 mg (08/27/2019), 400 mg (06/12/2019), 400 mg (07/16/2019), 372 mg (09/25/2019)  for chemotherapy treatment.    HER2-positive carcinoma of left breast ([place])  05/30/2018 Initial Diagnosis   HER2-positive carcinoma of left breast ([place])   01/26/2019 - 04/26/2019 Chemotherapy   The patient had ado-trastuzumab emtansine (KADCYLA) 260 mg in sodium chloride 0.9 % 250 mL chemo infusion, 240 mg, Intravenous, Once, 4 of 5 cycles Administration: 260 mg (01/26/2019), 260 mg (02/16/2019), 260 mg (03/09/2019), 260 mg (04/06/2019)  for chemotherapy treatment.    05/01/2019 -  Chemotherapy   The patient had palonosetron (ALOXI) injection 0.25 mg, 0.25 mg, Intravenous,  Once, 7 of 10 cycles Administration: 0.25 mg (05/01/2019), 0.25 mg (05/22/2019), 0.25 mg (08/06/2019), 0.25 mg (08/27/2019), 0.25 mg (06/12/2019), 0.25 mg (07/16/2019), 0.25 mg (09/25/2019) pegfilgrastim-jmdb (FULPHILA) injection 6 mg, 6 mg, Subcutaneous,  Once, 7 of 10 cycles Administration: 6 mg (05/03/2019), 6 mg (05/24/2019), 6 mg (  06/14/2019), 6 mg (07/18/2019), 6 mg (08/08/2019), 6 mg (08/29/2019) fam-trastuzumab deruxtecan-nxki (ENHERTU) 372 mg in dextrose 5 % 100 mL  chemo infusion, 5.4 mg/kg = 372 mg, Intravenous,  Once, 7 of 10 cycles Administration: 372 mg (05/01/2019), 372 mg (05/22/2019), 400 mg (08/06/2019), 400 mg (08/27/2019), 400 mg (06/12/2019), 400 mg (07/16/2019), 372 mg (09/25/2019)  for chemotherapy treatment.       CANCER STAGING: Cancer Staging No matching staging information was found for the patient.   INTERVAL HISTORY:  [name] [name] 64 y.o. female seen for follow-up of metastatic HER-2 positive left breast cancer and toxicity assessment prior to next cycle of chemotherapy.  Appetite is 100%.  Energy levels are 75%.  Denies any PND or orthopnea.  Diarrhea is well controlled with Imodium.  No new onset cough reported.  Oxygen saturations 100%.  Respiratory rate is also normal.  REVIEW OF SYSTEMS:  Review of Systems  Gastrointestinal: Positive for diarrhea.  All other systems reviewed and are negative.    PAST MEDICAL/SURGICAL HISTORY:  Past Medical History:  Diagnosis Date  . Alzheimer's dementia ([place])   . Bradycardia   . Breast cancer ([place])   . Dementia ([place])   . Depression   . Hypokalemia   . Hypotension   . 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 ABDOMINAL HYSTERECTOMY    . BREAST BIOPSY Left   . PORTACATH PLACEMENT Right 05/15/2018   Procedure: INSERTION PORT-A-CATH;  Surgeon: Aviva Signs, MD;  Location: [facility];  Service: General;  Laterality: Right;     SOCIAL HISTORY:  Social History   Socioeconomic History  . Marital status: Widowed    Spouse name: Not on file  . Number of children: Not on file  . Years of education: Not on file  . Highest education level: Not on file  Occupational History  . Not on file  Tobacco Use  . Smoking status: Unknown If Ever Smoked  . Smokeless tobacco: Never Used  Substance and Sexual Activity  . Alcohol use: Not Currently  . Drug use: Never  . Sexual activity: Not Currently  Other Topics  Concern  . Not on file  Social History Narrative   Lives at [facility] in [place] Strain:   . Difficulty of Paying Living Expenses: Not on file  Food Insecurity:   . Worried About Charity fundraiser in the Last Year: Not on file  . Ran Out of Food in the Last Year: Not on file  Transportation Needs:   . Lack of Transportation (Medical): Not on file  . Lack of Transportation (Non-Medical): Not on file  Physical Activity:   . Days of Exercise per Week: Not on file  . Minutes of Exercise per Session: Not on file  Stress:   . Feeling of Stress : Not on file  Social Connections:   . Frequency of Communication with Friends and Family: Not on file  . Frequency of Social Gatherings with Friends and Family: Not on file  . Attends Religious Services: Not on file  . Active Member of Clubs or Organizations: Not on file  . Attends Archivist Meetings: Not on file  . Marital Status: Not on file  Intimate Partner Violence:   . Fear of Current or Ex-Partner: Not on file  . Emotionally Abused: Not on file  . Physically Abused: Not on file  .  Sexually Abused: Not on file    FAMILY HISTORY:  History reviewed. No pertinent family history.  CURRENT MEDICATIONS:  Outpatient Encounter Medications as of 09/25/2019  Medication Sig  . acetaminophen (TYLENOL) 325 MG tablet Take 650 mg by mouth every 6 (six) hours as needed for mild pain.  Marland Kitchen ALPRAZolam (XANAX) 0.5 MG tablet Take 0.5 mg by mouth 3 (three) times daily as needed.   . betamethasone dipropionate (DIPROLENE) 0.05 % cream 2 (two) times daily. Apply to left breast two times a day for dermatitis use on days that resident doesn't go for chemotherapy.  . busPIRone (BUSPAR) 5 MG tablet Take 5 mg by mouth 2 (two) times daily.  . divalproex (DEPAKOTE) 250 MG DR tablet Take 250 mg by mouth at bedtime.   . divalproex (DEPAKOTE) 500 MG DR tablet Take 500 mg by mouth every morning.    . docusate sodium (COLACE) 100 MG capsule Take 200 mg by mouth daily. May increase to 2 capsules in the am and 2 capsules in the pm. If no BM, call the [place].  . donepezil (ARICEPT) 10 MG tablet Take 10 mg by mouth at bedtime.   . fam-trastuzumab deruxtecan-nxki 5.4 mg/kg in dextrose 5 % 100 mL Inject 5.4 mg/kg into the vein every 21 ( twenty-one) days.  Marland Kitchen HYDROcodone-acetaminophen (NORCO/VICODIN) 5-325 MG tablet Take 1 tablet by mouth every 6 (six) hours as needed.   . lidocaine-prilocaine (EMLA) cream Apply 1 application topically as needed (apply to port site topically every 21 days).  . memantine (NAMENDA) 10 MG tablet Take 10 mg by mouth 2 (two) times daily.  . mirtazapine (REMERON) 7.5 MG tablet Take 7.5 mg by mouth at bedtime.  . ondansetron (ZOFRAN) 4 MG tablet Take 1 tablet (4 mg total) by mouth every 8 (eight) hours as needed for nausea or vomiting.  Marland Kitchen QUEtiapine (SEROQUEL) 25 MG tablet Take 12.5 mg by mouth 3 (three) times daily.   . sertraline (ZOLOFT) 100 MG tablet Take 100 mg by mouth daily.   Marland Kitchen VITAMIN D, CHOLECALCIFEROL, PO Take 2,000 Int'l Units by mouth daily.  . [DISCONTINUED] prochlorperazine (COMPAZINE) 10 MG tablet Take 1 tablet (10 mg total) by mouth every 6 (six) hours as needed (Nausea or vomiting).   No facility-administered encounter medications on file as of 09/25/2019.    ALLERGIES:  No Known Allergies   PHYSICAL EXAM:  ECOG Performance status: 1  Vitals:   09/25/19 0900  BP: (!) 107/54  Pulse: 73  Resp: 16  Temp: (!) 97.1 F (36.2 C)  SpO2: 100%   Filed Weights   09/25/19 0900  Weight: 140 lb 8 oz (63.7 kg)    Physical Exam Vitals reviewed.  Constitutional:      Appearance: Normal appearance.  Cardiovascular:     Rate and Rhythm: Normal rate and regular rhythm.     Heart sounds: Normal heart sounds.  Pulmonary:     Effort: Pulmonary effort is normal.     Breath sounds: Normal breath sounds.  Abdominal:     General: There is no  distension.     Palpations: Abdomen is soft. There is no mass.  Musculoskeletal:        General: No swelling.  Skin:    General: Skin is warm.  Neurological:     General: No focal deficit present.     Mental Status: She is alert and oriented to person, place, and time.  Psychiatric:        Mood and Affect: Mood  normal.        Behavior: Behavior normal.      LABORATORY DATA:  I have reviewed the labs as listed.  CBC    Component Value Date/Time   WBC 6.9 09/25/2019 0911   RBC 4.27 09/25/2019 0911   HGB 13.0 09/25/2019 0911   HCT 41.2 09/25/2019 0911   PLT 124 (L) 09/25/2019 0911   MCV 96.5 09/25/2019 0911   MCH 30.4 09/25/2019 0911   MCHC 31.6 09/25/2019 0911   RDW 16.5 (H) 09/25/2019 0911   LYMPHSABS 0.7 09/25/2019 0911   MONOABS 0.5 09/25/2019 0911   EOSABS 0.0 09/25/2019 0911   BASOSABS 0.0 09/25/2019 0911   CMP Latest Ref Rng & Units 09/25/2019 08/27/2019 08/06/2019  Glucose 70 - 99 mg/dL 85 87 84  BUN 8 - 23 mg/dL _0 Creatinine 0.44 - 1.00 mg/dL 0.56 0.57 0.53  Sodium 135 - 145 mmol/L 143 140 142  Potassium 3.5 - 5.1 mmol/L 3.3(L) 4.0 4.0  Chloride 98 - 111 mmol/L 104 105 106  CO2 22 - 32 mmol/L _1 Calcium 8.9 - 10.3 mg/dL 8.5(L) 9.0 9.0  Total Protein 6.5 - 8.1 g/dL 7.0 7.5 7.0  Total Bilirubin 0.3 - 1.2 mg/dL 0.6 0.6 0.4  Alkaline Phos 38 - 126 U/L 85 95 103  AST 15 - 41 U/L 32 19 23  ALT 0 - 44 U/L _2 DIAGNOSTIC IMAGING:  I have independently reviewed the scans and discussed with the patient.   I have reviewed Venita Lick LPN's note and agree with the documentation.  I personally performed a face-to-face visit, made revisions and my assessment and plan is as follows.    ASSESSMENT & PLAN:   HER2-positive carcinoma of left breast ([place]) 1.  Metastatic HER-2 positive left breast cancer to the lungs: -6 cycles of THP from 06/05/2018-09/18/2018 with maintenance of Ceftin and Pertuzumab continued until 12/26/2018 with  progression. -4 cycles of Kadcyla from 01/26/2019-04/06/2019 with progression. -6 cycles of trastuzumab deruxtecan 01/26/2019 through 08/27/2019. -We reviewed PET scan results from 07/30/2019 which showed improving left breast mass, left axillary lymph node metastasis which has essentially resolved.  Improved bilateral  lung metastasis. -We have reviewed her labs which are adequate to proceed with her next treatment today. -Her tumor marker reviewed by me on 08/27/2019 improved to 25.4, previously 33.8. -She will be reevaluated in 3 weeks.  2.  High risk drug monitoring: -Last echocardiogram on 04/24/2019 reviewed by me shows EF 60 to 65%. -We have ordered another echocardiogram prior to next visit.  3.  Diarrhea: -She has occasional diarrhea which is treatment induced. -She will use Imodium as needed.      Orders placed this encounter:  No orders of the defined types were placed in this encounter.     Derek Jack, MD [place] [phone]

## 2019-09-25 NOTE — Patient Instructions (Signed)
[facility] Discharge Instructions for Patients Receiving Chemotherapy   Beginning January 23rd 2017 lab work for the Cancer Center will be done in the  Main lab at [place] on 1st floor. If you have a lab appointment with the Cancer Center please come in thru the  Main Entrance and check in at the main information desk   Today you received the following chemotherapy agents Enhertu. Follow-up as scheduled. Call clinic for any questions or concerns  To help prevent nausea and vomiting after your treatment, we encourage you to take your nausea medication   If you develop nausea and vomiting, or diarrhea that is not controlled by your medication, call the clinic.  The clinic phone number is [phone]. Office hours are Monday-Friday 8:30am-5:00pm.  BELOW ARE SYMPTOMS THAT SHOULD BE REPORTED IMMEDIATELY:  *FEVER GREATER THAN 101.0 F  *CHILLS WITH OR WITHOUT FEVER  NAUSEA AND VOMITING THAT IS NOT CONTROLLED WITH YOUR NAUSEA MEDICATION  *UNUSUAL SHORTNESS OF BREATH  *UNUSUAL BRUISING OR BLEEDING  TENDERNESS IN MOUTH AND THROAT WITH OR WITHOUT PRESENCE OF ULCERS  *URINARY PROBLEMS  *BOWEL PROBLEMS  UNUSUAL RASH Items with * indicate a potential emergency and should be followed up as soon as possible. If you have an emergency after office hours please contact your primary care physician or go to the nearest emergency department.  Please call the clinic during office hours if you have any questions or concerns.   You may also contact the Patient Navigator at [phone] should you have any questions or need assistance in obtaining follow up care.      Resources For Cancer Patients and their Caregivers ? American Cancer Society: Can assist with transportation, wigs, general needs, runs Look Good Feel Better.        1-888-227-6333 ? Cancer Care: Provides financial assistance, online support groups, medication/co-pay assistance.  1-800-813-HOPE  (4673) ? [facility] Assists [place] cancer patients and their families through emotional , educational and financial support.  336-427-4357 ? [place] DSS Where to apply for food stamps, Medicaid and utility assistance. 336-342-1394 ? [facility]: Transportation to medical appointments. 336-347-2287 ? Social Security Administration: May apply for disability if have a Stage IV cancer. 336-342-7796 1-800-772-1213 ? [place] Aging, Disability and Transit Services: Assists with nutrition, care and transit needs. 336-349-2343         

## 2019-09-25 NOTE — Progress Notes (Signed)
0955 Labs reviewed with and pt seen by Dr. Delton Coombes and pt approved for Enhertu infusion today with Potassium 20 meq PO ordered x 1 dose today per MD                                                       Sharlyn Bologna tolerated Enhertu infusion well without complaints or incident. VSS upon discharge. Pt discharged self ambulatory in satisfactory condition accompanied by her caregiver

## 2019-09-25 NOTE — Patient Instructions (Addendum)
[place] at [facility] Discharge Instructions  You were seen today by Dr. Delton Coombes. He went over your recent lab results. You need to have an echocardiogram prior to your next visit. He will see you back in 3 weeks for labs and follow up.   Thank you for choosing [place] at [facility] to provide your oncology and hematology care.  To afford each patient quality time with our provider, please arrive at least 15 minutes before your scheduled appointment time.   If you have a lab appointment with the [facility] please come in thru the  Main Entrance and check in at the main information desk  You need to re-schedule your appointment should you arrive 10 or more minutes late.  We strive to give you quality time with our providers, and arriving late affects you and other patients whose appointments are after yours.  Also, if you no show three or more times for appointments you may be dismissed from the clinic at the providers discretion.     Again, thank you for choosing [facility]  Our hope is that these requests will decrease the amount of time that you wait before being seen by our physicians.       _____________________________________________________________  Should you have questions after your visit to [facility], please contact our office at [phone] between the hours of 8:00 a.m. and 4:30 p.m.  Voicemails left after 4:00 p.m. will not be returned until the following business day.  For prescription refill requests, have your pharmacy contact our office and allow 72 hours.    Cancer Center Support Programs:   > Cancer Support Group  2nd Tuesday of the month 1pm-2pm, Journey Room

## 2019-09-25 NOTE — Patient Instructions (Signed)
[facility] at [facility] Discharge Instructions  Labs drawn from portacath today   Thank you for choosing [facility] at [facility] to provide your oncology and hematology care.  To afford each patient quality time with our provider, please arrive at least 15 minutes before your scheduled appointment time.   If you have a lab appointment with the Cancer Center please come in thru the Main Entrance and check in at the main information desk.  You need to re-schedule your appointment should you arrive 10 or more minutes late.  We strive to give you quality time with our providers, and arriving late affects you and other patients whose appointments are after yours.  Also, if you no show three or more times for appointments you may be dismissed from the clinic at the providers discretion.     Again, thank you for choosing [facility].  Our hope is that these requests will decrease the amount of time that you wait before being seen by our physicians.       _____________________________________________________________  Should you have questions after your visit to [facility], please contact our office at [phone] between the hours of 8:00 a.m. and 4:30 p.m.  Voicemails left after 4:00 p.m. will not be returned until the following business day.  For prescription refill requests, have your pharmacy contact our office and allow 72 hours.    Due to Covid, you will need to wear a mask upon entering the hospital. If you do not have a mask, a mask will be given to you at the Main Entrance upon arrival. For doctor visits, patients may have 1 support person with them. For treatment visits, patients can not have anyone with them due to social distancing guidelines and our immunocompromised population.     

## 2019-09-27 ENCOUNTER — Inpatient Hospital Stay (HOSPITAL_COMMUNITY): Payer: Medicare Other

## 2019-09-27 ENCOUNTER — Encounter (HOSPITAL_COMMUNITY): Payer: Self-pay

## 2019-09-27 ENCOUNTER — Other Ambulatory Visit: Payer: Self-pay

## 2019-09-27 VITALS — BP 101/58 | HR 71 | Temp 97.4°F | Resp 18

## 2019-09-27 DIAGNOSIS — C7801 Secondary malignant neoplasm of right lung: Secondary | ICD-10-CM | POA: Diagnosis not present

## 2019-09-27 DIAGNOSIS — C50812 Malignant neoplasm of overlapping sites of left female breast: Secondary | ICD-10-CM | POA: Diagnosis not present

## 2019-09-27 DIAGNOSIS — Z5112 Encounter for antineoplastic immunotherapy: Secondary | ICD-10-CM | POA: Diagnosis not present

## 2019-09-27 DIAGNOSIS — C50912 Malignant neoplasm of unspecified site of left female breast: Secondary | ICD-10-CM

## 2019-09-27 DIAGNOSIS — Z5189 Encounter for other specified aftercare: Secondary | ICD-10-CM | POA: Diagnosis not present

## 2019-09-27 DIAGNOSIS — C7802 Secondary malignant neoplasm of left lung: Secondary | ICD-10-CM | POA: Diagnosis not present

## 2019-09-27 DIAGNOSIS — C773 Secondary and unspecified malignant neoplasm of axilla and upper limb lymph nodes: Secondary | ICD-10-CM | POA: Diagnosis not present

## 2019-09-27 MED ORDER — PEGFILGRASTIM-JMDB 6 MG/0.6ML ~~LOC~~ SOSY
6.0000 mg | PREFILLED_SYRINGE | Freq: Once | SUBCUTANEOUS | Status: AC
  Administered 2019-09-27: 6 mg via SUBCUTANEOUS

## 2019-09-27 MED ORDER — PEGFILGRASTIM-JMDB 6 MG/0.6ML ~~LOC~~ SOSY
PREFILLED_SYRINGE | SUBCUTANEOUS | Status: AC
  Filled 2019-09-27: qty 0.6

## 2019-09-27 NOTE — Progress Notes (Signed)
[name] [name] tolerated Fulphila injection well without complaints or incident. VSS Pt discharged self ambulatory in satisfactory condition accompanied by her caregiver

## 2019-09-27 NOTE — Patient Instructions (Signed)
[facility] at [facility] Discharge Instructions  Received Fulphila injection today. Follow-up as scheduled. Call clinic for any questions or concerns   Thank you for choosing [facility] at [facility] to provide your oncology and hematology care.  To afford each patient quality time with our provider, please arrive at least 15 minutes before your scheduled appointment time.   If you have a lab appointment with the Cancer Center please come in thru the Main Entrance and check in at the main information desk.  You need to re-schedule your appointment should you arrive 10 or more minutes late.  We strive to give you quality time with our providers, and arriving late affects you and other patients whose appointments are after yours.  Also, if you no show three or more times for appointments you may be dismissed from the clinic at the providers discretion.     Again, thank you for choosing [facility].  Our hope is that these requests will decrease the amount of time that you wait before being seen by our physicians.       _____________________________________________________________  Should you have questions after your visit to [facility], please contact our office at [phone] between the hours of 8:00 a.m. and 4:30 p.m.  Voicemails left after 4:00 p.m. will not be returned until the following business day.  For prescription refill requests, have your pharmacy contact our office and allow 72 hours.    Due to Covid, you will need to wear a mask upon entering the hospital. If you do not have a mask, a mask will be given to you at the Main Entrance upon arrival. For doctor visits, patients may have 1 support person with them. For treatment visits, patients can not have anyone with them due to social distancing guidelines and our immunocompromised population.     

## 2019-09-29 ENCOUNTER — Encounter (HOSPITAL_COMMUNITY): Payer: Self-pay | Admitting: Hematology

## 2019-09-29 NOTE — Assessment & Plan Note (Signed)
1.  Metastatic HER-2 positive left breast cancer to the lungs: -6 cycles of THP from 06/05/2018-09/18/2018 with maintenance of Ceftin and Pertuzumab continued until 12/26/2018 with progression. -4 cycles of Kadcyla from 01/26/2019-04/06/2019 with progression. -6 cycles of trastuzumab deruxtecan 01/26/2019 through 08/27/2019. -We reviewed PET scan results from 07/30/2019 which showed improving left breast mass, left axillary lymph node metastasis which has essentially resolved.  Improved bilateral  lung metastasis. -We have reviewed her labs which are adequate to proceed with her next treatment today. -Her tumor marker reviewed by me on 08/27/2019 improved to 25.4, previously 33.8. -She will be reevaluated in 3 weeks.  2.  High risk drug monitoring: -Last echocardiogram on 04/24/2019 reviewed by me shows EF 60 to 65%. -We have ordered another echocardiogram prior to next visit.  3.  Diarrhea: -She has occasional diarrhea which is treatment induced. -She will use Imodium as needed.

## 2019-10-01 DIAGNOSIS — Z23 Encounter for immunization: Secondary | ICD-10-CM | POA: Diagnosis not present

## 2019-10-03 ENCOUNTER — Ambulatory Visit (HOSPITAL_COMMUNITY): Payer: Medicare Other

## 2019-10-05 DIAGNOSIS — F339 Major depressive disorder, recurrent, unspecified: Secondary | ICD-10-CM | POA: Diagnosis not present

## 2019-10-05 DIAGNOSIS — G309 Alzheimer's disease, unspecified: Secondary | ICD-10-CM | POA: Diagnosis not present

## 2019-10-05 DIAGNOSIS — C50812 Malignant neoplasm of overlapping sites of left female breast: Secondary | ICD-10-CM | POA: Diagnosis not present

## 2019-10-05 DIAGNOSIS — F0280 Dementia in other diseases classified elsewhere without behavioral disturbance: Secondary | ICD-10-CM | POA: Diagnosis not present

## 2019-10-06 DIAGNOSIS — F320 Major depressive disorder, single episode, mild: Secondary | ICD-10-CM | POA: Diagnosis not present

## 2019-10-06 DIAGNOSIS — G309 Alzheimer's disease, unspecified: Secondary | ICD-10-CM | POA: Diagnosis not present

## 2019-10-10 ENCOUNTER — Other Ambulatory Visit: Payer: Self-pay

## 2019-10-10 ENCOUNTER — Ambulatory Visit (HOSPITAL_COMMUNITY)
Admission: RE | Admit: 2019-10-10 | Discharge: 2019-10-10 | Disposition: A | Discharge status: Home / Self Care | Payer: Medicare Other | Source: Ambulatory Visit | Attending: Hematology | Admitting: Hematology

## 2019-10-10 DIAGNOSIS — Z0189 Encounter for other specified special examinations: Secondary | ICD-10-CM

## 2019-10-10 DIAGNOSIS — Z79899 Other long term (current) drug therapy: Secondary | ICD-10-CM | POA: Diagnosis not present

## 2019-10-10 DIAGNOSIS — F0280 Dementia in other diseases classified elsewhere without behavioral disturbance: Secondary | ICD-10-CM | POA: Insufficient documentation

## 2019-10-10 DIAGNOSIS — G309 Alzheimer's disease, unspecified: Secondary | ICD-10-CM | POA: Diagnosis not present

## 2019-10-10 DIAGNOSIS — C50912 Malignant neoplasm of unspecified site of left female breast: Secondary | ICD-10-CM

## 2019-10-10 DIAGNOSIS — Z5181 Encounter for therapeutic drug level monitoring: Secondary | ICD-10-CM | POA: Insufficient documentation

## 2019-10-10 DIAGNOSIS — I351 Nonrheumatic aortic (valve) insufficiency: Secondary | ICD-10-CM | POA: Diagnosis not present

## 2019-10-10 NOTE — Progress Notes (Signed)
*  PRELIMINARY RESULTS* Echocardiogram 2D Echocardiogram has been performed.  [name] [name] 10/10/2019, 12:42 PM

## 2019-10-11 DIAGNOSIS — I10 Essential (primary) hypertension: Secondary | ICD-10-CM | POA: Diagnosis not present

## 2019-10-11 DIAGNOSIS — E559 Vitamin D deficiency, unspecified: Secondary | ICD-10-CM | POA: Diagnosis not present

## 2019-10-11 DIAGNOSIS — C7800 Secondary malignant neoplasm of unspecified lung: Secondary | ICD-10-CM | POA: Diagnosis not present

## 2019-10-11 DIAGNOSIS — M6281 Muscle weakness (generalized): Secondary | ICD-10-CM | POA: Diagnosis not present

## 2019-10-16 ENCOUNTER — Inpatient Hospital Stay (HOSPITAL_COMMUNITY): Payer: Medicare Other

## 2019-10-16 ENCOUNTER — Inpatient Hospital Stay (HOSPITAL_BASED_OUTPATIENT_CLINIC_OR_DEPARTMENT_OTHER): Payer: Medicare Other | Admitting: Hematology

## 2019-10-16 ENCOUNTER — Encounter (HOSPITAL_COMMUNITY): Payer: Self-pay | Admitting: Hematology

## 2019-10-16 ENCOUNTER — Other Ambulatory Visit: Payer: Self-pay

## 2019-10-16 VITALS — BP 105/41 | HR 62 | Temp 98.0°F | Resp 18

## 2019-10-16 DIAGNOSIS — C50812 Malignant neoplasm of overlapping sites of left female breast: Secondary | ICD-10-CM | POA: Diagnosis not present

## 2019-10-16 DIAGNOSIS — Z171 Estrogen receptor negative status [ER-]: Secondary | ICD-10-CM

## 2019-10-16 DIAGNOSIS — C7802 Secondary malignant neoplasm of left lung: Secondary | ICD-10-CM | POA: Diagnosis not present

## 2019-10-16 DIAGNOSIS — C773 Secondary and unspecified malignant neoplasm of axilla and upper limb lymph nodes: Secondary | ICD-10-CM | POA: Diagnosis not present

## 2019-10-16 DIAGNOSIS — C7801 Secondary malignant neoplasm of right lung: Secondary | ICD-10-CM | POA: Diagnosis not present

## 2019-10-16 DIAGNOSIS — C50912 Malignant neoplasm of unspecified site of left female breast: Secondary | ICD-10-CM

## 2019-10-16 DIAGNOSIS — Z5189 Encounter for other specified aftercare: Secondary | ICD-10-CM | POA: Diagnosis not present

## 2019-10-16 DIAGNOSIS — Z5112 Encounter for antineoplastic immunotherapy: Secondary | ICD-10-CM | POA: Diagnosis not present

## 2019-10-16 LAB — CBC WITH DIFFERENTIAL/PLATELET
Abs Immature Granulocytes: 0.02 10*3/uL (ref 0.00–0.07)
Basophils Absolute: 0 10*3/uL (ref 0.0–0.1)
Basophils Relative: 1 %
Eosinophils Absolute: 0 10*3/uL (ref 0.0–0.5)
Eosinophils Relative: 1 %
HCT: 37.9 % (ref 36.0–46.0)
Hemoglobin: 11.9 g/dL — ABNORMAL LOW (ref 12.0–15.0)
Immature Granulocytes: 0 %
Lymphocytes Relative: 12 %
Lymphs Abs: 0.8 10*3/uL (ref 0.7–4.0)
MCH: 30.5 pg (ref 26.0–34.0)
MCHC: 31.4 g/dL (ref 30.0–36.0)
MCV: 97.2 fL (ref 80.0–100.0)
Monocytes Absolute: 0.3 10*3/uL (ref 0.1–1.0)
Monocytes Relative: 5 %
Neutro Abs: 5.4 10*3/uL (ref 1.7–7.7)
Neutrophils Relative %: 81 %
Platelets: 135 10*3/uL — ABNORMAL LOW (ref 150–400)
RBC: 3.9 MIL/uL (ref 3.87–5.11)
RDW: 16.7 % — ABNORMAL HIGH (ref 11.5–15.5)
WBC: 6.6 10*3/uL (ref 4.0–10.5)
nRBC: 0 % (ref 0.0–0.2)

## 2019-10-16 LAB — COMPREHENSIVE METABOLIC PANEL
ALT: 13 U/L (ref 0–44)
AST: 29 U/L (ref 15–41)
Albumin: 3 g/dL — ABNORMAL LOW (ref 3.5–5.0)
Alkaline Phosphatase: 96 U/L (ref 38–126)
Anion gap: 9 (ref 5–15)
BUN: 7 mg/dL — ABNORMAL LOW (ref 8–23)
CO2: 30 mmol/L (ref 22–32)
Calcium: 8.6 mg/dL — ABNORMAL LOW (ref 8.9–10.3)
Chloride: 104 mmol/L (ref 98–111)
Creatinine, Ser: 0.57 mg/dL (ref 0.44–1.00)
GFR calc Af Amer: >60 mL/min (ref >60)
GFR calc non Af Amer: >60 mL/min (ref >60)
Glucose, Bld: 79 mg/dL (ref 70–99)
Potassium: 3.5 mmol/L (ref 3.5–5.1)
Sodium: 143 mmol/L (ref 135–145)
Total Bilirubin: 0.4 mg/dL (ref 0.3–1.2)
Total Protein: 6.7 g/dL (ref 6.5–8.1)

## 2019-10-16 MED ORDER — SODIUM CHLORIDE 0.9% FLUSH
10.0000 mL | INTRAVENOUS | Status: DC | PRN
  Administered 2019-10-16: 10:00:00 10 mL

## 2019-10-16 MED ORDER — SODIUM CHLORIDE 0.9 % IV SOLN
10.0000 mg | Freq: Once | INTRAVENOUS | Status: AC
  Administered 2019-10-16: 12:00:00 10 mg via INTRAVENOUS
  Filled 2019-10-16: qty 10

## 2019-10-16 MED ORDER — PALONOSETRON HCL INJECTION 0.25 MG/5ML
0.2500 mg | Freq: Once | INTRAVENOUS | Status: AC
  Administered 2019-10-16: 12:00:00 0.25 mg via INTRAVENOUS
  Filled 2019-10-16: qty 5

## 2019-10-16 MED ORDER — DEXTROSE 5 % IV SOLN: Freq: Once | INTRAVENOUS | Status: AC

## 2019-10-16 MED ORDER — DIPHENHYDRAMINE HCL 25 MG PO CAPS
50.0000 mg | ORAL_CAPSULE | Freq: Once | ORAL | Status: AC
  Administered 2019-10-16: 50 mg via ORAL
  Filled 2019-10-16: qty 2

## 2019-10-16 MED ORDER — HEPARIN SOD (PORK) LOCK FLUSH 100 UNIT/ML IV SOLN
500.0000 [IU] | Freq: Once | INTRAVENOUS | Status: AC | PRN
  Administered 2019-10-16: 13:00:00 500 [IU]

## 2019-10-16 MED ORDER — FAM-TRASTUZUMAB DERUXTECAN-NXKI CHEMO 100 MG IV SOLR
5.4000 mg/kg | Freq: Once | INTRAVENOUS | Status: AC
  Administered 2019-10-16: 13:00:00 372 mg via INTRAVENOUS
  Filled 2019-10-16: qty 18.6

## 2019-10-16 MED ORDER — ACETAMINOPHEN 325 MG PO TABS
650.0000 mg | ORAL_TABLET | Freq: Once | ORAL | Status: AC
  Administered 2019-10-16: 650 mg via ORAL
  Filled 2019-10-16: qty 2

## 2019-10-16 NOTE — Assessment & Plan Note (Addendum)
1.  Metastatic HER-2 positive left breast cancer to the lungs: -7 cycles of trastuzumab deruxtecan from 01/26/2019 through 09/25/2019. -PET scan from 07/30/2019 reviewed by me showed improving left breast mass, left axillary lymph node metastasis which has essentially resolved.  Improved bilateral lung metastasis.  Last tumor marker was 25.4 on 08/27/2019. -I reviewed her labs today.  LFTs were normal.  CBC shows mild thrombocytopenia which is treatment related. -She will proceed with her next cycle today.  She does not have any worsening shortness of breath.  We will reevaluate her in 3 weeks.  2.  High risk drug monitoring: -I have reviewed echocardiogram from 10/10/2019 which showed ejection fraction of 60-65%.  This was compared to prior echo from 04/24/2019 which showed similar EF.  3.  Diarrhea: -He has intermittent diarrhea which is treatment induced. -She will use Imodium as needed.

## 2019-10-16 NOTE — Patient Instructions (Signed)
[facility] Discharge Instructions for Patients Receiving Chemotherapy   Beginning January 23rd 2017 lab work for the Cancer Center will be done in the  Main lab at [facility] on 1st floor. If you have a lab appointment with the Cancer Center please come in thru the  Main Entrance and check in at the main information desk   Today you received the following chemotherapy agents Enhertu. Follow-up as scheduled. Call clinic for any questions or concerns  To help prevent nausea and vomiting after your treatment, we encourage you to take your nausea medication   If you develop nausea and vomiting, or diarrhea that is not controlled by your medication, call the clinic.  The clinic phone number is [phone]. Office hours are Monday-Friday 8:30am-5:00pm.  BELOW ARE SYMPTOMS THAT SHOULD BE REPORTED IMMEDIATELY:  *FEVER GREATER THAN 101.0 F  *CHILLS WITH OR WITHOUT FEVER  NAUSEA AND VOMITING THAT IS NOT CONTROLLED WITH YOUR NAUSEA MEDICATION  *UNUSUAL SHORTNESS OF BREATH  *UNUSUAL BRUISING OR BLEEDING  TENDERNESS IN MOUTH AND THROAT WITH OR WITHOUT PRESENCE OF ULCERS  *URINARY PROBLEMS  *BOWEL PROBLEMS  UNUSUAL RASH Items with * indicate a potential emergency and should be followed up as soon as possible. If you have an emergency after office hours please contact your primary care physician or go to the nearest emergency department.  Please call the clinic during office hours if you have any questions or concerns.   You may also contact the Patient Navigator at [phone] should you have any questions or need assistance in obtaining follow up care.      Resources For Cancer Patients and their Caregivers ? American Cancer Society: Can assist with transportation, wigs, general needs, runs Look Good Feel Better.        1-888-227-6333 ? Cancer Care: Provides financial assistance, online support groups, medication/co-pay assistance.  1-800-813-HOPE  (4673) ? [facility] Assists [place] cancer patients and their families through emotional , educational and financial support.  336-427-4357 ? [place] DSS Where to apply for food stamps, Medicaid and utility assistance. 336-342-1394 ? [facility]: Transportation to medical appointments. 336-347-2287 ? Social Security Administration: May apply for disability if have a Stage IV cancer. 336-342-7796 1-800-772-1213 ? [place] Aging, Disability and Transit Services: Assists with nutrition, care and transit needs. 336-349-2343         

## 2019-10-16 NOTE — Progress Notes (Signed)
1100 Labs reviewed with and pt seen by Dr. Delton Coombes and pt approved for Enhertu infusion today per MD                                   Sharlyn Bologna tolerated Enhertu infusion well without complaints or incident. VSS upon discharge. Pt discharged self ambulatory in satisfactory condition accompanied by her caregiver

## 2019-10-16 NOTE — Progress Notes (Signed)
[place], [place] [address]   CLINIC:  Medical Oncology/Hematology  PCP:  Bonnita Nasuti, MD [address] 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      BRIEF ONCOLOGIC HISTORY:  Oncology History  Malignant neoplasm of overlapping sites of left female breast ([place])  05/15/2018 Initial Diagnosis   Malignant neoplasm of overlapping sites of left female breast ([place])   06/05/2018 - 01/17/2019 Chemotherapy   The patient had pegfilgrastim-cbqv (UDENYCA) injection 6 mg, 6 mg, Subcutaneous, Once, 6 of 6 cycles Administration: 6 mg (06/07/2018), 6 mg (06/28/2018), 6 mg (07/19/2018), 6 mg (08/30/2018), 6 mg (09/20/2018), 6 mg (08/10/2018) trastuzumab (HERCEPTIN) 600 mg in sodium chloride 0.9 % 250 mL chemo infusion, 609 mg, Intravenous,  Once, 10 of 13 cycles Administration: 600 mg (06/05/2018), 450 mg (06/26/2018), 450 mg (07/17/2018), 450 mg (08/28/2018), 450 mg (09/18/2018), 450 mg (08/07/2018), 450 mg (10/19/2018), 450 mg (11/09/2018), 450 mg (12/05/2018), 450 mg (12/26/2018) DOCEtaxel (TAXOTERE) 140 mg in sodium chloride 0.9 % 250 mL chemo infusion, 75 mg/m2 = 140 mg, Intravenous,  Once, 6 of 6 cycles Administration: 140 mg (06/05/2018), 140 mg (06/26/2018), 140 mg (07/17/2018), 140 mg (08/28/2018), 140 mg (09/18/2018), 140 mg (08/07/2018) ondansetron (ZOFRAN) 8 mg, dexamethasone (DECADRON) 4 mg in sodium chloride 0.9 % 50 mL IVPB, , Intravenous,  Once, 6 of 6 cycles Administration:  (06/05/2018),  (06/26/2018),  (07/17/2018),  (08/28/2018),  (09/18/2018),  (08/07/2018) pertuzumab (PERJETA) 840 mg in sodium chloride 0.9 % 250 mL chemo infusion, 840 mg, Intravenous, Once, 10 of 13 cycles Administration: 840 mg (06/05/2018), 420 mg (06/26/2018), 420 mg (07/17/2018), 420 mg (08/28/2018), 420 mg (09/18/2018), 420 mg (08/07/2018), 420 mg (10/19/2018), 420 mg  (11/09/2018), 420 mg (12/05/2018), 420 mg (12/26/2018)  for chemotherapy treatment.    05/01/2019 -  Chemotherapy   The patient had palonosetron (ALOXI) injection 0.25 mg, 0.25 mg, Intravenous,  Once, 8 of 10 cycles Administration: 0.25 mg (05/01/2019), 0.25 mg (05/22/2019), 0.25 mg (08/06/2019), 0.25 mg (08/27/2019), 0.25 mg (06/12/2019), 0.25 mg (07/16/2019), 0.25 mg (09/25/2019), 0.25 mg (10/16/2019) pegfilgrastim-jmdb (FULPHILA) injection 6 mg, 6 mg, Subcutaneous,  Once, 8 of 10 cycles Administration: 6 mg (05/03/2019), 6 mg (05/24/2019), 6 mg (06/14/2019), 6 mg (07/18/2019), 6 mg (08/08/2019), 6 mg (08/29/2019), 6 mg (09/27/2019) fam-trastuzumab deruxtecan-nxki (ENHERTU) 372 mg in dextrose 5 % 100 mL chemo infusion, 5.4 mg/kg = 372 mg, Intravenous,  Once, 8 of 10 cycles Administration: 372 mg (05/01/2019), 372 mg (05/22/2019), 400 mg (08/06/2019), 400 mg (08/27/2019), 400 mg (06/12/2019), 400 mg (07/16/2019), 372 mg (09/25/2019), 372 mg (10/16/2019)  for chemotherapy treatment.    HER2-positive carcinoma of left breast ([facility])  05/30/2018 Initial Diagnosis   HER2-positive carcinoma of left breast ([place])   01/26/2019 - 04/26/2019 Chemotherapy   The patient had ado-trastuzumab emtansine (KADCYLA) 260 mg in sodium chloride 0.9 % 250 mL chemo infusion, 240 mg, Intravenous, Once, 4 of 5 cycles Administration: 260 mg (01/26/2019), 260 mg (02/16/2019), 260 mg (03/09/2019), 260 mg (04/06/2019)  for chemotherapy treatment.    05/01/2019 -  Chemotherapy   The patient had palonosetron (ALOXI) injection 0.25 mg, 0.25 mg, Intravenous,  Once, 8 of 10 cycles Administration: 0.25 mg (05/01/2019), 0.25 mg (05/22/2019), 0.25 mg (08/06/2019), 0.25 mg (08/27/2019), 0.25 mg (06/12/2019), 0.25 mg (07/16/2019), 0.25 mg (09/25/2019), 0.25 mg (10/16/2019) pegfilgrastim-jmdb (FULPHILA) injection 6 mg, 6 mg, Subcutaneous,  Once, 8  of 10 cycles Administration: 6 mg (05/03/2019), 6 mg (05/24/2019), 6 mg (06/14/2019), 6 mg (07/18/2019), 6 mg (08/08/2019), 6 mg  (08/29/2019), 6 mg (09/27/2019) fam-trastuzumab deruxtecan-nxki (ENHERTU) 372 mg in dextrose 5 % 100 mL chemo infusion, 5.4 mg/kg = 372 mg, Intravenous,  Once, 8 of 10 cycles Administration: 372 mg (05/01/2019), 372 mg (05/22/2019), 400 mg (08/06/2019), 400 mg (08/27/2019), 400 mg (06/12/2019), 400 mg (07/16/2019), 372 mg (09/25/2019), 372 mg (10/16/2019)  for chemotherapy treatment.       CANCER STAGING: Cancer Staging No matching staging information was found for the patient.   INTERVAL HISTORY:  Ms. Hegner 64 y.o. female seen for follow-up of metastatic HER-2 positive left breast cancer and toxicity assessment prior to next cycle of chemotherapy.  Reports that diarrhea is well controlled with Imodium as needed.  Reports appetite of 100% and energy levels of 50%.  Denies any cough or shortness of breath on exertion.  Denies any dyspnea while resting.  No skin rashes reported.  No fevers or chills reported.  REVIEW OF SYSTEMS:  Review of Systems  Gastrointestinal: Positive for diarrhea.  All other systems reviewed and are negative.    PAST MEDICAL/SURGICAL HISTORY:  Past Medical History:  Diagnosis Date  . Alzheimer's dementia ([place])   . Bradycardia   . Breast cancer ([place])   . Dementia ([place])   . Depression   . Hypokalemia   . Hypotension   . 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 ABDOMINAL HYSTERECTOMY    . BREAST BIOPSY Left   . PORTACATH PLACEMENT Right 05/15/2018   Procedure: INSERTION PORT-A-CATH;  Surgeon: Aviva Signs, MD;  Location: [facility];  Service: General;  Laterality: Right;     SOCIAL HISTORY:  Social History   Socioeconomic History  . Marital status: Widowed    Spouse name: Not on file  . Number of children: Not on file  . Years of education: Not on file  . Highest education level: Not on file  Occupational History  . Not on file  Tobacco Use  . Smoking status: Unknown If  Ever Smoked  . Smokeless tobacco: Never Used  Substance and Sexual Activity  . Alcohol use: Not Currently  . Drug use: Never  . Sexual activity: Not Currently  Other Topics Concern  . Not on file  Social History Narrative   L[facility] at [facility] in [place] Strain:   . Difficulty of Paying Living Expenses: Not on file  Food Insecurity:   . Worried About Charity fundraiser in the Last Year: Not on file  . Ran Out of Food in the Last Year: Not on file  Transportation Needs:   . Lack of Transportation (Medical): Not on file  . Lack of Transportation (Non-Medical): Not on file  Physical Activity:   . Days of Exercise per Week: Not on file  . Minutes of Exercise per Session: Not on file  Stress:   . Feeling of Stress : Not on file  Social Connections:   . Frequency of Communication with Friends and Family: Not on file  . Frequency of Social Gatherings with Friends and Family: Not on file  . Attends Religious Services: Not on file  . Active Member of Clubs or Organizations: Not on file  . Attends Archivist Meetings: Not on file  . Marital Status: Not on file  Intimate  Partner Violence:   . Fear of Current or Ex-Partner: Not on file  . Emotionally Abused: Not on file  . Physically Abused: Not on file  . Sexually Abused: Not on file    FAMILY HISTORY:  History reviewed. No pertinent family history.  CURRENT MEDICATIONS:  Outpatient Encounter Medications as of 10/16/2019  Medication Sig  . ALPRAZolam (XANAX) 0.5 MG tablet Take 0.5 mg by mouth 3 (three) times daily as needed.   . busPIRone (BUSPAR) 5 MG tablet Take 5 mg by mouth 2 (two) times daily.  . divalproex (DEPAKOTE) 250 MG DR tablet Take 250 mg by mouth at bedtime.   . divalproex (DEPAKOTE) 500 MG DR tablet Take 500 mg by mouth every morning.   . docusate sodium (COLACE) 100 MG capsule Take 200 mg by mouth daily. May increase to 2 capsules in the am and  2 capsules in the pm. If no BM, call the [facility].  . donepezil (ARICEPT) 10 MG tablet Take 10 mg by mouth at bedtime.   . fam-trastuzumab deruxtecan-nxki 5.4 mg/kg in dextrose 5 % 100 mL Inject 5.4 mg/kg into the vein every 21 ( twenty-one) days.  . memantine (NAMENDA) 10 MG tablet Take 10 mg by mouth 2 (two) times daily.  . mirtazapine (REMERON) 7.5 MG tablet Take 7.5 mg by mouth at bedtime.  Marland Kitchen QUEtiapine (SEROQUEL) 25 MG tablet Take 12.5 mg by mouth 3 (three) times daily.   . sertraline (ZOLOFT) 100 MG tablet Take 100 mg by mouth daily.   Marland Kitchen VITAMIN D, CHOLECALCIFEROL, PO Take 2,000 Int'l Units by mouth daily.  Marland Kitchen acetaminophen (TYLENOL) 325 MG tablet Take 650 mg by mouth every 6 (six) hours as needed for mild pain.  Marland Kitchen betamethasone dipropionate (DIPROLENE) 0.05 % cream 2 (two) times daily. Apply to left breast two times a day for dermatitis use on days that resident doesn't go for chemotherapy.  Marland Kitchen HYDROcodone-acetaminophen (NORCO/VICODIN) 5-325 MG tablet Take 1 tablet by mouth every 6 (six) hours as needed.   . lidocaine-prilocaine (EMLA) cream Apply 1 application topically as needed (apply to port site topically every 21 days).  . ondansetron (ZOFRAN) 4 MG tablet Take 1 tablet (4 mg total) by mouth every 8 (eight) hours as needed for nausea or vomiting. (Patient not taking: Reported on 10/16/2019)  . [DISCONTINUED] prochlorperazine (COMPAZINE) 10 MG tablet Take 1 tablet (10 mg total) by mouth every 6 (six) hours as needed (Nausea or vomiting).   No facility-administered encounter medications on file as of 10/16/2019.    ALLERGIES:  No Known Allergies   PHYSICAL EXAM:  ECOG Performance status: 1  Vitals:   10/16/19 0943  BP: (!) 107/40  Pulse: 63  Resp: 18  Temp: 97.9 F (36.6 C)  SpO2: 100%   Filed Weights   10/16/19 0943  Weight: 148 lb (67.1 kg)    Physical Exam Vitals reviewed.  Constitutional:      Appearance: Normal appearance.  Cardiovascular:     Rate and  Rhythm: Normal rate and regular rhythm.     Heart sounds: Normal heart sounds.  Pulmonary:     Effort: Pulmonary effort is normal.     Breath sounds: Normal breath sounds.  Abdominal:     General: There is no distension.     Palpations: Abdomen is soft. There is no mass.  Musculoskeletal:        General: No swelling.  Skin:    General: Skin is warm.  Neurological:     General: No  focal deficit present.     Mental Status: She is alert and oriented to person, place, and time.  Psychiatric:        Mood and Affect: Mood normal.        Behavior: Behavior normal.      LABORATORY DATA:  I have reviewed the labs as listed.  CBC    Component Value Date/Time   WBC 6.6 10/16/2019 0940   RBC 3.90 10/16/2019 0940   HGB 11.9 (L) 10/16/2019 0940   HCT 37.9 10/16/2019 0940   PLT 135 (L) 10/16/2019 0940   MCV 97.2 10/16/2019 0940   MCH 30.5 10/16/2019 0940   MCHC 31.4 10/16/2019 0940   RDW 16.7 (H) 10/16/2019 0940   LYMPHSABS 0.8 10/16/2019 0940   MONOABS 0.3 10/16/2019 0940   EOSABS 0.0 10/16/2019 0940   BASOSABS 0.0 10/16/2019 0940   CMP Latest Ref Rng & Units 10/16/2019 09/25/2019 08/27/2019  Glucose 70 - 99 mg/dL 79 85 87  BUN 8 - 23 mg/dL 7(L) 10 12  Creatinine 0.44 - 1.00 mg/dL 0.57 0.56 0.57  Sodium 135 - 145 mmol/L 143 143 140  Potassium 3.5 - 5.1 mmol/L 3.5 3.3(L) 4.0  Chloride 98 - 111 mmol/L 104 104 105  CO2 22 - 32 mmol/L '30 28 25  ' Calcium 8.9 - 10.3 mg/dL 8.6(L) 8.5(L) 9.0  Total Protein 6.5 - 8.1 g/dL 6.7 7.0 7.5  Total Bilirubin 0.3 - 1.2 mg/dL 0.4 0.6 0.6  Alkaline Phos 38 - 126 U/L 96 85 95  AST 15 - 41 U/L 29 32 19  ALT 0 - 44 U/L '13 18 15       ' DIAGNOSTIC IMAGING:  I have independently reviewed scans and discussed with the patient.   I have reviewed Venita Lick LPN's note and agree with the documentation.  I personally performed a face-to-face visit, made revisions and my assessment and plan is as follows.    ASSESSMENT & PLAN:   HER2-positive  carcinoma of left breast ([place]) 1.  Metastatic HER-2 positive left breast cancer to the lungs: -7 cycles of trastuzumab deruxtecan from 01/26/2019 through 09/25/2019. -PET scan from 07/30/2019 reviewed by me showed improving left breast mass, left axillary lymph node metastasis which has essentially resolved.  Improved bilateral lung metastasis.  Last tumor marker was 25.4 on 08/27/2019. -I reviewed her labs today.  LFTs were normal.  CBC shows mild thrombocytopenia which is treatment related. -She will proceed with her next cycle today.  She does not have any worsening shortness of breath.  We will reevaluate her in 3 weeks.  2.  High risk drug monitoring: -I have reviewed echocardiogram from 10/10/2019 which showed ejection fraction of 60-65%.  This was compared to prior echo from 04/24/2019 which showed similar EF.  3.  Diarrhea: -He has intermittent diarrhea which is treatment induced. -She will use Imodium as needed.      Orders placed this encounter:  No orders of the defined types were placed in this encounter.     Derek Jack, MD [place] [phone]

## 2019-10-16 NOTE — Progress Notes (Signed)
Patient has been assessed, vital signs and labs have been reviewed by Dr. Katragadda. ANC, Creatinine, LFTs, and Platelets are within treatment parameters per Dr. Katragadda. The patient is good to proceed with treatment at this time.  

## 2019-10-16 NOTE — Patient Instructions (Addendum)
[facility] at [facility] Discharge Instructions  You were seen today by Dr. Katragadda. He went over your recent lab results. He will see you back in 3 weeks for labs and follow up.   Thank you for choosing [facility] at [facility] to provide your oncology and hematology care.  To afford each patient quality time with our provider, please arrive at least 15 minutes before your scheduled appointment time.   If you have a lab appointment with the Cancer Center please come in thru the  Main Entrance and check in at the main information desk  You need to re-schedule your appointment should you arrive 10 or more minutes late.  We strive to give you quality time with our providers, and arriving late affects you and other patients whose appointments are after yours.  Also, if you no show three or more times for appointments you may be dismissed from the clinic at the providers discretion.     Again, thank you for choosing [facility].  Our hope is that these requests will decrease the amount of time that you wait before being seen by our physicians.       _____________________________________________________________  Should you have questions after your visit to [facility], please contact our office at [phone] between the hours of 8:00 a.m. and 4:30 p.m.  Voicemails left after 4:00 p.m. will not be returned until the following business day.  For prescription refill requests, have your pharmacy contact our office and allow 72 hours.    Cancer Center Support Programs:   > Cancer Support Group  2nd Tuesday of the month 1pm-2pm, Journey Room    

## 2019-10-17 ENCOUNTER — Encounter (HOSPITAL_COMMUNITY): Payer: Self-pay

## 2019-10-17 ENCOUNTER — Inpatient Hospital Stay (HOSPITAL_COMMUNITY): Payer: Medicare Other

## 2019-10-17 VITALS — BP 102/48 | HR 70 | Temp 97.3°F | Resp 16

## 2019-10-17 DIAGNOSIS — Z5189 Encounter for other specified aftercare: Secondary | ICD-10-CM | POA: Diagnosis not present

## 2019-10-17 DIAGNOSIS — C7801 Secondary malignant neoplasm of right lung: Secondary | ICD-10-CM | POA: Diagnosis not present

## 2019-10-17 DIAGNOSIS — C773 Secondary and unspecified malignant neoplasm of axilla and upper limb lymph nodes: Secondary | ICD-10-CM | POA: Diagnosis not present

## 2019-10-17 DIAGNOSIS — Z5112 Encounter for antineoplastic immunotherapy: Secondary | ICD-10-CM | POA: Diagnosis not present

## 2019-10-17 DIAGNOSIS — C50912 Malignant neoplasm of unspecified site of left female breast: Secondary | ICD-10-CM

## 2019-10-17 DIAGNOSIS — Z171 Estrogen receptor negative status [ER-]: Secondary | ICD-10-CM

## 2019-10-17 DIAGNOSIS — C7802 Secondary malignant neoplasm of left lung: Secondary | ICD-10-CM | POA: Diagnosis not present

## 2019-10-17 DIAGNOSIS — C50812 Malignant neoplasm of overlapping sites of left female breast: Secondary | ICD-10-CM | POA: Diagnosis not present

## 2019-10-17 MED ORDER — PEGFILGRASTIM-JMDB 6 MG/0.6ML ~~LOC~~ SOSY
6.0000 mg | PREFILLED_SYRINGE | Freq: Once | SUBCUTANEOUS | Status: AC
  Administered 2019-10-17: 6 mg via SUBCUTANEOUS

## 2019-10-18 ENCOUNTER — Ambulatory Visit (HOSPITAL_COMMUNITY): Payer: Medicare Other

## 2019-10-26 ENCOUNTER — Other Ambulatory Visit (HOSPITAL_COMMUNITY): Payer: Self-pay | Admitting: Nurse Practitioner

## 2019-10-27 DIAGNOSIS — I10 Essential (primary) hypertension: Secondary | ICD-10-CM | POA: Diagnosis not present

## 2019-10-27 DIAGNOSIS — R410 Disorientation, unspecified: Secondary | ICD-10-CM | POA: Diagnosis not present

## 2019-10-27 DIAGNOSIS — M6281 Muscle weakness (generalized): Secondary | ICD-10-CM | POA: Diagnosis not present

## 2019-10-27 DIAGNOSIS — E559 Vitamin D deficiency, unspecified: Secondary | ICD-10-CM | POA: Diagnosis not present

## 2019-11-02 DIAGNOSIS — F0280 Dementia in other diseases classified elsewhere without behavioral disturbance: Secondary | ICD-10-CM | POA: Diagnosis not present

## 2019-11-02 DIAGNOSIS — C50812 Malignant neoplasm of overlapping sites of left female breast: Secondary | ICD-10-CM | POA: Diagnosis not present

## 2019-11-02 DIAGNOSIS — G309 Alzheimer's disease, unspecified: Secondary | ICD-10-CM | POA: Diagnosis not present

## 2019-11-02 DIAGNOSIS — F320 Major depressive disorder, single episode, mild: Secondary | ICD-10-CM | POA: Diagnosis not present

## 2019-11-06 ENCOUNTER — Inpatient Hospital Stay (HOSPITAL_COMMUNITY): Payer: Medicare Other

## 2019-11-06 ENCOUNTER — Encounter (HOSPITAL_COMMUNITY): Payer: Self-pay | Admitting: Hematology

## 2019-11-06 ENCOUNTER — Other Ambulatory Visit: Payer: Self-pay

## 2019-11-06 ENCOUNTER — Inpatient Hospital Stay (HOSPITAL_COMMUNITY): Payer: Medicare Other | Attending: Hematology | Admitting: Hematology

## 2019-11-06 VITALS — BP 110/42 | HR 76 | Temp 96.9°F | Resp 18 | Wt 138.6 lb

## 2019-11-06 DIAGNOSIS — C50812 Malignant neoplasm of overlapping sites of left female breast: Secondary | ICD-10-CM | POA: Diagnosis not present

## 2019-11-06 DIAGNOSIS — R197 Diarrhea, unspecified: Secondary | ICD-10-CM | POA: Diagnosis not present

## 2019-11-06 DIAGNOSIS — C7802 Secondary malignant neoplasm of left lung: Secondary | ICD-10-CM | POA: Diagnosis not present

## 2019-11-06 DIAGNOSIS — C7801 Secondary malignant neoplasm of right lung: Secondary | ICD-10-CM | POA: Diagnosis not present

## 2019-11-06 DIAGNOSIS — Z79899 Other long term (current) drug therapy: Secondary | ICD-10-CM | POA: Diagnosis not present

## 2019-11-06 DIAGNOSIS — E860 Dehydration: Secondary | ICD-10-CM | POA: Insufficient documentation

## 2019-11-06 DIAGNOSIS — C50912 Malignant neoplasm of unspecified site of left female breast: Secondary | ICD-10-CM

## 2019-11-06 DIAGNOSIS — R5383 Other fatigue: Secondary | ICD-10-CM | POA: Diagnosis not present

## 2019-11-06 DIAGNOSIS — H2513 Age-related nuclear cataract, bilateral: Secondary | ICD-10-CM | POA: Diagnosis not present

## 2019-11-06 DIAGNOSIS — H04123 Dry eye syndrome of bilateral lacrimal glands: Secondary | ICD-10-CM | POA: Diagnosis not present

## 2019-11-06 LAB — COMPREHENSIVE METABOLIC PANEL
ALT: 26 U/L (ref 0–44)
AST: 56 U/L — ABNORMAL HIGH (ref 15–41)
Albumin: 2.9 g/dL — ABNORMAL LOW (ref 3.5–5.0)
Alkaline Phosphatase: 110 U/L (ref 38–126)
Anion gap: 9 (ref 5–15)
BUN: 6 mg/dL — ABNORMAL LOW (ref 8–23)
CO2: 28 mmol/L (ref 22–32)
Calcium: 8.8 mg/dL — ABNORMAL LOW (ref 8.9–10.3)
Chloride: 104 mmol/L (ref 98–111)
Creatinine, Ser: 0.43 mg/dL — ABNORMAL LOW (ref 0.44–1.00)
GFR calc Af Amer: >60 mL/min (ref >60)
GFR calc non Af Amer: >60 mL/min (ref >60)
Glucose, Bld: 80 mg/dL (ref 70–99)
Potassium: 3.3 mmol/L — ABNORMAL LOW (ref 3.5–5.1)
Sodium: 141 mmol/L (ref 135–145)
Total Bilirubin: 0.5 mg/dL (ref 0.3–1.2)
Total Protein: 6.4 g/dL — ABNORMAL LOW (ref 6.5–8.1)

## 2019-11-06 LAB — CBC WITH DIFFERENTIAL/PLATELET
Abs Immature Granulocytes: 0.04 10*3/uL (ref 0.00–0.07)
Basophils Absolute: 0 10*3/uL (ref 0.0–0.1)
Basophils Relative: 0 %
Eosinophils Absolute: 0 10*3/uL (ref 0.0–0.5)
Eosinophils Relative: 0 %
HCT: 36.7 % (ref 36.0–46.0)
Hemoglobin: 11.5 g/dL — ABNORMAL LOW (ref 12.0–15.0)
Immature Granulocytes: 0 %
Lymphocytes Relative: 10 %
Lymphs Abs: 0.9 10*3/uL (ref 0.7–4.0)
MCH: 30.7 pg (ref 26.0–34.0)
MCHC: 31.3 g/dL (ref 30.0–36.0)
MCV: 98.1 fL (ref 80.0–100.0)
Monocytes Absolute: 0.7 10*3/uL (ref 0.1–1.0)
Monocytes Relative: 7 %
Neutro Abs: 7.5 10*3/uL (ref 1.7–7.7)
Neutrophils Relative %: 83 %
Platelets: 144 10*3/uL — ABNORMAL LOW (ref 150–400)
RBC: 3.74 MIL/uL — ABNORMAL LOW (ref 3.87–5.11)
RDW: 18.4 % — ABNORMAL HIGH (ref 11.5–15.5)
WBC: 9.2 10*3/uL (ref 4.0–10.5)
nRBC: 0 % (ref 0.0–0.2)

## 2019-11-06 MED ORDER — SODIUM CHLORIDE 0.9 % IV SOLN
INTRAVENOUS | Status: DC
  Filled 2019-11-06 (×15): qty 1000

## 2019-11-06 MED ORDER — SODIUM CHLORIDE 0.9% FLUSH
10.0000 mL | Freq: Once | INTRAVENOUS | Status: AC
  Administered 2019-11-06: 10 mL via INTRAVENOUS

## 2019-11-06 MED ORDER — HEPARIN SOD (PORK) LOCK FLUSH 100 UNIT/ML IV SOLN
500.0000 [IU] | Freq: Once | INTRAVENOUS | Status: AC
  Administered 2019-11-06: 500 [IU] via INTRAVENOUS

## 2019-11-06 NOTE — Patient Instructions (Addendum)
[facility] at [facility] Discharge Instructions  You were seen today by Dr. Delton Coombes. He went over your recent lab results. He will schedule you for a repeat PET scan and MRI of your brain. He will hold your treatment today and only give you fluids. He will see you back in 3 weeks for labs, treatment and follow up.   Thank you for choosing [facility] at [facility] to provide your oncology and hematology care.  To afford each patient quality time with our provider, please arrive at least 15 minutes before your scheduled appointment time.   If you have a lab appointment with the [facility] please come in thru the  Main Entrance and check in at the main information desk  You need to re-schedule your appointment should you arrive 10 or more minutes late.  We strive to give you quality time with our providers, and arriving late affects you and other patients whose appointments are after yours.  Also, if you no show three or more times for appointments you may be dismissed from the clinic at the providers discretion.     Again, thank you for choosing [facility].  Our hope is that these requests will decrease the amount of time that you wait before being seen by our physicians.       _____________________________________________________________  Should you have questions after your visit to [facility], please contact our office at [phone] between the hours of 8:00 a.m. and 4:30 p.m.  Voicemails left after 4:00 p.m. will not be returned until the following business day.  For prescription refill requests, have your pharmacy contact our office and allow 72 hours.    Cancer Center Support Programs:   > Cancer Support Group  2nd Tuesday of the month 1pm-2pm, Journey Room

## 2019-11-06 NOTE — Assessment & Plan Note (Addendum)
1.  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 -PET scan on 07/30/2019 showed improving left breast mass, left axillary lymph node metastasis which have essentially resolved.  Improved bilateral lung metastasis.  Tumor marker has normalized on 08/27/2019. -I had a prolonged discussion with the sister on the phone today.  We discussed about her scan results.  She is very concerned that her sister has gotten very weak in the last couple of weeks.  Her mental status also reportedly has changed.  She is more withdrawn as per her sister.  She also has worsening of her dementia.  No focal weakness reported. -I will hold her treatment today.  We will arrange for PET CT scan.  We will also arrange for a brain MRI with and without gadolinium. -We will see her back after scans.  2.  High risk drug monitoring: -Echo on 10/10/2019 shows EF 60-65%.  Prior echo on 04/24/2019 showed similar EF.  3.  Diarrhea: -She reportedly had a lot of diarrhea in the last 2 weeks. -According to sister she is very weak.  Her labs today suggest mild hypokalemia. -We will give her 1 L of fluids with electrolytes today.

## 2019-11-06 NOTE — Progress Notes (Signed)
Patient has been assessed, vital signs and labs have been reviewed by Dr. Delton Coombes. He will HOLD Tx today, please 1 liter fluids with electrolytes over 2 hours.

## 2019-11-06 NOTE — Progress Notes (Signed)
Will hold treatment today per MD. Will give hydration fluids per MD today.   Vitals stable and discharged home from clinic ambulatory with sitter from facility. Follow up as scheduled.

## 2019-11-06 NOTE — Progress Notes (Signed)
[place], [place] [address]   CLINIC:  Medical Oncology/Hematology  PCP:  Bonnita Nasuti, MD [address] 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      BRIEF ONCOLOGIC HISTORY:  Oncology History  Malignant neoplasm of overlapping sites of left female breast ([place])  05/15/2018 Initial Diagnosis   Malignant neoplasm of overlapping sites of left female breast ([place])   06/05/2018 - 01/17/2019 Chemotherapy   The patient had pegfilgrastim-cbqv (UDENYCA) injection 6 mg, 6 mg, Subcutaneous, Once, 6 of 6 cycles Administration: 6 mg (06/07/2018), 6 mg (06/28/2018), 6 mg (07/19/2018), 6 mg (08/30/2018), 6 mg (09/20/2018), 6 mg (08/10/2018) trastuzumab (HERCEPTIN) 600 mg in sodium chloride 0.9 % 250 mL chemo infusion, 609 mg, Intravenous,  Once, 10 of 13 cycles Administration: 600 mg (06/05/2018), 450 mg (06/26/2018), 450 mg (07/17/2018), 450 mg (08/28/2018), 450 mg (09/18/2018), 450 mg (08/07/2018), 450 mg (10/19/2018), 450 mg (11/09/2018), 450 mg (12/05/2018), 450 mg (12/26/2018) DOCEtaxel (TAXOTERE) 140 mg in sodium chloride 0.9 % 250 mL chemo infusion, 75 mg/m2 = 140 mg, Intravenous,  Once, 6 of 6 cycles Administration: 140 mg (06/05/2018), 140 mg (06/26/2018), 140 mg (07/17/2018), 140 mg (08/28/2018), 140 mg (09/18/2018), 140 mg (08/07/2018) ondansetron (ZOFRAN) 8 mg, dexamethasone (DECADRON) 4 mg in sodium chloride 0.9 % 50 mL IVPB, , Intravenous,  Once, 6 of 6 cycles Administration:  (06/05/2018),  (06/26/2018),  (07/17/2018),  (08/28/2018),  (09/18/2018),  (08/07/2018) pertuzumab (PERJETA) 840 mg in sodium chloride 0.9 % 250 mL chemo infusion, 840 mg, Intravenous, Once, 10 of 13 cycles Administration: 840 mg (06/05/2018), 420 mg (06/26/2018), 420 mg (07/17/2018), 420 mg (08/28/2018), 420 mg (09/18/2018), 420 mg (08/07/2018), 420 mg (10/19/2018), 420 mg  (11/09/2018), 420 mg (12/05/2018), 420 mg (12/26/2018)  for chemotherapy treatment.    05/01/2019 -  Chemotherapy   The patient had palonosetron (ALOXI) injection 0.25 mg, 0.25 mg, Intravenous,  Once, 8 of 10 cycles Administration: 0.25 mg (05/01/2019), 0.25 mg (05/22/2019), 0.25 mg (08/06/2019), 0.25 mg (08/27/2019), 0.25 mg (06/12/2019), 0.25 mg (07/16/2019), 0.25 mg (09/25/2019), 0.25 mg (10/16/2019) pegfilgrastim-jmdb (FULPHILA) injection 6 mg, 6 mg, Subcutaneous,  Once, 8 of 10 cycles Administration: 6 mg (05/03/2019), 6 mg (05/24/2019), 6 mg (06/14/2019), 6 mg (07/18/2019), 6 mg (08/08/2019), 6 mg (08/29/2019), 6 mg (09/27/2019), 6 mg (10/17/2019) fam-trastuzumab deruxtecan-nxki (ENHERTU) 372 mg in dextrose 5 % 100 mL chemo infusion, 5.4 mg/kg = 372 mg, Intravenous,  Once, 8 of 10 cycles Administration: 372 mg (05/01/2019), 372 mg (05/22/2019), 400 mg (08/06/2019), 400 mg (08/27/2019), 400 mg (06/12/2019), 400 mg (07/16/2019), 372 mg (09/25/2019), 372 mg (10/16/2019)  for chemotherapy treatment.    HER2-positive carcinoma of left breast ([place])  05/30/2018 Initial Diagnosis   HER2-positive carcinoma of left breast ([place])   01/26/2019 - 04/26/2019 Chemotherapy   The patient had ado-trastuzumab emtansine (KADCYLA) 260 mg in sodium chloride 0.9 % 250 mL chemo infusion, 240 mg, Intravenous, Once, 4 of 5 cycles Administration: 260 mg (01/26/2019), 260 mg (02/16/2019), 260 mg (03/09/2019), 260 mg (04/06/2019)  for chemotherapy treatment.    05/01/2019 -  Chemotherapy   The patient had palonosetron (ALOXI) injection 0.25 mg, 0.25 mg, Intravenous,  Once, 8 of 10 cycles Administration: 0.25 mg (05/01/2019), 0.25 mg (05/22/2019), 0.25 mg (08/06/2019), 0.25 mg (08/27/2019), 0.25 mg (06/12/2019), 0.25 mg (07/16/2019), 0.25 mg (09/25/2019), 0.25 mg (10/16/2019) pegfilgrastim-jmdb (FULPHILA) injection 6 mg, 6 mg, Subcutaneous,  Once, 8 of 10 cycles Administration: 6 mg (05/03/2019), 6 mg (05/24/2019), 6 mg (06/14/2019), 6 mg (07/18/2019), 6 mg  (08/08/2019), 6 mg (08/29/2019), 6 mg (09/27/2019), 6 mg (10/17/2019) fam-trastuzumab deruxtecan-nxki (ENHERTU) 372 mg in dextrose 5 % 100 mL chemo infusion, 5.4 mg/kg = 372 mg, Intravenous,  Once, 8 of 10 cycles Administration: 372 mg (05/01/2019), 372 mg (05/22/2019), 400 mg (08/06/2019), 400 mg (08/27/2019), 400 mg (06/12/2019), 400 mg (07/16/2019), 372 mg (09/25/2019), 372 mg (10/16/2019)  for chemotherapy treatment.       CANCER STAGING: Cancer Staging No matching staging information was found for the patient.   INTERVAL HISTORY:  Ms. Mesenbrink 64 y.o. female seen for follow-up of metastatic HER-2 positive left breast cancer and toxicity assessment prior to next cycle of chemotherapy.  Her daughter Neoma Laming joined [place] on the phone while I was in the room with the patient.  She tells me that her sister has not been doing well in the last 2 weeks.  She reported worsening of dementia.  She also reported no control of her bowels and diarrhea.  She thought that her sister is suffering a lot.  She was also reportedly very weak.  The symptoms are new and developed in the last 2 weeks after her last treatment 3 weeks ago.  The patient provided an appetite and energy levels of 100% when assessed by our nursing staff.  REVIEW OF SYSTEMS:  Review of Systems  Gastrointestinal: Positive for diarrhea.  All other systems reviewed and are negative.    PAST MEDICAL/SURGICAL HISTORY:  Past Medical History:  Diagnosis Date  . Alzheimer's dementia ([place])   . Bradycardia   . Breast cancer ([place])   . Dementia ([place])   . Depression   . Hypokalemia   . Hypotension   . 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 ABDOMINAL HYSTERECTOMY    . BREAST BIOPSY Left   . PORTACATH PLACEMENT Right 05/15/2018   Procedure: INSERTION PORT-A-CATH;  Surgeon: Aviva Signs, MD;  Location: [facility];  Service: General;  Laterality: Right;     SOCIAL  HISTORY:  Social History   Socioeconomic History  . Marital status: Widowed    Spouse name: Not on file  . Number of children: Not on file  . Years of education: Not on file  . Highest education level: Not on file  Occupational History  . Not on file  Tobacco Use  . Smoking status: Unknown If Ever Smoked  . Smokeless tobacco: Never Used  Substance and Sexual Activity  . Alcohol use: Not Currently  . Drug use: Never  . Sexual activity: Not Currently  Other Topics Concern  . Not on file  Social History Narrative   Lives at [facility] in [place] Strain:   . Difficulty of Paying Living Expenses:   Food Insecurity:   . Worried About Charity fundraiser in the Last Year:   . Arboriculturist in the Last Year:   Transportation Needs:   . Film/video editor (Medical):   Marland Kitchen Lack of Transportation (Non-Medical):   Physical Activity:   . Days of Exercise per Week:   . Minutes of Exercise per Session:   Stress:   . Feeling of Stress :   Social Connections:   . Frequency of Communication with Friends and Family:   . Frequency of Social Gatherings with  Friends and Family:   . Attends Religious Services:   . Active Member of Clubs or Organizations:   . Attends Archivist Meetings:   Marland Kitchen Marital Status:   Intimate Partner Violence:   . Fear of Current or Ex-Partner:   . Emotionally Abused:   Marland Kitchen Physically Abused:   . Sexually Abused:     FAMILY HISTORY:  History reviewed. No pertinent family history.  CURRENT MEDICATIONS:  Outpatient Encounter Medications as of 11/06/2019  Medication Sig  . ALPRAZolam (XANAX) 0.5 MG tablet Take 0.5 mg by mouth 3 (three) times daily as needed.   . busPIRone (BUSPAR) 5 MG tablet Take 5 mg by mouth 2 (two) times daily.  . divalproex (DEPAKOTE) 250 MG DR tablet Take 250 mg by mouth at bedtime.   . divalproex (DEPAKOTE) 500 MG DR tablet Take 500 mg by mouth every morning.   .  docusate sodium (COLACE) 100 MG capsule Take 200 mg by mouth daily. May increase to 2 capsules in the am and 2 capsules in the pm. If no BM, call the [place].  . donepezil (ARICEPT) 10 MG tablet Take 10 mg by mouth at bedtime.   . fam-trastuzumab deruxtecan-nxki 5.4 mg/kg in dextrose 5 % 100 mL Inject 5.4 mg/kg into the vein every 21 ( twenty-one) days.  . memantine (NAMENDA) 10 MG tablet Take 10 mg by mouth 2 (two) times daily.  . mirtazapine (REMERON) 7.5 MG tablet Take 7.5 mg by mouth at bedtime.  Marland Kitchen QUEtiapine (SEROQUEL) 25 MG tablet Take 12.5 mg by mouth 3 (three) times daily.   . sertraline (ZOLOFT) 100 MG tablet Take 100 mg by mouth daily.   Marland Kitchen VITAMIN D, CHOLECALCIFEROL, PO Take 2,000 Int'l Units by mouth daily.  Marland Kitchen acetaminophen (TYLENOL) 325 MG tablet Take 650 mg by mouth every 6 (six) hours as needed for mild pain.  Marland Kitchen betamethasone dipropionate (DIPROLENE) 0.05 % cream 2 (two) times daily. Apply to left breast two times a day for dermatitis use on days that resident doesn't go for chemotherapy.  Marland Kitchen HYDROcodone-acetaminophen (NORCO/VICODIN) 5-325 MG tablet Take 1 tablet by mouth every 6 (six) hours as needed.   . lidocaine-prilocaine (EMLA) cream Apply 1 application topically as needed (apply to port site topically every 21 days).  . ondansetron (ZOFRAN) 4 MG tablet Take 1 tablet (4 mg total) by mouth every 8 (eight) hours as needed for nausea or vomiting. (Patient not taking: Reported on 10/16/2019)  . [DISCONTINUED] prochlorperazine (COMPAZINE) 10 MG tablet Take 1 tablet (10 mg total) by mouth every 6 (six) hours as needed (Nausea or vomiting).   No facility-administered encounter medications on file as of 11/06/2019.    ALLERGIES:  No Known Allergies   PHYSICAL EXAM:  ECOG Performance status: 1  Vitals:   11/06/19 0945  BP: (!) 110/42  Pulse: 76  Resp: 18  Temp: (!) 96.9 F (36.1 C)  SpO2: 99%   Filed Weights   11/06/19 0945  Weight: 138 lb 9.6 oz (62.9 kg)     Physical Exam Vitals reviewed.  Constitutional:      Appearance: Normal appearance.  Cardiovascular:     Rate and Rhythm: Normal rate and regular rhythm.     Heart sounds: Normal heart sounds.  Pulmonary:     Effort: Pulmonary effort is normal.     Breath sounds: Normal breath sounds.  Abdominal:     General: There is no distension.     Palpations: Abdomen is soft. There is no mass.  Musculoskeletal:        General: No swelling.  Skin:    General: Skin is warm.  Neurological:     General: No focal deficit present.     Mental Status: She is alert and oriented to person, place, and time.  Psychiatric:        Mood and Affect: Mood normal.        Behavior: Behavior normal.      LABORATORY DATA:  I have reviewed the labs as listed.  CBC    Component Value Date/Time   WBC 9.2 11/06/2019 0940   RBC 3.74 (L) 11/06/2019 0940   HGB 11.5 (L) 11/06/2019 0940   HCT 36.7 11/06/2019 0940   PLT 144 (L) 11/06/2019 0940   MCV 98.1 11/06/2019 0940   MCH 30.7 11/06/2019 0940   MCHC 31.3 11/06/2019 0940   RDW 18.4 (H) 11/06/2019 0940   LYMPHSABS 0.9 11/06/2019 0940   MONOABS 0.7 11/06/2019 0940   EOSABS 0.0 11/06/2019 0940   BASOSABS 0.0 11/06/2019 0940   CMP Latest Ref Rng & Units 11/06/2019 10/16/2019 09/25/2019  Glucose 70 - 99 mg/dL 80 79 85  BUN 8 - 23 mg/dL 6(L) 7(L) 10  Creatinine 0.44 - 1.00 mg/dL 0.43(L) 0.57 0.56  Sodium 135 - 145 mmol/L 141 143 143  Potassium 3.5 - 5.1 mmol/L 3.3(L) 3.5 3.3(L)  Chloride 98 - 111 mmol/L 104 104 104  CO2 22 - 32 mmol/L '28 30 28  ' Calcium 8.9 - 10.3 mg/dL 8.8(L) 8.6(L) 8.5(L)  Total Protein 6.5 - 8.1 g/dL 6.4(L) 6.7 7.0  Total Bilirubin 0.3 - 1.2 mg/dL 0.5 0.4 0.6  Alkaline Phos 38 - 126 U/L 110 96 85  AST 15 - 41 U/L 56(H) 29 32  ALT 0 - 44 U/L '26 13 18       ' DIAGNOSTIC IMAGING:  I have independently reviewed scans.   I have reviewed Venita Lick LPN's note and agree with the documentation.  I personally performed a  face-to-face visit, made revisions and my assessment and plan is as follows.    ASSESSMENT & PLAN:   HER2-positive carcinoma of left breast ([place]) 1.  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 -PET scan on 07/30/2019 showed improving left breast mass, left axillary lymph node metastasis which have essentially resolved.  Improved bilateral lung metastasis.  Tumor marker has normalized on 08/27/2019. -I had a prolonged discussion with the sister on the phone today.  We discussed about her scan results.  She is very concerned that her sister has gotten very weak in the last couple of weeks.  Her mental status also reportedly has changed.  She is more withdrawn as per her sister.  She also has worsening of her dementia.  No focal weakness reported. -I will hold her treatment today.  We will arrange for PET CT scan.  We will also arrange for a brain MRI with and without gadolinium. -We will see her back after scans.  2.  High risk drug monitoring: -Echo on 10/10/2019 shows EF 60-65%.  Prior echo on 04/24/2019 showed similar EF.  3.  Diarrhea: -She reportedly had a lot of diarrhea in the last 2 weeks. -According to sister she is very weak.  Her labs today suggest mild hypokalemia. -We will give her 1 L of fluids with electrolytes today.      Orders placed this encounter:  Orders Placed This Encounter  Procedures  . MR Brain  W Wo Contrast  . NM PET Image Restag (PS) Skull Base To Thigh  . CBC with Differential/Platelet  . Comprehensive metabolic panel  . Cancer antigen 15-3   Total time spent is 40 minutes with more than 70% of time spent face-to-face discussing scan results, lab results, talking to her sister about further plan, counseling and coordination of care.   Derek Jack, MD [facility] [place] [phone]

## 2019-11-07 DIAGNOSIS — C50812 Malignant neoplasm of overlapping sites of left female breast: Secondary | ICD-10-CM | POA: Diagnosis not present

## 2019-11-07 DIAGNOSIS — G309 Alzheimer's disease, unspecified: Secondary | ICD-10-CM | POA: Diagnosis not present

## 2019-11-07 DIAGNOSIS — R197 Diarrhea, unspecified: Secondary | ICD-10-CM | POA: Diagnosis not present

## 2019-11-07 DIAGNOSIS — F0280 Dementia in other diseases classified elsewhere without behavioral disturbance: Secondary | ICD-10-CM | POA: Diagnosis not present

## 2019-11-08 ENCOUNTER — Ambulatory Visit (HOSPITAL_COMMUNITY): Payer: Medicare Other

## 2019-11-19 ENCOUNTER — Other Ambulatory Visit (HOSPITAL_COMMUNITY): Payer: Self-pay | Admitting: *Deleted

## 2019-11-19 DIAGNOSIS — C50912 Malignant neoplasm of unspecified site of left female breast: Secondary | ICD-10-CM

## 2019-11-21 NOTE — Progress Notes (Signed)

## 2019-11-22 ENCOUNTER — Ambulatory Visit (HOSPITAL_COMMUNITY)
Admission: RE | Admit: 2019-11-22 | Discharge: 2019-11-22 | Disposition: A | Discharge status: Home / Self Care | Payer: Medicare Other | Source: Ambulatory Visit | Attending: Hematology | Admitting: Hematology

## 2019-11-22 ENCOUNTER — Other Ambulatory Visit: Payer: Self-pay

## 2019-11-22 DIAGNOSIS — Z79899 Other long term (current) drug therapy: Secondary | ICD-10-CM | POA: Diagnosis not present

## 2019-11-22 DIAGNOSIS — C50912 Malignant neoplasm of unspecified site of left female breast: Secondary | ICD-10-CM | POA: Diagnosis not present

## 2019-11-22 DIAGNOSIS — C7802 Secondary malignant neoplasm of left lung: Secondary | ICD-10-CM | POA: Insufficient documentation

## 2019-11-22 DIAGNOSIS — K5730 Diverticulosis of large intestine without perforation or abscess without bleeding: Secondary | ICD-10-CM | POA: Diagnosis not present

## 2019-11-22 DIAGNOSIS — C7801 Secondary malignant neoplasm of right lung: Secondary | ICD-10-CM | POA: Insufficient documentation

## 2019-11-22 DIAGNOSIS — F0390 Unspecified dementia without behavioral disturbance: Secondary | ICD-10-CM | POA: Diagnosis not present

## 2019-11-22 DIAGNOSIS — G319 Degenerative disease of nervous system, unspecified: Secondary | ICD-10-CM | POA: Diagnosis not present

## 2019-11-22 DIAGNOSIS — I700 Atherosclerosis of aorta: Secondary | ICD-10-CM | POA: Diagnosis not present

## 2019-11-22 DIAGNOSIS — C50919 Malignant neoplasm of unspecified site of unspecified female breast: Secondary | ICD-10-CM | POA: Diagnosis not present

## 2019-11-22 LAB — GLUCOSE, CAPILLARY: Glucose-Capillary: 75 mg/dL (ref 70–99)

## 2019-11-22 MED ORDER — FLUDEOXYGLUCOSE F - 18 (FDG) INJECTION
6.8700 | Freq: Once | INTRAVENOUS | Status: AC
  Administered 2019-11-22: 11:00:00 6.87 via INTRAVENOUS

## 2019-11-22 MED ORDER — GADOBUTROL 1 MMOL/ML IV SOLN
6.0000 mL | Freq: Once | INTRAVENOUS | Status: AC | PRN
  Administered 2019-11-22: 6 mL via INTRAVENOUS

## 2019-11-27 ENCOUNTER — Ambulatory Visit (HOSPITAL_COMMUNITY): Payer: Medicare Other | Admitting: Hematology

## 2019-11-27 ENCOUNTER — Inpatient Hospital Stay (HOSPITAL_COMMUNITY): Payer: Medicare Other | Attending: Hematology | Admitting: Hematology

## 2019-11-27 ENCOUNTER — Ambulatory Visit (HOSPITAL_COMMUNITY): Payer: Medicare Other

## 2019-11-27 ENCOUNTER — Inpatient Hospital Stay (HOSPITAL_COMMUNITY): Payer: Medicare Other

## 2019-11-27 ENCOUNTER — Encounter (HOSPITAL_COMMUNITY): Payer: Self-pay | Admitting: Hematology

## 2019-11-27 ENCOUNTER — Other Ambulatory Visit (HOSPITAL_COMMUNITY): Payer: Medicare Other

## 2019-11-27 ENCOUNTER — Other Ambulatory Visit: Payer: Self-pay

## 2019-11-27 VITALS — BP 91/32 | HR 72 | Temp 97.1°F | Resp 18 | Wt 133.6 lb

## 2019-11-27 VITALS — BP 111/46 | HR 58 | Temp 97.6°F | Resp 18

## 2019-11-27 DIAGNOSIS — C50812 Malignant neoplasm of overlapping sites of left female breast: Secondary | ICD-10-CM | POA: Insufficient documentation

## 2019-11-27 DIAGNOSIS — C50912 Malignant neoplasm of unspecified site of left female breast: Secondary | ICD-10-CM

## 2019-11-27 DIAGNOSIS — Z5111 Encounter for antineoplastic chemotherapy: Secondary | ICD-10-CM | POA: Diagnosis not present

## 2019-11-27 DIAGNOSIS — C7802 Secondary malignant neoplasm of left lung: Secondary | ICD-10-CM | POA: Insufficient documentation

## 2019-11-27 DIAGNOSIS — Z5189 Encounter for other specified aftercare: Secondary | ICD-10-CM | POA: Insufficient documentation

## 2019-11-27 DIAGNOSIS — C7801 Secondary malignant neoplasm of right lung: Secondary | ICD-10-CM | POA: Diagnosis not present

## 2019-11-27 DIAGNOSIS — Z171 Estrogen receptor negative status [ER-]: Secondary | ICD-10-CM

## 2019-11-27 LAB — COMPREHENSIVE METABOLIC PANEL
ALT: 15 U/L (ref 0–44)
AST: 43 U/L — ABNORMAL HIGH (ref 15–41)
Albumin: 3.1 g/dL — ABNORMAL LOW (ref 3.5–5.0)
Alkaline Phosphatase: 87 U/L (ref 38–126)
Anion gap: 10 (ref 5–15)
BUN: 13 mg/dL (ref 8–23)
CO2: 26 mmol/L (ref 22–32)
Calcium: 8.7 mg/dL — ABNORMAL LOW (ref 8.9–10.3)
Chloride: 104 mmol/L (ref 98–111)
Creatinine, Ser: 0.52 mg/dL (ref 0.44–1.00)
GFR calc Af Amer: >60 mL/min (ref >60)
GFR calc non Af Amer: >60 mL/min (ref >60)
Glucose, Bld: 93 mg/dL (ref 70–99)
Potassium: 3.7 mmol/L (ref 3.5–5.1)
Sodium: 140 mmol/L (ref 135–145)
Total Bilirubin: 0.4 mg/dL (ref 0.3–1.2)
Total Protein: 6.7 g/dL (ref 6.5–8.1)

## 2019-11-27 LAB — CBC WITH DIFFERENTIAL/PLATELET
Abs Immature Granulocytes: 0.01 10*3/uL (ref 0.00–0.07)
Basophils Absolute: 0 10*3/uL (ref 0.0–0.1)
Basophils Relative: 0 %
Eosinophils Absolute: 0.1 10*3/uL (ref 0.0–0.5)
Eosinophils Relative: 1 %
HCT: 38.1 % (ref 36.0–46.0)
Hemoglobin: 11.9 g/dL — ABNORMAL LOW (ref 12.0–15.0)
Immature Granulocytes: 0 %
Lymphocytes Relative: 12 %
Lymphs Abs: 0.7 10*3/uL (ref 0.7–4.0)
MCH: 31 pg (ref 26.0–34.0)
MCHC: 31.2 g/dL (ref 30.0–36.0)
MCV: 99.2 fL (ref 80.0–100.0)
Monocytes Absolute: 0.6 10*3/uL (ref 0.1–1.0)
Monocytes Relative: 11 %
Neutro Abs: 4.6 10*3/uL (ref 1.7–7.7)
Neutrophils Relative %: 76 %
Platelets: 132 10*3/uL — ABNORMAL LOW (ref 150–400)
RBC: 3.84 MIL/uL — ABNORMAL LOW (ref 3.87–5.11)
RDW: 14.4 % (ref 11.5–15.5)
WBC: 6.1 10*3/uL (ref 4.0–10.5)
nRBC: 0 % (ref 0.0–0.2)

## 2019-11-27 LAB — MAGNESIUM: Magnesium: 2 mg/dL (ref 1.7–2.4)

## 2019-11-27 MED ORDER — SODIUM CHLORIDE 0.9 % IV SOLN
60.0000 mg/m2 | Freq: Once | INTRAVENOUS | Status: AC
  Administered 2019-11-27: 100 mg via INTRAVENOUS
  Filled 2019-11-27: qty 10

## 2019-11-27 MED ORDER — SODIUM CHLORIDE 0.9 % IV SOLN
10.0000 mg | Freq: Once | INTRAVENOUS | Status: AC
  Administered 2019-11-27: 10 mg via INTRAVENOUS
  Filled 2019-11-27: qty 10

## 2019-11-27 MED ORDER — SODIUM CHLORIDE 0.9% FLUSH
10.0000 mL | INTRAVENOUS | Status: DC | PRN
  Administered 2019-11-27: 09:00:00 10 mL

## 2019-11-27 MED ORDER — HEPARIN SOD (PORK) LOCK FLUSH 100 UNIT/ML IV SOLN
500.0000 [IU] | Freq: Once | INTRAVENOUS | Status: AC | PRN
  Administered 2019-11-27: 500 [IU]

## 2019-11-27 MED ORDER — TRASTUZUMAB-DKST CHEMO 150 MG IV SOLR
450.0000 mg | Freq: Once | INTRAVENOUS | Status: AC
  Administered 2019-11-27: 450 mg via INTRAVENOUS
  Filled 2019-11-27: qty 21.43

## 2019-11-27 MED ORDER — DIPHENHYDRAMINE HCL 25 MG PO CAPS
50.0000 mg | ORAL_CAPSULE | Freq: Once | ORAL | Status: AC
  Administered 2019-11-27: 50 mg via ORAL
  Filled 2019-11-27: qty 2

## 2019-11-27 MED ORDER — SODIUM CHLORIDE 0.9 % IV SOLN: Freq: Once | INTRAVENOUS | Status: AC

## 2019-11-27 MED ORDER — PALONOSETRON HCL INJECTION 0.25 MG/5ML
0.2500 mg | Freq: Once | INTRAVENOUS | Status: AC
  Administered 2019-11-27: 0.25 mg via INTRAVENOUS
  Filled 2019-11-27: qty 5

## 2019-11-27 MED ORDER — ACETAMINOPHEN 325 MG PO TABS
650.0000 mg | ORAL_TABLET | Freq: Once | ORAL | Status: AC
  Administered 2019-11-27: 650 mg via ORAL
  Filled 2019-11-27: qty 2

## 2019-11-27 NOTE — Assessment & Plan Note (Addendum)
1.  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 -I reviewed results of PET scan on 11/22/2019 which showed progression of the metastatic disease in the lungs.  There is also progression of the left breast mass.  No new sites of metastatic disease seen.  MRI of the brain did not reveal any metastatic disease. -Physical examination today shows clear increase in size of the left breast mass with erythema and warmth on the left breast with a tumor nodule. -I have called and talked to her Sister [name] [name].  We had a prolonged discussion about patient's worsening dementia likely from chemotherapy. -We discussed options including best supportive care in the form of hospice versus active therapy. -I have considered treatment options including tucatinib-based regimen.  We did not opt for it as it becomes too complicated at the nursing home to administer oral chemotherapy. -Patient has received docetaxel for 6 cycles in 2019 and has tolerated it extremely well.  She had progression subsequently on Herceptin and Pertuzumab maintenance.  Hence I have recommended combination docetaxel and Herceptin. -We discussed the side effects in detail.  I will start at a dose reduction of 60 mg/m.  We will give growth factor support. -If there is any problems with tolerability, we will consider discontinuation of treatments. -We will evaluate her in 1 week for follow-up and toxicity assessment.  2.  High risk drug monitoring: -Echo on 10/10/2019 shows EF 60 to 65%.  Prior echo on 04/24/2019 showed similar EF.  3.  Diarrhea: -She had diarrhea when she was receiving previous chemotherapy. -Diarrhea has completely stopped at this time.  She will use Imodium as needed if it starts back.

## 2019-11-27 NOTE — Progress Notes (Signed)
ON PATHWAY REGIMEN - Breast  No Change  Continue With Treatment as Ordered.     A cycle is every 21 days:     Fam-trastuzumab deruxtecan-nxki   **Always confirm dose/schedule in your pharmacy ordering system**  Patient Characteristics: Distant Metastases or Locoregional Recurrent Disease - Unresected or Locally Advanced Unresectable Disease Progressing after Neoadjuvant and Local Therapies, HER2 Positive, ER Negative/Unknown, Chemotherapy, Third Line Therapeutic Status: Distant Metastases BRCA Mutation Status: Did Not Order Test ER Status: Negative (-) HER2 Status: Positive (+) PR Status: Negative (-) Line of Therapy: Third Line Intent of Therapy: Non-Curative / Palliative Intent, Discussed with Patient

## 2019-11-27 NOTE — Progress Notes (Signed)
DISCONTINUE ON PATHWAY REGIMEN - Breast     A cycle is every 21 days:     Fam-trastuzumab deruxtecan-nxki   **Always confirm dose/schedule in your pharmacy ordering system**  REASON: Disease Progression PRIOR TREATMENT: [id number]: Fam-trastuzumab Deruxtecan 5.4 mg/kg IV D1 q21 Days TREATMENT RESPONSE: Progressive Disease (PD)  START ON PATHWAY REGIMEN - Breast     Cycle 1: A cycle is 21 days:     Trastuzumab-xxxx      Docetaxel    Cycles 2 and beyond: A cycle is every 21 days:     Trastuzumab-xxxx      Docetaxel   **Always confirm dose/schedule in your pharmacy ordering system**  Patient Characteristics: Distant Metastases or Locoregional Recurrent Disease - Unresected or Locally Advanced Unresectable Disease Progressing after Neoadjuvant and Local Therapies, HER2 Positive, ER Negative/Unknown, Chemotherapy, Fourth Line and Beyond Therapeutic Status: Distant Metastases BRCA Mutation Status: Did Not Order Test ER Status: Negative (-) HER2 Status: Positive (+) PR Status: Negative (-) Line of Therapy: Fourth Line and Beyond Intent of Therapy: Non-Curative / Palliative Intent, Discussed with Patient

## 2019-11-27 NOTE — Patient Instructions (Signed)
[facility] Discharge Instructions for Patients Receiving Chemotherapy  Today you received the following chemotherapy agents   To help prevent nausea and vomiting after your treatment, we encourage you to take your nausea medication   If you develop nausea and vomiting that is not controlled by your nausea medication, call the clinic.   BELOW ARE SYMPTOMS THAT SHOULD BE REPORTED IMMEDIATELY:  *FEVER GREATER THAN 100.5 F  *CHILLS WITH OR WITHOUT FEVER  NAUSEA AND VOMITING THAT IS NOT CONTROLLED WITH YOUR NAUSEA MEDICATION  *UNUSUAL SHORTNESS OF BREATH  *UNUSUAL BRUISING OR BLEEDING  TENDERNESS IN MOUTH AND THROAT WITH OR WITHOUT PRESENCE OF ULCERS  *URINARY PROBLEMS  *BOWEL PROBLEMS  UNUSUAL RASH Items with * indicate a potential emergency and should be followed up as soon as possible.  Feel free to call the clinic should you have any questions or concerns. The clinic phone number is [phone].  Please show the CHEMO ALERT CARD at check-in to the Emergency Department and triage nurse.   

## 2019-11-27 NOTE — Progress Notes (Signed)
Patient has been assessed, vital signs and labs have been reviewed by Dr. Delton Coombes. ANC, Creatinine, LFTs, and Platelets are within treatment parameters per Dr. Delton Coombes. Treatment regimen is changing to Herceptin and Doletaxel.  The patient is good to proceed with treatment at this time.

## 2019-11-27 NOTE — Progress Notes (Signed)
Treatment has changed today per MD. Will be on trastuzumab and docetaxol every 3 weeks per MD. Labs reviewed with MD. No prior authorization needed per [employer] advisor. Will start today as ordered.   Telephone consent obtained with Patient's sister- [name] [name] by 2 RN's today prior to treatment.    Treatment given per orders. Patient tolerated it well without problems. Vitals stable and discharged home from clinic ambulatory. Follow up as scheduled.

## 2019-11-27 NOTE — Progress Notes (Signed)
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      BRIEF ONCOLOGIC HISTORY:  Oncology History  Malignant neoplasm of overlapping sites of left female breast ([place])  05/15/2018 Initial Diagnosis   Malignant neoplasm of overlapping sites of left female breast ([place])   06/05/2018 - 01/17/2019 Chemotherapy   The patient had pegfilgrastim-cbqv (UDENYCA) injection 6 mg, 6 mg, Subcutaneous, Once, 6 of 6 cycles Administration: 6 mg (06/07/2018), 6 mg (06/28/2018), 6 mg (07/19/2018), 6 mg (08/30/2018), 6 mg (09/20/2018), 6 mg (08/10/2018) trastuzumab (HERCEPTIN) 600 mg in sodium chloride 0.9 % 250 mL chemo infusion, 609 mg, Intravenous,  Once, 10 of 13 cycles Administration: 600 mg (06/05/2018), 450 mg (06/26/2018), 450 mg (07/17/2018), 450 mg (08/28/2018), 450 mg (09/18/2018), 450 mg (08/07/2018), 450 mg (10/19/2018), 450 mg (11/09/2018), 450 mg (12/05/2018), 450 mg (12/26/2018) DOCEtaxel (TAXOTERE) 140 mg in sodium chloride 0.9 % 250 mL chemo infusion, 75 mg/m2 = 140 mg, Intravenous,  Once, 6 of 6 cycles Administration: 140 mg (06/05/2018), 140 mg (06/26/2018), 140 mg (07/17/2018), 140 mg (08/28/2018), 140 mg (09/18/2018), 140 mg (08/07/2018) ondansetron (ZOFRAN) 8 mg, dexamethasone (DECADRON) 4 mg in sodium chloride 0.9 % 50 mL IVPB, , Intravenous,  Once, 6 of 6 cycles Administration:  (06/05/2018),  (06/26/2018),  (07/17/2018),  (08/28/2018),  (09/18/2018),  (08/07/2018) pertuzumab (PERJETA) 840 mg in sodium chloride 0.9 % 250 mL chemo infusion, 840 mg, Intravenous, Once, 10 of 13 cycles Administration: 840 mg (06/05/2018), 420 mg (06/26/2018), 420 mg (07/17/2018), 420 mg (08/28/2018), 420 mg (09/18/2018), 420 mg (08/07/2018), 420 mg (10/19/2018), 420 mg  (11/09/2018), 420 mg (12/05/2018), 420 mg (12/26/2018)  for chemotherapy treatment.    05/01/2019 - 11/26/2019 Chemotherapy   The patient had palonosetron (ALOXI) injection 0.25 mg, 0.25 mg, Intravenous,  Once, 8 of 10 cycles Administration: 0.25 mg (05/01/2019), 0.25 mg (05/22/2019), 0.25 mg (08/06/2019), 0.25 mg (08/27/2019), 0.25 mg (06/12/2019), 0.25 mg (07/16/2019), 0.25 mg (09/25/2019), 0.25 mg (10/16/2019) pegfilgrastim-jmdb (FULPHILA) injection 6 mg, 6 mg, Subcutaneous,  Once, 8 of 10 cycles Administration: 6 mg (05/03/2019), 6 mg (05/24/2019), 6 mg (06/14/2019), 6 mg (07/18/2019), 6 mg (08/08/2019), 6 mg (08/29/2019), 6 mg (09/27/2019), 6 mg (10/17/2019) fam-trastuzumab deruxtecan-nxki (ENHERTU) 372 mg in dextrose 5 % 100 mL chemo infusion, 5.4 mg/kg = 372 mg, Intravenous,  Once, 8 of 10 cycles Administration: 372 mg (05/01/2019), 372 mg (05/22/2019), 400 mg (08/06/2019), 400 mg (08/27/2019), 400 mg (06/12/2019), 400 mg (07/16/2019), 372 mg (09/25/2019), 372 mg (10/16/2019)  for chemotherapy treatment.    11/27/2019 -  Chemotherapy   The patient had palonosetron (ALOXI) injection 0.25 mg, 0.25 mg, Intravenous,  Once, 1 of 6 cycles Administration: 0.25 mg (11/27/2019) pegfilgrastim-jmdb (FULPHILA) injection 6 mg, 6 mg, Subcutaneous,  Once, 1 of 6 cycles DOCEtaxel (TAXOTERE) 100 mg in sodium chloride 0.9 % 250 mL chemo infusion, 60 mg/m2 = 100 mg (80 % of original dose 75 mg/m2), Intravenous,  Once, 1 of 6 cycles Dose modification: 60 mg/m2 (80 % of original dose 75 mg/m2, Cycle 1, Reason: Provider Judgment) Administration: 100 mg (11/27/2019) trastuzumab-dkst (OGIVRI) 450 mg in sodium chloride 0.9 % 250 mL chemo infusion, 483 mg, Intravenous,  Once, 1 of 6 cycles Administration: 450 mg (11/27/2019)  for chemotherapy treatment.    HER2-positive  carcinoma of left breast ([place])  05/30/2018 Initial Diagnosis   HER2-positive carcinoma of left breast ([place])   01/26/2019 - 04/26/2019 Chemotherapy   The patient had ado-trastuzumab  emtansine (KADCYLA) 260 mg in sodium chloride 0.9 % 250 mL chemo infusion, 240 mg, Intravenous, Once, 4 of 5 cycles Administration: 260 mg (01/26/2019), 260 mg (02/16/2019), 260 mg (03/09/2019), 260 mg (04/06/2019)  for chemotherapy treatment.    05/01/2019 - 11/26/2019 Chemotherapy   The patient had palonosetron (ALOXI) injection 0.25 mg, 0.25 mg, Intravenous,  Once, 8 of 10 cycles Administration: 0.25 mg (05/01/2019), 0.25 mg (05/22/2019), 0.25 mg (08/06/2019), 0.25 mg (08/27/2019), 0.25 mg (06/12/2019), 0.25 mg (07/16/2019), 0.25 mg (09/25/2019), 0.25 mg (10/16/2019) pegfilgrastim-jmdb (FULPHILA) injection 6 mg, 6 mg, Subcutaneous,  Once, 8 of 10 cycles Administration: 6 mg (05/03/2019), 6 mg (05/24/2019), 6 mg (06/14/2019), 6 mg (07/18/2019), 6 mg (08/08/2019), 6 mg (08/29/2019), 6 mg (09/27/2019), 6 mg (10/17/2019) fam-trastuzumab deruxtecan-nxki (ENHERTU) 372 mg in dextrose 5 % 100 mL chemo infusion, 5.4 mg/kg = 372 mg, Intravenous,  Once, 8 of 10 cycles Administration: 372 mg (05/01/2019), 372 mg (05/22/2019), 400 mg (08/06/2019), 400 mg (08/27/2019), 400 mg (06/12/2019), 400 mg (07/16/2019), 372 mg (09/25/2019), 372 mg (10/16/2019)  for chemotherapy treatment.    11/27/2019 -  Chemotherapy   The patient had palonosetron (ALOXI) injection 0.25 mg, 0.25 mg, Intravenous,  Once, 1 of 6 cycles Administration: 0.25 mg (11/27/2019) pegfilgrastim-jmdb (FULPHILA) injection 6 mg, 6 mg, Subcutaneous,  Once, 1 of 6 cycles DOCEtaxel (TAXOTERE) 100 mg in sodium chloride 0.9 % 250 mL chemo infusion, 60 mg/m2 = 100 mg (80 % of original dose 75 mg/m2), Intravenous,  Once, 1 of 6 cycles Dose modification: 60 mg/m2 (80 % of original dose 75 mg/m2, Cycle 1, Reason: Provider Judgment) Administration: 100 mg (11/27/2019) trastuzumab-dkst (OGIVRI) 450 mg in sodium chloride 0.9 % 250 mL chemo infusion, 483 mg, Intravenous,  Once, 1 of 6 cycles Administration: 450 mg (11/27/2019)  for chemotherapy treatment.       CANCER STAGING: Cancer  Staging No matching staging information was found for the patient.   INTERVAL HISTORY:  [name] [name] 64 y.o. female seen for follow-up of metastatic HER-2 positive left breast cancer and toxicity assessment.  She had PET scan and brain MRI done.  We have called the patient's sister Neoma Laming while during the visit.  Patient's dementia is reportedly gotten worse since last couple of years.  Patient reports that she is not in any pain at this time.  Her diarrhea also improved.  Denies any symptoms of PND or orthopnea.  Denies any tingling or numbness in extremities.  REVIEW OF SYSTEMS:  Review of Systems  All other systems reviewed and are negative.    PAST MEDICAL/SURGICAL HISTORY:  Past Medical History:  Diagnosis Date  . Alzheimer's dementia ([place])   . Bradycardia   . Breast cancer ([place])   . Dementia ([place])   . Depression   . Hypokalemia   . Hypotension   . 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 ABDOMINAL HYSTERECTOMY    . BREAST BIOPSY Left   . PORTACATH PLACEMENT Right 05/15/2018   Procedure: INSERTION PORT-A-CATH;  Surgeon: Aviva Signs, MD;  Location: [facility];  Service: General;  Laterality: Right;     SOCIAL HISTORY:  Social History   Socioeconomic History  . Marital status: Widowed    Spouse name: Not on file  . Number of children:  Not on file  . Years of education: Not on file  . Highest education level: Not on file  Occupational History  . Not on file  Tobacco Use  . Smoking status: Unknown If Ever Smoked  . Smokeless tobacco: Never Used  Substance and Sexual Activity  . Alcohol use: Not Currently  . Drug use: Never  . Sexual activity: Not Currently  Other Topics Concern  . Not on file  Social History Narrative   Lives at [facility] in [place] Strain:   . Difficulty of Paying Living Expenses:   Food Insecurity:    . Worried About Charity fundraiser in the Last Year:   . Arboriculturist in the Last Year:   Transportation Needs:   . Film/video editor (Medical):   Marland Kitchen Lack of Transportation (Non-Medical):   Physical Activity:   . Days of Exercise per Week:   . Minutes of Exercise per Session:   Stress:   . Feeling of Stress :   Social Connections:   . Frequency of Communication with Friends and Family:   . Frequency of Social Gatherings with Friends and Family:   . Attends Religious Services:   . Active Member of Clubs or Organizations:   . Attends Archivist Meetings:   Marland Kitchen Marital Status:   Intimate Partner Violence:   . Fear of Current or Ex-Partner:   . Emotionally Abused:   Marland Kitchen Physically Abused:   . Sexually Abused:     FAMILY HISTORY:  History reviewed. No pertinent family history.  CURRENT MEDICATIONS:  Outpatient Encounter Medications as of 11/27/2019  Medication Sig  . acetaminophen (TYLENOL) 325 MG tablet Take 650 mg by mouth every 6 (six) hours as needed for mild pain.  Marland Kitchen ALPRAZolam (XANAX) 0.5 MG tablet Take 0.5 mg by mouth 3 (three) times daily as needed.   . betamethasone dipropionate (DIPROLENE) 0.05 % cream 2 (two) times daily. Apply to left breast two times a day for dermatitis use on days that resident doesn't go for chemotherapy.  . busPIRone (BUSPAR) 5 MG tablet Take 5 mg by mouth 2 (two) times daily.  . divalproex (DEPAKOTE) 250 MG DR tablet Take 250 mg by mouth at bedtime.   . divalproex (DEPAKOTE) 500 MG DR tablet Take 500 mg by mouth every morning.   . docusate sodium (COLACE) 100 MG capsule Take 200 mg by mouth daily. May increase to 2 capsules in the am and 2 capsules in the pm. If no BM, call the [facility].  . donepezil (ARICEPT) 10 MG tablet Take 10 mg by mouth at bedtime.   . fam-trastuzumab deruxtecan-nxki 5.4 mg/kg in dextrose 5 % 100 mL Inject 5.4 mg/kg into the vein every 21 ( twenty-one) days.  Marland Kitchen HYDROcodone-acetaminophen (NORCO/VICODIN)  5-325 MG tablet Take 1 tablet by mouth every 6 (six) hours as needed.   . lidocaine-prilocaine (EMLA) cream Apply 1 application topically as needed (apply to port site topically every 21 days).  . memantine (NAMENDA) 10 MG tablet Take 10 mg by mouth 2 (two) times daily.  . mirtazapine (REMERON) 7.5 MG tablet Take 7.5 mg by mouth at bedtime.  . ondansetron (ZOFRAN) 4 MG tablet Take 1 tablet (4 mg total) by mouth every 8 (eight) hours as needed for nausea or vomiting. (Patient not taking: Reported on 10/16/2019)  . QUEtiapine (SEROQUEL) 25 MG tablet Take 12.5 mg by mouth 3 (three) times daily.   Marland Kitchen  sertraline (ZOLOFT) 100 MG tablet Take 100 mg by mouth daily.   Marland Kitchen VITAMIN D, CHOLECALCIFEROL, PO Take 2,000 Int'l Units by mouth daily.  . [DISCONTINUED] prochlorperazine (COMPAZINE) 10 MG tablet Take 1 tablet (10 mg total) by mouth every 6 (six) hours as needed (Nausea or vomiting).   No facility-administered encounter medications on file as of 11/27/2019.    ALLERGIES:  No Known Allergies   PHYSICAL EXAM:  ECOG Performance status: 1  Vitals:   11/27/19 0913  BP: (!) 91/32  Pulse: 72  Resp: 18  Temp: (!) 97.1 F (36.2 C)  SpO2: 100%   Filed Weights   11/27/19 0913  Weight: 133 lb 9.6 oz (60.6 kg)    Physical Exam Vitals reviewed.  Constitutional:      Appearance: Normal appearance.  Cardiovascular:     Rate and Rhythm: Normal rate and regular rhythm.     Heart sounds: Normal heart sounds.  Pulmonary:     Effort: Pulmonary effort is normal.     Breath sounds: Normal breath sounds.  Abdominal:     General: There is no distension.     Palpations: Abdomen is soft. There is no mass.  Musculoskeletal:        General: No swelling.  Skin:    General: Skin is warm.  Neurological:     General: No focal deficit present.     Mental Status: She is alert and oriented to person, place, and time.  Psychiatric:        Mood and Affect: Mood normal.        Behavior: Behavior normal.       LABORATORY DATA:  I have reviewed the labs as listed.  CBC    Component Value Date/Time   WBC 6.1 11/27/2019 0922   RBC 3.84 (L) 11/27/2019 0922   HGB 11.9 (L) 11/27/2019 0922   HCT 38.1 11/27/2019 0922   PLT 132 (L) 11/27/2019 0922   MCV 99.2 11/27/2019 0922   MCH 31.0 11/27/2019 0922   MCHC 31.2 11/27/2019 0922   RDW 14.4 11/27/2019 0922   LYMPHSABS 0.7 11/27/2019 0922   MONOABS 0.6 11/27/2019 0922   EOSABS 0.1 11/27/2019 0922   BASOSABS 0.0 11/27/2019 0922   CMP Latest Ref Rng & Units 11/27/2019 11/06/2019 10/16/2019  Glucose 70 - 99 mg/dL 93 80 79  BUN 8 - 23 mg/dL 13 6(L) 7(L)  Creatinine 0.44 - 1.00 mg/dL 0.52 0.43(L) 0.57  Sodium 135 - 145 mmol/L 140 141 143  Potassium 3.5 - 5.1 mmol/L 3.7 3.3(L) 3.5  Chloride 98 - 111 mmol/L 104 104 104  CO2 22 - 32 mmol/L '26 28 30  ' Calcium 8.9 - 10.3 mg/dL 8.7(L) 8.8(L) 8.6(L)  Total Protein 6.5 - 8.1 g/dL 6.7 6.4(L) 6.7  Total Bilirubin 0.3 - 1.2 mg/dL 0.4 0.5 0.4  Alkaline Phos 38 - 126 U/L 87 110 96  AST 15 - 41 U/L 43(H) 56(H) 29  ALT 0 - 44 U/L '15 26 13       ' DIAGNOSTIC IMAGING:  I have independently reviewed the scans.   I have reviewed Venita Lick LPN's note and agree with the documentation.  I personally performed a face-to-face visit, made revisions and my assessment and plan is as follows.    ASSESSMENT & PLAN:   HER2-positive carcinoma of left breast ([place]) 1.  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 -I reviewed results of PET scan on 11/22/2019 which  showed progression of the metastatic disease in the lungs.  There is also progression of the left breast mass.  No new sites of metastatic disease seen.  MRI of the brain did not reveal any metastatic disease. -Physical examination today shows clear increase in size of the left breast mass with erythema and warmth on the left breast with a tumor nodule. -I have called and  talked to her Sister [name] [name].  We had a prolonged discussion about patient's worsening dementia likely from chemotherapy. -We discussed options including best supportive care in the form of hospice versus active therapy. -I have considered treatment options including tucatinib-based regimen.  We did not opt for it as it becomes too complicated at the nursing home to administer oral chemotherapy. -Patient has received docetaxel for 6 cycles in 2019 and has tolerated it extremely well.  She had progression subsequently on Herceptin and Pertuzumab maintenance.  Hence I have recommended combination docetaxel and Herceptin. -We discussed the side effects in detail.  I will start at a dose reduction of 60 mg/m.  We will give growth factor support. -If there is any problems with tolerability, we will consider discontinuation of treatments. -We will evaluate her in 1 week for follow-up and toxicity assessment.  2.  High risk drug monitoring: -Echo on 10/10/2019 shows EF 60 to 65%.  Prior echo on 04/24/2019 showed similar EF.  3.  Diarrhea: -She had diarrhea when she was receiving previous chemotherapy. -Diarrhea has completely stopped at this time.  She will use Imodium as needed if it starts back.       Orders placed this encounter:  Orders Placed This Encounter  Procedures  . CBC with Differential/Platelet  . Comprehensive metabolic panel  . Lactate dehydrogenase  . Magnesium   Total time spent is 40 minutes with more than 70% of the time spent face-to-face discussing scan results, treatment plan change, side effect discussion, counseling and coordination of care.   Derek Jack, MD [place] [phone]

## 2019-11-27 NOTE — Patient Instructions (Addendum)
[place] at [facility] Discharge Instructions  You were seen today by Dr. Delton Coombes. He went over your recent lab results.  Dr. Delton Coombes is changing your chemotherapy regimen.  He will see you back in one week for labs and follow up.   Thank you for choosing [place] at [facility] to provide your oncology and hematology care.  To afford each patient quality time with our provider, please arrive at least 15 minutes before your scheduled appointment time.   If you have a lab appointment with the [facility] please come in thru the  Main Entrance and check in at the main information desk  You need to re-schedule your appointment should you arrive 10 or more minutes late.  We strive to give you quality time with our providers, and arriving late affects you and other patients whose appointments are after yours.  Also, if you no show three or more times for appointments you may be dismissed from the clinic at the providers discretion.     Again, thank you for choosing [facility].  Our hope is that these requests will decrease the amount of time that you wait before being seen by our physicians.       _____________________________________________________________  Should you have questions after your visit to [facility], please contact our office at [phone] between the hours of 8:00 a.m. and 4:30 p.m.  Voicemails left after 4:00 p.m. will not be returned until the following business day.  For prescription refill requests, have your pharmacy contact our office and allow 72 hours.    Cancer Center Support Programs:   > Cancer Support Group  2nd Tuesday of the month 1pm-2pm, Journey Room

## 2019-11-29 ENCOUNTER — Encounter (HOSPITAL_COMMUNITY): Payer: Self-pay

## 2019-11-29 ENCOUNTER — Other Ambulatory Visit: Payer: Self-pay

## 2019-11-29 ENCOUNTER — Inpatient Hospital Stay (HOSPITAL_COMMUNITY): Payer: Medicare Other

## 2019-11-29 ENCOUNTER — Ambulatory Visit (HOSPITAL_COMMUNITY): Payer: Medicare Other

## 2019-11-29 VITALS — BP 96/50 | HR 94 | Temp 97.9°F | Resp 18

## 2019-11-29 DIAGNOSIS — Z5189 Encounter for other specified aftercare: Secondary | ICD-10-CM | POA: Diagnosis not present

## 2019-11-29 DIAGNOSIS — C50812 Malignant neoplasm of overlapping sites of left female breast: Secondary | ICD-10-CM | POA: Diagnosis not present

## 2019-11-29 DIAGNOSIS — Z171 Estrogen receptor negative status [ER-]: Secondary | ICD-10-CM

## 2019-11-29 DIAGNOSIS — C7802 Secondary malignant neoplasm of left lung: Secondary | ICD-10-CM | POA: Diagnosis not present

## 2019-11-29 DIAGNOSIS — C50912 Malignant neoplasm of unspecified site of left female breast: Secondary | ICD-10-CM

## 2019-11-29 DIAGNOSIS — C7801 Secondary malignant neoplasm of right lung: Secondary | ICD-10-CM | POA: Diagnosis not present

## 2019-11-29 DIAGNOSIS — Z5111 Encounter for antineoplastic chemotherapy: Secondary | ICD-10-CM | POA: Diagnosis not present

## 2019-11-29 MED ORDER — PEGFILGRASTIM-JMDB 6 MG/0.6ML ~~LOC~~ SOSY
6.0000 mg | PREFILLED_SYRINGE | Freq: Once | SUBCUTANEOUS | Status: AC
  Administered 2019-11-29: 12:00:00 6 mg via SUBCUTANEOUS
  Filled 2019-11-29: qty 0.6

## 2019-11-29 NOTE — Progress Notes (Signed)
[name] [name] tolerated Fulphila injection well without complaints or incident VSS Pt discharged via wheelchair in satisfactory condition accompanied by her caregiver

## 2019-11-29 NOTE — Patient Instructions (Signed)
[facility] at [facility] Discharge Instructions  Received Fulphila injection today. Follow-up as scheduled. Call clinic for any questions or concerns   Thank you for choosing [facility] at [facility] to provide your oncology and hematology care.  To afford each patient quality time with our provider, please arrive at least 15 minutes before your scheduled appointment time.   If you have a lab appointment with the Cancer Center please come in thru the Main Entrance and check in at the main information desk.  You need to re-schedule your appointment should you arrive 10 or more minutes late.  We strive to give you quality time with our providers, and arriving late affects you and other patients whose appointments are after yours.  Also, if you no show three or more times for appointments you may be dismissed from the clinic at the providers discretion.     Again, thank you for choosing [facility].  Our hope is that these requests will decrease the amount of time that you wait before being seen by our physicians.       _____________________________________________________________  Should you have questions after your visit to [facility], please contact our office at [phone] between the hours of 8:00 a.m. and 4:30 p.m.  Voicemails left after 4:00 p.m. will not be returned until the following business day.  For prescription refill requests, have your pharmacy contact our office and allow 72 hours.    Due to Covid, you will need to wear a mask upon entering the hospital. If you do not have a mask, a mask will be given to you at the Main Entrance upon arrival. For doctor visits, patients may have 1 support person with them. For treatment visits, patients can not have anyone with them due to social distancing guidelines and our immunocompromised population.     

## 2019-12-01 DIAGNOSIS — E559 Vitamin D deficiency, unspecified: Secondary | ICD-10-CM | POA: Diagnosis not present

## 2019-12-01 DIAGNOSIS — E639 Nutritional deficiency, unspecified: Secondary | ICD-10-CM | POA: Diagnosis not present

## 2019-12-01 DIAGNOSIS — M6281 Muscle weakness (generalized): Secondary | ICD-10-CM | POA: Diagnosis not present

## 2019-12-01 DIAGNOSIS — I10 Essential (primary) hypertension: Secondary | ICD-10-CM | POA: Diagnosis not present

## 2019-12-04 ENCOUNTER — Ambulatory Visit (HOSPITAL_COMMUNITY): Payer: Medicare Other | Admitting: Nurse Practitioner

## 2019-12-04 ENCOUNTER — Other Ambulatory Visit (HOSPITAL_COMMUNITY): Payer: Medicare Other

## 2019-12-04 DIAGNOSIS — G309 Alzheimer's disease, unspecified: Secondary | ICD-10-CM | POA: Diagnosis not present

## 2019-12-04 DIAGNOSIS — C50812 Malignant neoplasm of overlapping sites of left female breast: Secondary | ICD-10-CM | POA: Diagnosis not present

## 2019-12-04 DIAGNOSIS — F0280 Dementia in other diseases classified elsewhere without behavioral disturbance: Secondary | ICD-10-CM | POA: Diagnosis not present

## 2019-12-04 DIAGNOSIS — F320 Major depressive disorder, single episode, mild: Secondary | ICD-10-CM | POA: Diagnosis not present

## 2019-12-06 ENCOUNTER — Other Ambulatory Visit: Payer: Self-pay

## 2019-12-06 ENCOUNTER — Inpatient Hospital Stay (HOSPITAL_COMMUNITY): Payer: Medicare Other

## 2019-12-06 ENCOUNTER — Encounter (HOSPITAL_COMMUNITY): Payer: Self-pay | Admitting: Nurse Practitioner

## 2019-12-06 ENCOUNTER — Inpatient Hospital Stay (HOSPITAL_BASED_OUTPATIENT_CLINIC_OR_DEPARTMENT_OTHER): Payer: Medicare Other | Admitting: Nurse Practitioner

## 2019-12-06 DIAGNOSIS — C7802 Secondary malignant neoplasm of left lung: Secondary | ICD-10-CM | POA: Diagnosis not present

## 2019-12-06 DIAGNOSIS — Z5111 Encounter for antineoplastic chemotherapy: Secondary | ICD-10-CM | POA: Diagnosis not present

## 2019-12-06 DIAGNOSIS — C7801 Secondary malignant neoplasm of right lung: Secondary | ICD-10-CM | POA: Diagnosis not present

## 2019-12-06 DIAGNOSIS — C50912 Malignant neoplasm of unspecified site of left female breast: Secondary | ICD-10-CM

## 2019-12-06 DIAGNOSIS — Z5189 Encounter for other specified aftercare: Secondary | ICD-10-CM | POA: Diagnosis not present

## 2019-12-06 DIAGNOSIS — C50812 Malignant neoplasm of overlapping sites of left female breast: Secondary | ICD-10-CM | POA: Diagnosis not present

## 2019-12-06 LAB — COMPREHENSIVE METABOLIC PANEL
ALT: 12 U/L (ref 0–44)
AST: 22 U/L (ref 15–41)
Albumin: 2.9 g/dL — ABNORMAL LOW (ref 3.5–5.0)
Alkaline Phosphatase: 93 U/L (ref 38–126)
Anion gap: 11 (ref 5–15)
BUN: 12 mg/dL (ref 8–23)
CO2: 29 mmol/L (ref 22–32)
Calcium: 8.7 mg/dL — ABNORMAL LOW (ref 8.9–10.3)
Chloride: 102 mmol/L (ref 98–111)
Creatinine, Ser: 0.52 mg/dL (ref 0.44–1.00)
GFR calc Af Amer: >60 mL/min (ref >60)
GFR calc non Af Amer: >60 mL/min (ref >60)
Glucose, Bld: 86 mg/dL (ref 70–99)
Potassium: 3.7 mmol/L (ref 3.5–5.1)
Sodium: 142 mmol/L (ref 135–145)
Total Bilirubin: 0.4 mg/dL (ref 0.3–1.2)
Total Protein: 6.8 g/dL (ref 6.5–8.1)

## 2019-12-06 LAB — CBC WITH DIFFERENTIAL/PLATELET
Band Neutrophils: 8 %
Basophils Absolute: 0 10*3/uL (ref 0.0–0.1)
Basophils Relative: 0 %
Eosinophils Absolute: 0.1 10*3/uL (ref 0.0–0.5)
Eosinophils Relative: 1 %
HCT: 37.9 % (ref 36.0–46.0)
Hemoglobin: 12 g/dL (ref 12.0–15.0)
Lymphocytes Relative: 21 %
Lymphs Abs: 1.8 10*3/uL (ref 0.7–4.0)
MCH: 31.1 pg (ref 26.0–34.0)
MCHC: 31.7 g/dL (ref 30.0–36.0)
MCV: 98.2 fL (ref 80.0–100.0)
Metamyelocytes Relative: 6 %
Monocytes Absolute: 0.9 10*3/uL (ref 0.1–1.0)
Monocytes Relative: 10 %
Myelocytes: 3 %
Neutro Abs: 5.1 10*3/uL (ref 1.7–7.7)
Neutrophils Relative %: 50 %
Platelets: 242 10*3/uL (ref 150–400)
Promyelocytes Relative: 1 %
RBC: 3.86 MIL/uL — ABNORMAL LOW (ref 3.87–5.11)
RDW: 14.1 % (ref 11.5–15.5)
WBC: 8.8 10*3/uL (ref 4.0–10.5)
nRBC: 0.3 % — ABNORMAL HIGH (ref 0.0–0.2)

## 2019-12-06 LAB — MAGNESIUM: Magnesium: 2.2 mg/dL (ref 1.7–2.4)

## 2019-12-06 LAB — LACTATE DEHYDROGENASE: LDH: 131 U/L (ref 98–192)

## 2019-12-06 MED ORDER — SODIUM CHLORIDE 0.9% FLUSH
20.0000 mL | INTRAVENOUS | Status: AC | PRN
  Administered 2019-12-06: 20 mL via INTRAVENOUS

## 2019-12-06 MED ORDER — HEPARIN SOD (PORK) LOCK FLUSH 100 UNIT/ML IV SOLN
500.0000 [IU] | Freq: Once | INTRAVENOUS | Status: AC
  Administered 2019-12-06: 500 [IU] via INTRAVENOUS

## 2019-12-06 NOTE — Patient Instructions (Signed)
[place] at [facility] Discharge InstructioL Labs drawn from portacath today  Thank you for choosing [place] at [facility] to provide your oncology and hematology care.  To afford each patient quality time with our provider, please arrive at least 15 minutes before your scheduled appointment time.   If you have a lab appointment with the [facility] please come in thru the Main Entrance and check in at the main information desk.  You need to re-schedule your appointment should you arrive 10 or more minutes late.  We strive to give you quality time with our providers, and arriving late affects you and other patients whose appointments are after yours.  Also, if you no show three or more times for appointments you may be dismissed from the clinic at the providers discretion.     Again, thank you for choosing [facility].  Our hope is that these requests will decrease the amount of time that you wait before being seen by our physicians.       _____________________________________________________________  Should you have questions after your visit to [facility], please contact our office at [phone] between the hours of 8:00 a.m. and 4:30 p.m.  Voicemails left after 4:00 p.m. will not be returned until the following business day.  For prescription refill requests, have your pharmacy contact our office and allow 72 hours.    Due to Covid, you will need to wear a mask upon entering the hospital. If you do not have a mask, a mask will be given to you at the Main Entrance upon arrival. For doctor visits, patients may have 1 support person with them. For treatment visits, patients can not have anyone with them due to social distancing guidelines and our immunocompromised population.

## 2019-12-06 NOTE — Addendum Note (Signed)
Addended by: Anselmo Rod on: 12/06/2019 10:51 AM   Modules accepted: Orders

## 2019-12-06 NOTE — Progress Notes (Signed)
[name] [name], [name] [name]   CLINIC:  Medical Oncology/Hematology  PCP:  [name] Nasuti, MD [address] [name] [address] [phone]   REASON FOR VISIT: Follow-up for breast cancer  CURRENT THERAPY: Docetaxel and Herceptin  BRIEF ONCOLOGIC HISTORY:  Oncology History  Malignant neoplasm of overlapping sites of left female breast ([place])  05/15/2018 Initial Diagnosis   Malignant neoplasm of overlapping sites of left female breast ([place])   06/05/2018 - 01/17/2019 Chemotherapy   The patient had pegfilgrastim-cbqv (UDENYCA) injection 6 mg, 6 mg, Subcutaneous, Once, 6 of 6 cycles Administration: 6 mg (06/07/2018), 6 mg (06/28/2018), 6 mg (07/19/2018), 6 mg (08/30/2018), 6 mg (09/20/2018), 6 mg (08/10/2018) trastuzumab (HERCEPTIN) 600 mg in sodium chloride 0.9 % 250 mL chemo infusion, 609 mg, Intravenous,  Once, 10 of 13 cycles Administration: 600 mg (06/05/2018), 450 mg (06/26/2018), 450 mg (07/17/2018), 450 mg (08/28/2018), 450 mg (09/18/2018), 450 mg (08/07/2018), 450 mg (10/19/2018), 450 mg (11/09/2018), 450 mg (12/05/2018), 450 mg (12/26/2018) DOCEtaxel (TAXOTERE) 140 mg in sodium chloride 0.9 % 250 mL chemo infusion, 75 mg/m2 = 140 mg, Intravenous,  Once, 6 of 6 cycles Administration: 140 mg (06/05/2018), 140 mg (06/26/2018), 140 mg (07/17/2018), 140 mg (08/28/2018), 140 mg (09/18/2018), 140 mg (08/07/2018) ondansetron (ZOFRAN) 8 mg, dexamethasone (DECADRON) 4 mg in sodium chloride 0.9 % 50 mL IVPB, , Intravenous,  Once, 6 of 6 cycles Administration:  (06/05/2018),  (06/26/2018),  (07/17/2018),  (08/28/2018),  (09/18/2018),  (08/07/2018) pertuzumab (PERJETA) 840 mg in sodium chloride 0.9 % 250 mL chemo infusion, 840 mg, Intravenous, Once, 10 of 13 cycles Administration: 840 mg (06/05/2018), 420 mg (06/26/2018), 420 mg (07/17/2018), 420 mg (08/28/2018), 420 mg (09/18/2018), 420 mg (08/07/2018), 420 mg (10/19/2018), 420 mg (11/09/2018), 420 mg (12/05/2018), 420 mg  (12/26/2018)  for chemotherapy treatment.    05/01/2019 - 11/26/2019 Chemotherapy   The patient had palonosetron (ALOXI) injection 0.25 mg, 0.25 mg, Intravenous,  Once, 8 of 10 cycles Administration: 0.25 mg (05/01/2019), 0.25 mg (05/22/2019), 0.25 mg (08/06/2019), 0.25 mg (08/27/2019), 0.25 mg (06/12/2019), 0.25 mg (07/16/2019), 0.25 mg (09/25/2019), 0.25 mg (10/16/2019) pegfilgrastim-jmdb (FULPHILA) injection 6 mg, 6 mg, Subcutaneous,  Once, 8 of 10 cycles Administration: 6 mg (05/03/2019), 6 mg (05/24/2019), 6 mg (06/14/2019), 6 mg (07/18/2019), 6 mg (08/08/2019), 6 mg (08/29/2019), 6 mg (09/27/2019), 6 mg (10/17/2019) fam-trastuzumab deruxtecan-nxki (ENHERTU) 372 mg in dextrose 5 % 100 mL chemo infusion, 5.4 mg/kg = 372 mg, Intravenous,  Once, 8 of 10 cycles Administration: 372 mg (05/01/2019), 372 mg (05/22/2019), 400 mg (08/06/2019), 400 mg (08/27/2019), 400 mg (06/12/2019), 400 mg (07/16/2019), 372 mg (09/25/2019), 372 mg (10/16/2019)  for chemotherapy treatment.    11/27/2019 -  Chemotherapy   The patient had palonosetron (ALOXI) injection 0.25 mg, 0.25 mg, Intravenous,  Once, 1 of 6 cycles Administration: 0.25 mg (11/27/2019) pegfilgrastim-jmdb (FULPHILA) injection 6 mg, 6 mg, Subcutaneous,  Once, 1 of 6 cycles Administration: 6 mg (11/29/2019) DOCEtaxel (TAXOTERE) 100 mg in sodium chloride 0.9 % 250 mL chemo infusion, 60 mg/m2 = 100 mg (80 % of original dose 75 mg/m2), Intravenous,  Once, 1 of 6 cycles Dose modification: 60 mg/m2 (80 % of original dose 75 mg/m2, Cycle 1, Reason: Provider Judgment) Administration: 100 mg (11/27/2019) trastuzumab-dkst (OGIVRI) 450 mg in sodium chloride 0.9 % 250 mL chemo infusion, 483 mg, Intravenous,  Once, 1 of 6 cycles Administration: 450 mg (11/27/2019)  for chemotherapy treatment.    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    05/01/2019 - 11/26/2019 Chemotherapy   The patient had palonosetron (ALOXI) injection 0.25 mg, 0.25 mg, Intravenous,  Once, 8 of 10 cycles Administration: 0.25 mg (05/01/2019), 0.25 mg (05/22/2019), 0.25 mg (08/06/2019), 0.25 mg (08/27/2019), 0.25 mg (06/12/2019), 0.25 mg (07/16/2019), 0.25 mg (09/25/2019), 0.25 mg (10/16/2019) pegfilgrastim-jmdb (FULPHILA) injection 6 mg, 6 mg, Subcutaneous,  Once, 8 of 10 cycles Administration: 6 mg (05/03/2019), 6 mg (05/24/2019), 6 mg (06/14/2019), 6 mg (07/18/2019), 6 mg (08/08/2019), 6 mg (08/29/2019), 6 mg (09/27/2019), 6 mg (10/17/2019) fam-trastuzumab deruxtecan-nxki (ENHERTU) 372 mg in dextrose 5 % 100 mL chemo infusion, 5.4 mg/kg = 372 mg, Intravenous,  Once, 8 of 10 cycles Administration: 372 mg (05/01/2019), 372 mg (05/22/2019), 400 mg (08/06/2019), 400 mg (08/27/2019), 400 mg (06/12/2019), 400 mg (07/16/2019), 372 mg (09/25/2019), 372 mg (10/16/2019)  for chemotherapy treatment.    11/27/2019 -  Chemotherapy   The patient had palonosetron (ALOXI) injection 0.25 mg, 0.25 mg, Intravenous,  Once, 1 of 6 cycles Administration: 0.25 mg (11/27/2019) pegfilgrastim-jmdb (FULPHILA) injection 6 mg, 6 mg, Subcutaneous,  Once, 1 of 6 cycles Administration: 6 mg (11/29/2019) DOCEtaxel (TAXOTERE) 100 mg in sodium chloride 0.9 % 250 mL chemo infusion, 60 mg/m2 = 100 mg (80 % of original dose 75 mg/m2), Intravenous,  Once, 1 of 6 cycles Dose modification: 60 mg/m2 (80 % of original dose 75 mg/m2, Cycle 1, Reason: Provider Judgment) Administration: 100 mg (11/27/2019) trastuzumab-dkst (OGIVRI) 450 mg in sodium chloride 0.9 % 250 mL chemo infusion, 483 mg, Intravenous,  Once, 1 of 6 cycles Administration: 450 mg (11/27/2019)  for chemotherapy treatment.       INTERVAL  HISTORY:  Ms. [name] [name] 64 y.o. female returns for routine follow-up for breast cancer.  Patient reports she is feeling well since her last treatment.  She reports she has good days and bad days where she is more fatigued than others.  She does have occasional nausea.  However she reports she is eating and drinking plenty.  Her appetite has decreased however she feels she is getting enough nutrition. Denies any nausea, vomiting, or diarrhea. Denies any new pains. Had not noticed any recent bleeding such as epistaxis, hematuria or hematochezia. Denies recent chest pain on exertion, shortness of breath on minimal exertion, pre-syncopal episodes, or palpitations. Denies any numbness or tingling in hands or feet. Denies any recent fevers, infections, or recent hospitalizations. Patient reports appetite at 100% and energy level at 50%.    REVIEW OF SYSTEMS:  Review of Systems  Constitutional: Positive for fatigue.  All other systems reviewed and are negative.    PAST MEDICAL/SURGICAL HISTORY:  Past Medical History:  Diagnosis Date  . Alzheimer's dementia ([place])   . Bradycardia   . Breast cancer ([place])   . Dementia ([place])   . Depression   . Hypokalemia   . Hypotension   . 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 ABDOMINAL HYSTERECTOMY    . BREAST BIOPSY Left   . PORTACATH PLACEMENT Right 05/15/2018   Procedure: INSERTION PORT-A-CATH;  Surgeon: Aviva Signs, MD;  Location:  [facility];  Service: General;  Laterality: Right;     SOCIAL HISTORY:  Social History   Socioeconomic History  . Marital status: Widowed    Spouse name: Not on file  . Number of children: Not on file  . Years of education: Not on file  . Highest education level: Not on file  Occupational History  . Not on file  Tobacco Use  . Smoking status: Unknown If Ever Smoked  . Smokeless tobacco: Never Used  Substance and Sexual Activity  .  Alcohol use: Not Currently  . Drug use: Never  . Sexual activity: Not Currently  Other Topics Concern  . Not on file  Social History Narrative   Lives at [facility] in [place] Strain:   . Difficulty of Paying Living Expenses:   Food Insecurity:   . Worried About Charity fundraiser in the Last Year:   . Arboriculturist in the Last Year:   Transportation Needs:   . Film/video editor (Medical):   Marland Kitchen Lack of Transportation (Non-Medical):   Physical Activity:   . Days of Exercise per Week:   . Minutes of Exercise per Session:   Stress:   . Feeling of Stress :   Social Connections:   . Frequency of Communication with Friends and Family:   . Frequency of Social Gatherings with Friends and Family:   . Attends Religious Services:   . Active Member of Clubs or Organizations:   . Attends Archivist Meetings:   Marland Kitchen Marital Status:   Intimate Partner Violence:   . Fear of Current or Ex-Partner:   . Emotionally Abused:   Marland Kitchen Physically Abused:   . Sexually Abused:     FAMILY HISTORY:  History reviewed. No pertinent family history.  CURRENT MEDICATIONS:  Outpatient Encounter Medications as of 12/06/2019  Medication Sig  . acetaminophen (TYLENOL) 325 MG tablet Take 650 mg by mouth every 6 (six) hours as needed for mild pain.  Marland Kitchen ALPRAZolam (XANAX) 0.5 MG tablet Take 0.5 mg by mouth 3 (three) times daily as needed.   . betamethasone dipropionate (DIPROLENE) 0.05 % cream 2 (two) times daily. Apply to left breast two times a day for dermatitis use on days that resident doesn't go for chemotherapy.  . busPIRone (BUSPAR) 5 MG tablet Take 5 mg by mouth 2 (two) times daily.  . divalproex (DEPAKOTE) 250 MG DR tablet Take 250 mg by mouth at bedtime.   . divalproex (DEPAKOTE) 500 MG DR tablet Take 500 mg by mouth every morning.   . docusate sodium (COLACE) 100 MG capsule Take 200 mg by mouth daily. May increase to 2 capsules  in the am and 2 capsules in the pm. If no BM, call the [facility].  . donepezil (ARICEPT) 10 MG tablet Take 10 mg by mouth at bedtime.   . fam-trastuzumab deruxtecan-nxki 5.4 mg/kg in dextrose 5 % 100 mL Inject 5.4 mg/kg into the vein every 21 ( twenty-one) days.  Marland Kitchen HYDROcodone-acetaminophen (NORCO/VICODIN) 5-325 MG tablet Take 1 tablet by mouth every 6 (six) hours as needed.   . lidocaine-prilocaine (EMLA) cream Apply 1 application topically as needed (apply to port site topically every 21 days).  . memantine (NAMENDA) 10 MG tablet Take 10 mg by mouth 2 (two) times daily.  . mirtazapine (REMERON) 7.5 MG tablet Take 7.5 mg by mouth at bedtime.  . ondansetron (ZOFRAN) 4 MG tablet Take 1 tablet (  4 mg total) by mouth every 8 (eight) hours as needed for nausea or vomiting.  Marland Kitchen QUEtiapine (SEROQUEL) 25 MG tablet Take 12.5 mg by mouth 3 (three) times daily.   . sertraline (ZOLOFT) 100 MG tablet Take 100 mg by mouth daily.   Marland Kitchen VITAMIN D, CHOLECALCIFEROL, PO Take 2,000 Int'l Units by mouth daily.  . [DISCONTINUED] prochlorperazine (COMPAZINE) 10 MG tablet Take 1 tablet (10 mg total) by mouth every 6 (six) hours as needed (Nausea or vomiting).   No facility-administered encounter medications on file as of 12/06/2019.    ALLERGIES:  No Known Allergies   PHYSICAL EXAM:  ECOG Performance status: 1  Vitals:   12/06/19 0950 12/06/19 0954  BP: (!) 87/27 (!) 102/56  Pulse: 74 (!) 103  Resp: 18 18  Temp: (!) 96.9 F (36.1 C)   SpO2: 98% 98%   Filed Weights   12/06/19 0950  Weight: 141 lb (64 kg)    Physical Exam Constitutional:      Appearance: Normal appearance. She is normal weight.  Cardiovascular:     Rate and Rhythm: Normal rate and regular rhythm.     Heart sounds: Normal heart sounds.  Pulmonary:     Effort: Pulmonary effort is normal.     Breath sounds: Normal breath sounds.  Abdominal:     General: Bowel sounds are normal.     Palpations: Abdomen is soft.  Musculoskeletal:         General: Normal range of motion.  Skin:    General: Skin is warm.  Neurological:     Mental Status: She is alert and oriented to person, place, and time. Mental status is at baseline.  Psychiatric:        Mood and Affect: Mood normal.        Behavior: Behavior normal.        Thought Content: Thought content normal.        Judgment: Judgment normal.      LABORATORY DATA:  I have reviewed the labs as listed.  CBC    Component Value Date/Time   WBC PENDING 12/06/2019 0954   RBC 3.86 (L) 12/06/2019 0954   HGB 12.0 12/06/2019 0954   HCT 37.9 12/06/2019 0954   PLT 242 12/06/2019 0954   MCV 98.2 12/06/2019 0954   MCH 31.1 12/06/2019 0954   MCHC 31.7 12/06/2019 0954   RDW 14.1 12/06/2019 0954   LYMPHSABS PENDING 12/06/2019 0954   MONOABS PENDING 12/06/2019 0954   EOSABS PENDING 12/06/2019 0954   BASOSABS PENDING 12/06/2019 0954   CMP Latest Ref Rng & Units 12/06/2019 11/27/2019 11/06/2019  Glucose 70 - 99 mg/dL 86 93 80  BUN 8 - 23 mg/dL 12 13 6(L)  Creatinine 0.44 - 1.00 mg/dL 0.52 0.52 0.43(L)  Sodium 135 - 145 mmol/L 142 140 141  Potassium 3.5 - 5.1 mmol/L 3.7 3.7 3.3(L)  Chloride 98 - 111 mmol/L 102 104 104  CO2 22 - 32 mmol/L '29 26 28  ' Calcium 8.9 - 10.3 mg/dL 8.7(L) 8.7(L) 8.8(L)  Total Protein 6.5 - 8.1 g/dL 6.8 6.7 6.4(L)  Total Bilirubin 0.3 - 1.2 mg/dL 0.4 0.4 0.5  Alkaline Phos 38 - 126 U/L 93 87 110  AST 15 - 41 U/L 22 43(H) 56(H)  ALT 0 - 44 U/L '12 15 26     ' I personally performed a face-to-face visit,   All questions were answered to patient's stated satisfaction. Encouraged patient to call with any new concerns or questions before his next  visit to the cancer center and we can certain see him sooner, if needed.     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 -PET CT scan on 11/22/2019 which showed  progression of metastatic disease in the lungs.  There is also progression in the left breast mass.  No new sites of metastatic disease seen.  MRI of the brain did not reveal any metastatic disease. -It has been discussed with the patient hospice versus active therapy.  Patient chose active therapy at this time. -Docetaxel and Herceptin were started.  It was started at a dose reduction of 60 mg/m.  Growth factor support is also being given. -If there is any problems with tolerability will consider discontinuation of treatments. -Her last treatment was given on 11/27/2019.  She tolerated this well with no side effects.  She reports occasional nausea but does not want any nausea medication today.  She reports she is eating and drinking appropriately. -Labs done on 12/06/2019 showed potassium 3.7, creatinine 0.52, magnesium 2.2, LDH 131, hemoglobin 12.0, platelets 242 -She will follow-up in a week week and a half with treatment and labs.  2.  High risk drug monitoring: -Echo on 04/23/2020 showed EF of 60%. -Echo on 10/10/2019 showed EF 60 to 65%.         Orders placed this encounter:  Orders Placed This Encounter  Procedures  . Lactate dehydrogenase  . Magnesium  . CBC with Differential/Platelet  . Comprehensive metabolic panel     Francene Finders, FNP-C [place] [phone]

## 2019-12-06 NOTE — Assessment & Plan Note (Addendum)
1.  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 -PET CT scan on 11/22/2019 which showed progression of metastatic disease in the lungs.  There is also progression in the left breast mass.  No new sites of metastatic disease seen.  MRI of the brain did not reveal any metastatic disease. -It has been discussed with the patient hospice versus active therapy.  Patient chose active therapy at this time. -Docetaxel and Herceptin were started.  It was started at a dose reduction of 60 mg/m.  Growth factor support is also being given. -If there is any problems with tolerability will consider discontinuation of treatments. -Her last treatment was given on 11/27/2019.  She tolerated this well with no side effects.  She reports occasional nausea but does not want any nausea medication today.  She reports she is eating and drinking appropriately. -Labs done on 12/06/2019 showed potassium 3.7, creatinine 0.52, magnesium 2.2, LDH 131, hemoglobin 12.0, platelets 242 -She will follow-up in a week week and a half with treatment and labs.  2.  High risk drug monitoring: -Echo on 04/23/2020 showed EF of 60%. -Echo on 10/10/2019 showed EF 60 to 65%.

## 2019-12-06 NOTE — Patient Instructions (Signed)
[facility] Discharge Instructions     Thank you for choosing [facility] at [facility] to provide your oncology and hematology care.  To afford each patient quality time with our provider, please arrive at least 15 minutes before your scheduled appointment time.   If you have a lab appointment with the Cancer Center please come in thru the Main Entrance and check in at the main information desk.  You need to re-schedule your appointment should you arrive 10 or more minutes late.  We strive to give you quality time with our providers, and arriving late affects you and other patients whose appointments are after yours.  Also, if you no show three or more times for appointments you may be dismissed from the clinic at the providers discretion.     Again, thank you for choosing [facility].  Our hope is that these requests will decrease the amount of time that you wait before being seen by our physicians.       _____________________________________________________________  Should you have questions after your visit to  Cancer Center, please contact our office at [phone] between the hours of 8:00 a.m. and 4:30 p.m.  Voicemails left after 4:00 p.m. will not be returned until the following business day.  For prescription refill requests, have your pharmacy contact our office and allow 72 hours.    Due to Covid, you will need to wear a mask upon entering the hospital. If you do not have a mask, a mask will be given to you at the Main Entrance upon arrival. For doctor visits, patients may have 1 support person with them. For treatment visits, patients can not have anyone with them due to social distancing guidelines and our immunocompromised population.      

## 2019-12-06 NOTE — Progress Notes (Signed)
[name] [name] tolerated port lab draw well without complaints or incident. Pt seen by RLockamy NP and no new orders obtained Pt discharged self ambulatory in satisfactory condition accompanied by caregiver

## 2019-12-12 NOTE — Progress Notes (Signed)

## 2019-12-14 DIAGNOSIS — K5900 Constipation, unspecified: Secondary | ICD-10-CM | POA: Diagnosis not present

## 2019-12-14 DIAGNOSIS — G8929 Other chronic pain: Secondary | ICD-10-CM | POA: Diagnosis not present

## 2019-12-14 DIAGNOSIS — G309 Alzheimer's disease, unspecified: Secondary | ICD-10-CM | POA: Diagnosis not present

## 2019-12-14 DIAGNOSIS — I10 Essential (primary) hypertension: Secondary | ICD-10-CM | POA: Diagnosis not present

## 2019-12-18 ENCOUNTER — Other Ambulatory Visit: Payer: Self-pay

## 2019-12-18 ENCOUNTER — Inpatient Hospital Stay (HOSPITAL_BASED_OUTPATIENT_CLINIC_OR_DEPARTMENT_OTHER): Payer: Medicare Other | Admitting: Hematology

## 2019-12-18 ENCOUNTER — Inpatient Hospital Stay (HOSPITAL_COMMUNITY): Payer: Medicare Other

## 2019-12-18 ENCOUNTER — Encounter (HOSPITAL_COMMUNITY): Payer: Self-pay | Admitting: Hematology

## 2019-12-18 ENCOUNTER — Encounter (HOSPITAL_COMMUNITY): Payer: Self-pay

## 2019-12-18 VITALS — BP 117/65 | HR 63 | Temp 96.9°F

## 2019-12-18 VITALS — BP 91/56 | HR 65 | Temp 96.8°F | Wt 133.4 lb

## 2019-12-18 DIAGNOSIS — C50912 Malignant neoplasm of unspecified site of left female breast: Secondary | ICD-10-CM

## 2019-12-18 DIAGNOSIS — C7801 Secondary malignant neoplasm of right lung: Secondary | ICD-10-CM | POA: Diagnosis not present

## 2019-12-18 DIAGNOSIS — Z5111 Encounter for antineoplastic chemotherapy: Secondary | ICD-10-CM | POA: Diagnosis not present

## 2019-12-18 DIAGNOSIS — C50812 Malignant neoplasm of overlapping sites of left female breast: Secondary | ICD-10-CM

## 2019-12-18 DIAGNOSIS — Z171 Estrogen receptor negative status [ER-]: Secondary | ICD-10-CM

## 2019-12-18 DIAGNOSIS — C7802 Secondary malignant neoplasm of left lung: Secondary | ICD-10-CM | POA: Diagnosis not present

## 2019-12-18 DIAGNOSIS — Z5189 Encounter for other specified aftercare: Secondary | ICD-10-CM | POA: Diagnosis not present

## 2019-12-18 LAB — COMPREHENSIVE METABOLIC PANEL
ALT: 11 U/L (ref 0–44)
AST: 22 U/L (ref 15–41)
Albumin: 3.1 g/dL — ABNORMAL LOW (ref 3.5–5.0)
Alkaline Phosphatase: 92 U/L (ref 38–126)
Anion gap: 9 (ref 5–15)
BUN: 14 mg/dL (ref 8–23)
CO2: 27 mmol/L (ref 22–32)
Calcium: 8.7 mg/dL — ABNORMAL LOW (ref 8.9–10.3)
Chloride: 103 mmol/L (ref 98–111)
Creatinine, Ser: 0.44 mg/dL (ref 0.44–1.00)
GFR calc Af Amer: >60 mL/min (ref >60)
GFR calc non Af Amer: >60 mL/min (ref >60)
Glucose, Bld: 81 mg/dL (ref 70–99)
Potassium: 4.5 mmol/L (ref 3.5–5.1)
Sodium: 139 mmol/L (ref 135–145)
Total Bilirubin: 0.3 mg/dL (ref 0.3–1.2)
Total Protein: 6.5 g/dL (ref 6.5–8.1)

## 2019-12-18 LAB — CBC WITH DIFFERENTIAL/PLATELET
Abs Immature Granulocytes: 0.02 10*3/uL (ref 0.00–0.07)
Basophils Absolute: 0 10*3/uL (ref 0.0–0.1)
Basophils Relative: 0 %
Eosinophils Absolute: 0 10*3/uL (ref 0.0–0.5)
Eosinophils Relative: 0 %
HCT: 36.4 % (ref 36.0–46.0)
Hemoglobin: 11.4 g/dL — ABNORMAL LOW (ref 12.0–15.0)
Immature Granulocytes: 0 %
Lymphocytes Relative: 11 %
Lymphs Abs: 0.8 10*3/uL (ref 0.7–4.0)
MCH: 31.1 pg (ref 26.0–34.0)
MCHC: 31.3 g/dL (ref 30.0–36.0)
MCV: 99.5 fL (ref 80.0–100.0)
Monocytes Absolute: 0.4 10*3/uL (ref 0.1–1.0)
Monocytes Relative: 6 %
Neutro Abs: 6 10*3/uL (ref 1.7–7.7)
Neutrophils Relative %: 83 %
Platelets: 142 10*3/uL — ABNORMAL LOW (ref 150–400)
RBC: 3.66 MIL/uL — ABNORMAL LOW (ref 3.87–5.11)
RDW: 15.4 % (ref 11.5–15.5)
WBC: 7.2 10*3/uL (ref 4.0–10.5)
nRBC: 0 % (ref 0.0–0.2)

## 2019-12-18 LAB — LACTATE DEHYDROGENASE: LDH: 113 U/L (ref 98–192)

## 2019-12-18 LAB — MAGNESIUM: Magnesium: 2.3 mg/dL (ref 1.7–2.4)

## 2019-12-18 MED ORDER — TRASTUZUMAB-DKST CHEMO 150 MG IV SOLR
6.0000 mg/kg | Freq: Once | INTRAVENOUS | Status: AC
  Administered 2019-12-18: 12:00:00 357 mg via INTRAVENOUS
  Filled 2019-12-18: qty 17

## 2019-12-18 MED ORDER — HEPARIN SOD (PORK) LOCK FLUSH 100 UNIT/ML IV SOLN
500.0000 [IU] | Freq: Once | INTRAVENOUS | Status: AC | PRN
  Administered 2019-12-18: 14:00:00 500 [IU]

## 2019-12-18 MED ORDER — DIPHENHYDRAMINE HCL 25 MG PO CAPS
50.0000 mg | ORAL_CAPSULE | Freq: Once | ORAL | Status: AC
  Administered 2019-12-18: 11:00:00 50 mg via ORAL
  Filled 2019-12-18: qty 2

## 2019-12-18 MED ORDER — PALONOSETRON HCL INJECTION 0.25 MG/5ML
0.2500 mg | Freq: Once | INTRAVENOUS | Status: AC
  Administered 2019-12-18: 11:00:00 0.25 mg via INTRAVENOUS
  Filled 2019-12-18: qty 5

## 2019-12-18 MED ORDER — ACETAMINOPHEN 325 MG PO TABS
650.0000 mg | ORAL_TABLET | Freq: Once | ORAL | Status: AC
  Administered 2019-12-18: 650 mg via ORAL
  Filled 2019-12-18: qty 2

## 2019-12-18 MED ORDER — SODIUM CHLORIDE 0.9% FLUSH
10.0000 mL | INTRAVENOUS | Status: DC | PRN
  Administered 2019-12-18: 14:00:00 10 mL

## 2019-12-18 MED ORDER — SODIUM CHLORIDE 0.9 % IV SOLN
60.0000 mg/m2 | Freq: Once | INTRAVENOUS | Status: AC
  Administered 2019-12-18: 13:00:00 100 mg via INTRAVENOUS
  Filled 2019-12-18: qty 10

## 2019-12-18 MED ORDER — SODIUM CHLORIDE 0.9 % IV SOLN: Freq: Once | INTRAVENOUS | Status: AC

## 2019-12-18 MED ORDER — SODIUM CHLORIDE 0.9 % IV SOLN
10.0000 mg | Freq: Once | INTRAVENOUS | Status: AC
  Administered 2019-12-18: 11:00:00 10 mg via INTRAVENOUS
  Filled 2019-12-18: qty 10

## 2019-12-18 NOTE — Assessment & Plan Note (Signed)
1. 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 His PET scan on 2019-11-22 showed progressive metastatic disease in the lungs with progression of the left breast mass. No new metastatic sites. MRI of the brain was negative. -Cycle 1 of docetaxel and Herceptin on 2019-11-27. -She has tolerated 1st cycle reasonably well. Denies any tingling or numbness next MDS. -I reviewed her CBC which showed white count 7.2 and platelet count 172. LFTs are normal. -She will proceed with cycle 2 today. I will dose reduce docetaxel at 60 mg/m. She will also receive growth factor support. We tried calling her Sister Neoma Laming but were unsuccessful. -We will reevaluate her in 3 weeks. I plan to repeat scans after cycle 3.  2. High risk drug monitoring: -2D echo on 2019-10-10 shows EF 60-65%. We will plan to repeat echo periodically.

## 2019-12-18 NOTE — Progress Notes (Signed)
[facility] 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      BRIEF ONCOLOGIC HISTORY:  Oncology History  Malignant neoplasm of overlapping sites of left female breast ([place])  05/15/2018 Initial Diagnosis   Malignant neoplasm of overlapping sites of left female breast ([place])   06/05/2018 - 01/17/2019 Chemotherapy   The patient had pegfilgrastim-cbqv (UDENYCA) injection 6 mg, 6 mg, Subcutaneous, Once, 6 of 6 cycles Administration: 6 mg (06/07/2018), 6 mg (06/28/2018), 6 mg (07/19/2018), 6 mg (08/30/2018), 6 mg (09/20/2018), 6 mg (08/10/2018) trastuzumab (HERCEPTIN) 600 mg in sodium chloride 0.9 % 250 mL chemo infusion, 609 mg, Intravenous,  Once, 10 of 13 cycles Administration: 600 mg (06/05/2018), 450 mg (06/26/2018), 450 mg (07/17/2018), 450 mg (08/28/2018), 450 mg (09/18/2018), 450 mg (08/07/2018), 450 mg (10/19/2018), 450 mg (11/09/2018), 450 mg (12/05/2018), 450 mg (12/26/2018) DOCEtaxel (TAXOTERE) 140 mg in sodium chloride 0.9 % 250 mL chemo infusion, 75 mg/m2 = 140 mg, Intravenous,  Once, 6 of 6 cycles Administration: 140 mg (06/05/2018), 140 mg (06/26/2018), 140 mg (07/17/2018), 140 mg (08/28/2018), 140 mg (09/18/2018), 140 mg (08/07/2018) ondansetron (ZOFRAN) 8 mg, dexamethasone (DECADRON) 4 mg in sodium chloride 0.9 % 50 mL IVPB, , Intravenous,  Once, 6 of 6 cycles Administration:  (06/05/2018),  (06/26/2018),  (07/17/2018),  (08/28/2018),  (09/18/2018),  (08/07/2018) pertuzumab (PERJETA) 840 mg in sodium chloride 0.9 % 250 mL chemo infusion, 840 mg, Intravenous, Once, 10 of 13 cycles Administration: 840 mg (06/05/2018), 420 mg (06/26/2018), 420 mg (07/17/2018), 420 mg (08/28/2018), 420 mg (09/18/2018), 420 mg (08/07/2018), 420 mg (10/19/2018), 420 mg (11/09/2018), 420 mg (12/05/2018), 420 mg  (12/26/2018)  for chemotherapy treatment.    05/01/2019 - 11/26/2019 Chemotherapy   The patient had palonosetron (ALOXI) injection 0.25 mg, 0.25 mg, Intravenous,  Once, 8 of 10 cycles Administration: 0.25 mg (05/01/2019), 0.25 mg (05/22/2019), 0.25 mg (08/06/2019), 0.25 mg (08/27/2019), 0.25 mg (06/12/2019), 0.25 mg (07/16/2019), 0.25 mg (09/25/2019), 0.25 mg (10/16/2019) pegfilgrastim-jmdb (FULPHILA) injection 6 mg, 6 mg, Subcutaneous,  Once, 8 of 10 cycles Administration: 6 mg (05/03/2019), 6 mg (05/24/2019), 6 mg (06/14/2019), 6 mg (07/18/2019), 6 mg (08/08/2019), 6 mg (08/29/2019), 6 mg (09/27/2019), 6 mg (10/17/2019) fam-trastuzumab deruxtecan-nxki (ENHERTU) 372 mg in dextrose 5 % 100 mL chemo infusion, 5.4 mg/kg = 372 mg, Intravenous,  Once, 8 of 10 cycles Administration: 372 mg (05/01/2019), 372 mg (05/22/2019), 400 mg (08/06/2019), 400 mg (08/27/2019), 400 mg (06/12/2019), 400 mg (07/16/2019), 372 mg (09/25/2019), 372 mg (10/16/2019)  for chemotherapy treatment.    11/27/2019 -  Chemotherapy   The patient had palonosetron (ALOXI) injection 0.25 mg, 0.25 mg, Intravenous,  Once, 2 of 6 cycles Administration: 0.25 mg (11/27/2019), 0.25 mg (12/18/2019) pegfilgrastim-jmdb (FULPHILA) injection 6 mg, 6 mg, Subcutaneous,  Once, 2 of 6 cycles Administration: 6 mg (11/29/2019) DOCEtaxel (TAXOTERE) 100 mg in sodium chloride 0.9 % 250 mL chemo infusion, 60 mg/m2 = 100 mg (80 % of original dose 75 mg/m2), Intravenous,  Once, 2 of 6 cycles Dose modification: 60 mg/m2 (80 % of original dose 75 mg/m2, Cycle 1, Reason: Provider Judgment), 60 mg/m2 (80 % of original dose 75 mg/m2, Cycle 2, Reason: Provider Judgment) Administration: 100 mg (11/27/2019), 100 mg (12/18/2019) trastuzumab-dkst (OGIVRI) 450 mg in sodium chloride 0.9 % 250 mL chemo infusion, 483 mg, Intravenous,  Once, 2 of 6 cycles Administration: 450 mg (11/27/2019), 357 mg (12/18/2019)  for chemotherapy treatment.    HER2-positive carcinoma of left breast ([place])  05/30/2018 Initial  Diagnosis   HER2-positive carcinoma of left breast ([place])   01/26/2019 - 04/26/2019 Chemotherapy   The patient had ado-trastuzumab emtansine (KADCYLA) 260 mg in sodium chloride 0.9 % 250 mL chemo infusion, 240 mg, Intravenous, Once, 4 of 5 cycles Administration: 260 mg (01/26/2019), 260 mg (02/16/2019), 260 mg (03/09/2019), 260 mg (04/06/2019)  for chemotherapy treatment.    05/01/2019 - 11/26/2019 Chemotherapy   The patient had palonosetron (ALOXI) injection 0.25 mg, 0.25 mg, Intravenous,  Once, 8 of 10 cycles Administration: 0.25 mg (05/01/2019), 0.25 mg (05/22/2019), 0.25 mg (08/06/2019), 0.25 mg (08/27/2019), 0.25 mg (06/12/2019), 0.25 mg (07/16/2019), 0.25 mg (09/25/2019), 0.25 mg (10/16/2019) pegfilgrastim-jmdb (FULPHILA) injection 6 mg, 6 mg, Subcutaneous,  Once, 8 of 10 cycles Administration: 6 mg (05/03/2019), 6 mg (05/24/2019), 6 mg (06/14/2019), 6 mg (07/18/2019), 6 mg (08/08/2019), 6 mg (08/29/2019), 6 mg (09/27/2019), 6 mg (10/17/2019) fam-trastuzumab deruxtecan-nxki (ENHERTU) 372 mg in dextrose 5 % 100 mL chemo infusion, 5.4 mg/kg = 372 mg, Intravenous,  Once, 8 of 10 cycles Administration: 372 mg (05/01/2019), 372 mg (05/22/2019), 400 mg (08/06/2019), 400 mg (08/27/2019), 400 mg (06/12/2019), 400 mg (07/16/2019), 372 mg (09/25/2019), 372 mg (10/16/2019)  for chemotherapy treatment.    11/27/2019 -  Chemotherapy   The patient had palonosetron (ALOXI) injection 0.25 mg, 0.25 mg, Intravenous,  Once, 2 of 6 cycles Administration: 0.25 mg (11/27/2019), 0.25 mg (12/18/2019) pegfilgrastim-jmdb (FULPHILA) injection 6 mg, 6 mg, Subcutaneous,  Once, 2 of 6 cycles Administration: 6 mg (11/29/2019) DOCEtaxel (TAXOTERE) 100 mg in sodium chloride 0.9 % 250 mL chemo infusion, 60 mg/m2 = 100 mg (80 % of original dose 75 mg/m2), Intravenous,  Once, 2 of 6 cycles Dose modification: 60 mg/m2 (80 % of original dose 75 mg/m2, Cycle 1, Reason: Provider Judgment), 60 mg/m2 (80 % of original dose 75 mg/m2, Cycle 2, Reason: Provider  Judgment) Administration: 100 mg (11/27/2019), 100 mg (12/18/2019) trastuzumab-dkst (OGIVRI) 450 mg in sodium chloride 0.9 % 250 mL chemo infusion, 483 mg, Intravenous,  Once, 2 of 6 cycles Administration: 450 mg (11/27/2019), 357 mg (12/18/2019)  for chemotherapy treatment.       CANCER STAGING: Cancer Staging No matching staging information was found for the patient.   INTERVAL HISTORY:  Ms. Calandra 64 y.o. female seen for follow-up of metastatic HER-2 positive left breast cancer and tox adjustment prior to next cycle of chemotherapy. She had her 1st cycle on 2019-11-27. She reported no major side effects. Denies any tingling or numbness in extremities. Appetite and energy levels are 75%. Denies any new onset pains. No fevers or infections noted.  REVIEW OF SYSTEMS:  Review of Systems  All other systems reviewed and are negative.    PAST MEDICAL/SURGICAL HISTORY:  Past Medical History:  Diagnosis Date  . Alzheimer's dementia ([place])   . Bradycardia   . Breast cancer ([place])   . Dementia ([place])   . Depression   . Hypokalemia   . Hypotension   . 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 ABDOMINAL HYSTERECTOMY    . BREAST BIOPSY Left   . PORTACATH PLACEMENT Right 05/15/2018   Procedure: INSERTION PORT-A-CATH;  Surgeon: Aviva Signs, MD;  Location: [facility];  Service: General;  Laterality: Right;     SOCIAL HISTORY:  Social History  Socioeconomic History  . Marital status: Widowed    Spouse name: Not on file  . Number of children: Not on file  . Years of education: Not on file  . Highest education level: Not on file  Occupational History  . Not on file  Tobacco Use  . Smoking status: Unknown If Ever Smoked  . Smokeless tobacco: Never Used  Substance and Sexual Activity  . Alcohol use: Not Currently  . Drug use: Never  . Sexual activity: Not Currently  Other Topics Concern  . Not on file   Social History Narrative   Lives at [facility] in [place] Strain:   . Difficulty of Paying Living Expenses:   Food Insecurity:   . Worried About Charity fundraiser in the Last Year:   . Arboriculturist in the Last Year:   Transportation Needs:   . Film/video editor (Medical):   Marland Kitchen Lack of Transportation (Non-Medical):   Physical Activity:   . Days of Exercise per Week:   . Minutes of Exercise per Session:   Stress:   . Feeling of Stress :   Social Connections:   . Frequency of Communication with Friends and Family:   . Frequency of Social Gatherings with Friends and Family:   . Attends Religious Services:   . Active Member of Clubs or Organizations:   . Attends Archivist Meetings:   Marland Kitchen Marital Status:   Intimate Partner Violence:   . Fear of Current or Ex-Partner:   . Emotionally Abused:   Marland Kitchen Physically Abused:   . Sexually Abused:     FAMILY HISTORY:  History reviewed. No pertinent family history.  CURRENT MEDICATIONS:  Outpatient Encounter Medications as of 12/18/2019  Medication Sig  . betamethasone dipropionate (DIPROLENE) 0.05 % cream 2 (two) times daily. Apply to left breast two times a day for dermatitis use on days that resident doesn't go for chemotherapy.  . busPIRone (BUSPAR) 5 MG tablet Take 5 mg by mouth 2 (two) times daily.  . divalproex (DEPAKOTE) 250 MG DR tablet Take 250 mg by mouth at bedtime.   . divalproex (DEPAKOTE) 500 MG DR tablet Take 500 mg by mouth every morning.   . docusate sodium (COLACE) 100 MG capsule Take 200 mg by mouth daily. May increase to 2 capsules in the am and 2 capsules in the pm. If no BM, call the [facility].  . donepezil (ARICEPT) 10 MG tablet Take 10 mg by mouth at bedtime.   . fam-trastuzumab deruxtecan-nxki 5.4 mg/kg in dextrose 5 % 100 mL Inject 5.4 mg/kg into the vein every 21 ( twenty-one) days.  . memantine (NAMENDA) 10 MG tablet Take 10 mg by  mouth 2 (two) times daily.  . mirtazapine (REMERON) 7.5 MG tablet Take 7.5 mg by mouth at bedtime.  Marland Kitchen QUEtiapine (SEROQUEL) 25 MG tablet Take 12.5 mg by mouth 3 (three) times daily.   . sertraline (ZOLOFT) 100 MG tablet Take 100 mg by mouth daily.   Marland Kitchen VITAMIN D, CHOLECALCIFEROL, PO Take 2,000 Int'l Units by mouth daily.  Marland Kitchen acetaminophen (TYLENOL) 325 MG tablet Take 650 mg by mouth every 6 (six) hours as needed for mild pain.  Marland Kitchen ALPRAZolam (XANAX) 0.5 MG tablet Take 0.5 mg by mouth 3 (three) times daily as needed.   Marland Kitchen HYDROcodone-acetaminophen (NORCO/VICODIN) 5-325 MG tablet Take 1 tablet by mouth every 6 (six) hours as needed.   . lidocaine-prilocaine (EMLA)  cream Apply 1 application topically as needed (apply to port site topically every 21 days).  . ondansetron (ZOFRAN) 4 MG tablet Take 1 tablet (4 mg total) by mouth every 8 (eight) hours as needed for nausea or vomiting. (Patient not taking: Reported on 12/18/2019)  . [DISCONTINUED] prochlorperazine (COMPAZINE) 10 MG tablet Take 1 tablet (10 mg total) by mouth every 6 (six) hours as needed (Nausea or vomiting).   Facility-Administered Encounter Medications as of 12/18/2019  Medication  . sodium chloride flush (NS) 0.9 % injection 20 mL    ALLERGIES:  No Known Allergies   PHYSICAL EXAM:  ECOG Performance status: 1  Vitals:   12/18/19 0914  BP: (!) 91/56  Pulse: 65  Temp: (!) 96.8 F (36 C)   Filed Weights   12/18/19 0914  Weight: 133 lb 6.4 oz (60.5 kg)    Physical Exam Vitals reviewed.  Constitutional:      Appearance: Normal appearance.  Cardiovascular:     Rate and Rhythm: Normal rate and regular rhythm.     Heart sounds: Normal heart sounds.  Pulmonary:     Effort: Pulmonary effort is normal.     Breath sounds: Normal breath sounds.  Abdominal:     General: There is no distension.     Palpations: Abdomen is soft. There is no mass.  Musculoskeletal:        General: No swelling.  Skin:    General: Skin is warm.   Neurological:     General: No focal deficit present.     Mental Status: She is alert and oriented to person, place, and time.  Psychiatric:        Mood and Affect: Mood normal.        Behavior: Behavior normal.      LABORATORY DATA:  I have reviewed the labs as listed.  CBC    Component Value Date/Time   WBC 7.2 12/18/2019 0916   RBC 3.66 (L) 12/18/2019 0916   HGB 11.4 (L) 12/18/2019 0916   HCT 36.4 12/18/2019 0916   PLT 142 (L) 12/18/2019 0916   MCV 99.5 12/18/2019 0916   MCH 31.1 12/18/2019 0916   MCHC 31.3 12/18/2019 0916   RDW 15.4 12/18/2019 0916   LYMPHSABS 0.8 12/18/2019 0916   MONOABS 0.4 12/18/2019 0916   EOSABS 0.0 12/18/2019 0916   BASOSABS 0.0 12/18/2019 0916   CMP Latest Ref Rng & Units 12/18/2019 12/06/2019 11/27/2019  Glucose 70 - 99 mg/dL 81 86 93  BUN 8 - 23 mg/dL '14 12 13  ' Creatinine 0.44 - 1.00 mg/dL 0.44 0.52 0.52  Sodium 135 - 145 mmol/L 139 142 140  Potassium 3.5 - 5.1 mmol/L 4.5 3.7 3.7  Chloride 98 - 111 mmol/L 103 102 104  CO2 22 - 32 mmol/L '27 29 26  ' Calcium 8.9 - 10.3 mg/dL 8.7(L) 8.7(L) 8.7(L)  Total Protein 6.5 - 8.1 g/dL 6.5 6.8 6.7  Total Bilirubin 0.3 - 1.2 mg/dL 0.3 0.4 0.4  Alkaline Phos 38 - 126 U/L 92 93 87  AST 15 - 41 U/L 22 22 43(H)  ALT 0 - 44 U/L '11 12 15       ' DIAGNOSTIC IMAGING:  I reviewed her scans.   I have reviewed Venita Lick LPN's note and agree with the documentation.  I personally performed a face-to-face visit, made revisions and my assessment and plan is as follows.    ASSESSMENT & PLAN:   HER2-positive carcinoma of left breast ([place]) 1. Metastatic HER-2 positive left breast  cancer to the lungs: -Progression on trastuzumab deruxtecan after 8 cycles through 2019-10-16. His PET scan on 2019-11-22 showed progressive metastatic disease in the lungs with progression of the left breast mass. No new metastatic sites. MRI of the brain was negative. -Cycle 1 of docetaxel and Herceptin on 2019-11-27. -She has  tolerated 1st cycle reasonably well. Denies any tingling or numbness next MDS. -I reviewed her CBC which showed white count 7.2 and platelet count 172. LFTs are normal. -She will proceed with cycle 2 today. I will dose reduce docetaxel at 60 mg/m. She will also receive growth factor support. We tried calling her Sister Neoma Laming but were unsuccessful. -We will reevaluate her in 3 weeks. I plan to repeat scans after cycle 3.  2. High risk drug monitoring: -2D echo on 2019-10-10 shows EF 60-65%. We will plan to repeat echo periodically.      Orders placed this encounter:  Orders Placed This Encounter  Procedures  . CBC with Differential/Platelet  . Comprehensive metabolic panel  . Lactate dehydrogenase  . Magnesium  . Cancer antigen 27.29  . Cancer antigen 15-3      Derek Jack, MD [facility] [phone]

## 2019-12-18 NOTE — Patient Instructions (Signed)
[facility]  Discharge Instructions:   _______________________________________________________________  Thank you for choosing [facility] at [facility] to provide your oncology and hematology care.  To afford each patient quality time with our providers, please arrive at least 15 minutes before your scheduled appointment.  You need to re-schedule your appointment if you arrive 10 or more minutes late.  We strive to give you quality time with our providers, and arriving late affects you and other patients whose appointments are after yours.  Also, if you no show three or more times for appointments you may be dismissed from the clinic.  Again, thank you for choosing [facility] at [facility]. Our hope is that these requests will allow you access to exceptional care and in a timely manner. _______________________________________________________________  If you have questions after your visit, please contact our office at [phone] between the hours of 8:30 a.m. and 5:00 p.m. Voicemails left after 4:30 p.m. will not be returned until the following business day. _______________________________________________________________  For prescription refill requests, have your pharmacy contact our office. _______________________________________________________________  Recommendations made by the consultant and any test results will be sent to your referring physician. _______________________________________________________________ 

## 2019-12-18 NOTE — Progress Notes (Signed)
Patient has been assessed, vital signs and labs have been reviewed by Dr. Katragadda. ANC, Creatinine, LFTs, and Platelets are within treatment parameters per Dr. Katragadda. The patient is good to proceed with treatment at this time.  

## 2019-12-18 NOTE — Progress Notes (Signed)
Patient presents today for treatment and follow up visit with Dr. Delton Coombes. Vital signs within parameters for treatment. MAR reviewed. Labs pending. Patient denies any pain today. Patient states she has no complaints of any changes since her last visit. Patient's weight today 133.4lbs and her last visit on 12/06/19 141lbs. Nutrition Consult ordered via instant messenger to Jennet Maduro RD.   Message received from [facility], scheduling notified and appointment confirmed for consult per [facility] RD.   Labs within parameters for treatment.   Treatment given today per MD orders. Tolerated infusion without adverse affects. Vital signs stable. No complaints at this time. Discharged from clinic via wheel chair.  F/U with [facility] as scheduled.

## 2019-12-18 NOTE — Patient Instructions (Signed)
[facility] Discharge Instructions  You were seen today by Dr. Katragadda. He went over your recent lab results. He will see you back in 3 weeks for labs, treatment and follow up.   Thank you for choosing [facility] at [facility] to provide your oncology and hematology care.  To afford each patient quality time with our provider, please arrive at least 15 minutes before your scheduled appointment time.   If you have a lab appointment with the Cancer Center please come in thru the  Main Entrance and check in at the main information desk  You need to re-schedule your appointment should you arrive 10 or more minutes late.  We strive to give you quality time with our providers, and arriving late affects you and other patients whose appointments are after yours.  Also, if you no show three or more times for appointments you may be dismissed from the clinic at the providers discretion.     Again, thank you for choosing [facility].  Our hope is that these requests will decrease the amount of time that you wait before being seen by our physicians.       _____________________________________________________________  Should you have questions after your visit to [facility], please contact our office at [phone] between the hours of 8:00 a.m. and 4:30 p.m.  Voicemails left after 4:00 p.m. will not be returned until the following business day.  For prescription refill requests, have your pharmacy contact our office and allow 72 hours.    Cancer Center Support Programs:   > Cancer Support Group  2nd Tuesday of the month 1pm-2pm, Journey Room    

## 2019-12-20 ENCOUNTER — Encounter (HOSPITAL_COMMUNITY): Payer: Self-pay

## 2019-12-20 ENCOUNTER — Ambulatory Visit (HOSPITAL_COMMUNITY): Payer: Medicare Other

## 2019-12-20 ENCOUNTER — Other Ambulatory Visit: Payer: Self-pay

## 2019-12-20 ENCOUNTER — Inpatient Hospital Stay (HOSPITAL_COMMUNITY): Payer: Medicare Other

## 2019-12-20 VITALS — BP 118/57 | HR 92 | Temp 97.5°F | Resp 18

## 2019-12-20 DIAGNOSIS — Z171 Estrogen receptor negative status [ER-]: Secondary | ICD-10-CM

## 2019-12-20 DIAGNOSIS — C50812 Malignant neoplasm of overlapping sites of left female breast: Secondary | ICD-10-CM

## 2019-12-20 DIAGNOSIS — C50912 Malignant neoplasm of unspecified site of left female breast: Secondary | ICD-10-CM

## 2019-12-20 DIAGNOSIS — C7802 Secondary malignant neoplasm of left lung: Secondary | ICD-10-CM | POA: Diagnosis not present

## 2019-12-20 DIAGNOSIS — Z5111 Encounter for antineoplastic chemotherapy: Secondary | ICD-10-CM | POA: Diagnosis not present

## 2019-12-20 DIAGNOSIS — C7801 Secondary malignant neoplasm of right lung: Secondary | ICD-10-CM | POA: Diagnosis not present

## 2019-12-20 DIAGNOSIS — Z5189 Encounter for other specified aftercare: Secondary | ICD-10-CM | POA: Diagnosis not present

## 2019-12-20 MED ORDER — PEGFILGRASTIM-JMDB 6 MG/0.6ML ~~LOC~~ SOSY
6.0000 mg | PREFILLED_SYRINGE | Freq: Once | SUBCUTANEOUS | Status: AC
  Administered 2019-12-20: 6 mg via SUBCUTANEOUS
  Filled 2019-12-20: qty 0.6

## 2019-12-20 NOTE — Progress Notes (Signed)
[name] [name] presents today for injection per the provider's orders. Fulphila administration without incident; injection site WNL; see MAR for injection details.  Patient tolerated procedure well and without incident.  No questions or complaints noted at this time. Pt d/c clinic ambulatory.

## 2019-12-28 ENCOUNTER — Ambulatory Visit (HOSPITAL_COMMUNITY): Payer: Medicare Other

## 2019-12-28 NOTE — Progress Notes (Signed)
Nutrition Assessment   Reason for Assessment:  Referral for weight loss   ASSESSMENT:  64 year old female with metastatic HER2 positive left breast cancer to the lungs.  Past medical history of dementia, bradycardia, depression, psychotic disorder.  Patient currently receiving trastuzumab and docetaxel.   Spoke with patient's sister [name] [name].  [name] [name] is patient's POA and lives out of town.  Has been restricted from seeing patient due to [name].  [name] [name] speaks with [name] [name] daily.  [name] [name] ask that RD call [name] [name] and speak with nurse [name] [name] or [name] [name].    RD called [name] [name] where patient is a resident and spoke with [name] [name].  [name] [name] states that anytime a resident looses 5 lb in month they notify MD.  They have moved patient to a feeding group where she can eat in dining room with other residents.  They have also started medpass 120 ml TID (TwoCal) since Feb. (additional 720 calories and 29 g protein). Patient has 3 meals prepared for her at the [name] [address] including good sources of protein.  If patient does not like something then they offer alternatives/alternative menu.  Menu contains good sources of protein.  [name] [name] reports that patient ate 100% of lunch today (meat, turnip greens and macaroni and cheese).  [name] [name] reports appetite is reduced following the week of chemotherapy.  Patient does not complain of food not tasting the same (taste alterations).  Snacks are offered between breakfast and lunch and lunch and dinner and at bedtime.  Patient does eat some of the snacks at times.  Over the last week or so average intake of meals has been 50-75% consumed.  Patient has also started on remeron at bedtime.    Medications: remeron, colace, zofran, namenda   Labs: reviewed   Anthropometrics:   Height: 62 inches Weight: 133 lb 6.4 oz on 12/18/19 in cancer ctr BMI: 24  [facility] weights 4/15 141 lb 4/6 133 lb 9.6 oz 3/16 138 lb 9.6 oz 2/23 148 lb 2/2 140 lb 8  oz 1/4 149 lb 12.8 oz  11% weight loss in the last 4 months  [name] weights (standing) 5/3 133 lb 6oz 4/26 133 lb 4oz 4/19  131 lb 4oz 4/12 129 lb 6oz 3/22 135 lb 6oz 3/15 139 lb 2 oz 3/9 136 lb 3/1 133 lb 2/22 136 lb 2/15 138 lb 2/9 140 lb 2/1 144 lb   7% weight loss in the last 3 months   NUTRITION DIAGNOSIS: Inadequate oral intake related to cancer, cancer related treatment side effects and possibly dementia as evidenced by 7-11% weight loss in the last 3-4 months depending on facility scales.   INTERVENTION:  [name] to continue to liberalize diet, offer snacks between meals and meal alternatives if patient does not like options. Encourage [name] to have patient add extra condiments to foods for added calories and protein.    Continue medpass TID for added calories and protein [name] [name] offering patient snacks between meals and at bedtime.   Agree with addition of remeron.   Called sister [name] [name] and discussed strategies that have been implemented at [name] [name].      MONITORING, EVALUATION, GOAL: weight trends, intake   Next Visit: PHONE f/u with [name] [name] on May 28th  [name] [name], [name] [name], [name] [name] Registered Dietitian [phone] (pager)

## 2019-12-31 DIAGNOSIS — F320 Major depressive disorder, single episode, mild: Secondary | ICD-10-CM | POA: Diagnosis not present

## 2019-12-31 DIAGNOSIS — F0390 Unspecified dementia without behavioral disturbance: Secondary | ICD-10-CM | POA: Diagnosis not present

## 2019-12-31 DIAGNOSIS — F419 Anxiety disorder, unspecified: Secondary | ICD-10-CM | POA: Diagnosis not present

## 2020-01-07 NOTE — Progress Notes (Signed)
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  PRIOR THERAPY:  6 cycles pegfilgrastim-cbqv (Udenyca), 10 cycles trastuzumab (Herceptin)  CURRENT THERAPY: Continue with planned chemotherapy treatment Taxotere and Ogivri  BRIEF ONCOLOGIC HISTORY:  Oncology History  Malignant neoplasm of overlapping sites of left female breast ([facility])  05/15/2018 Initial Diagnosis   Malignant neoplasm of overlapping sites of left female breast ([place])   06/05/2018 - 01/17/2019 Chemotherapy   The patient had pegfilgrastim-cbqv (UDENYCA) injection 6 mg, 6 mg, Subcutaneous, Once, 6 of 6 cycles Administration: 6 mg (06/07/2018), 6 mg (06/28/2018), 6 mg (07/19/2018), 6 mg (08/30/2018), 6 mg (09/20/2018), 6 mg (08/10/2018) trastuzumab (HERCEPTIN) 600 mg in sodium chloride 0.9 % 250 mL chemo infusion, 609 mg, Intravenous,  Once, 10 of 13 cycles Administration: 600 mg (06/05/2018), 450 mg (06/26/2018), 450 mg (07/17/2018), 450 mg (08/28/2018), 450 mg (09/18/2018), 450 mg (08/07/2018), 450 mg (10/19/2018), 450 mg (11/09/2018), 450 mg (12/05/2018), 450 mg (12/26/2018) DOCEtaxel (TAXOTERE) 140 mg in sodium chloride 0.9 % 250 mL chemo infusion, 75 mg/m2 = 140 mg, Intravenous,  Once, 6 of 6 cycles Administration: 140 mg (06/05/2018), 140 mg (06/26/2018), 140 mg (07/17/2018), 140 mg (08/28/2018), 140 mg (09/18/2018), 140 mg (08/07/2018) ondansetron (ZOFRAN) 8 mg, dexamethasone (DECADRON) 4 mg in sodium chloride 0.9 % 50 mL IVPB, , Intravenous,  Once, 6 of 6 cycles Administration:  (06/05/2018),  (06/26/2018),  (07/17/2018),  (08/28/2018),  (09/18/2018),  (08/07/2018) pertuzumab (PERJETA) 840 mg in sodium chloride 0.9 % 250 mL chemo infusion, 840 mg, Intravenous, Once, 10 of 13 cycles Administration: 840 mg  (06/05/2018), 420 mg (06/26/2018), 420 mg (07/17/2018), 420 mg (08/28/2018), 420 mg (09/18/2018), 420 mg (08/07/2018), 420 mg (10/19/2018), 420 mg (11/09/2018), 420 mg (12/05/2018), 420 mg (12/26/2018)  for chemotherapy treatment.    05/01/2019 - 11/26/2019 Chemotherapy   The patient had palonosetron (ALOXI) injection 0.25 mg, 0.25 mg, Intravenous,  Once, 8 of 10 cycles Administration: 0.25 mg (05/01/2019), 0.25 mg (05/22/2019), 0.25 mg (08/06/2019), 0.25 mg (08/27/2019), 0.25 mg (06/12/2019), 0.25 mg (07/16/2019), 0.25 mg (09/25/2019), 0.25 mg (10/16/2019) pegfilgrastim-jmdb (FULPHILA) injection 6 mg, 6 mg, Subcutaneous,  Once, 8 of 10 cycles Administration: 6 mg (05/03/2019), 6 mg (05/24/2019), 6 mg (06/14/2019), 6 mg (07/18/2019), 6 mg (08/08/2019), 6 mg (08/29/2019), 6 mg (09/27/2019), 6 mg (10/17/2019) fam-trastuzumab deruxtecan-nxki (ENHERTU) 372 mg in dextrose 5 % 100 mL chemo infusion, 5.4 mg/kg = 372 mg, Intravenous,  Once, 8 of 10 cycles Administration: 372 mg (05/01/2019), 372 mg (05/22/2019), 400 mg (08/06/2019), 400 mg (08/27/2019), 400 mg (06/12/2019), 400 mg (07/16/2019), 372 mg (09/25/2019), 372 mg (10/16/2019)  for chemotherapy treatment.    11/27/2019 -  Chemotherapy   The patient had palonosetron (ALOXI) injection 0.25 mg, 0.25 mg, Intravenous,  Once, 3 of 6 cycles Administration: 0.25 mg (11/27/2019), 0.25 mg (12/18/2019), 0.25 mg (01/08/2020) pegfilgrastim-jmdb (FULPHILA) injection 6 mg, 6 mg, Subcutaneous,  Once, 3 of 6 cycles Administration: 6 mg (11/29/2019), 6 mg (12/20/2019) DOCEtaxel (TAXOTERE) 100 mg in sodium chloride 0.9 % 250 mL chemo infusion, 60 mg/m2 = 100 mg (80 % of original dose 75 mg/m2), Intravenous,  Once, 3 of 6 cycles Dose modification: 60 mg/m2 (80 % of original dose 75 mg/m2, Cycle 1, Reason: Provider Judgment), 60 mg/m2 (80 % of original dose 75 mg/m2, Cycle  2, Reason: Provider Judgment) Administration: 100 mg (11/27/2019), 100 mg (12/18/2019), 120 mg (01/08/2020) trastuzumab-dkst (OGIVRI) 450 mg in  sodium chloride 0.9 % 250 mL chemo infusion, 483 mg, Intravenous,  Once, 3 of 6 cycles Administration: 450 mg (11/27/2019), 357 mg (12/18/2019), 357 mg (01/08/2020)  for chemotherapy treatment.    HER2-positive carcinoma of left breast ([place])  05/30/2018 Initial Diagnosis   HER2-positive carcinoma of left breast ([place])   01/26/2019 - 04/26/2019 Chemotherapy   The patient had ado-trastuzumab emtansine (KADCYLA) 260 mg in sodium chloride 0.9 % 250 mL chemo infusion, 240 mg, Intravenous, Once, 4 of 5 cycles Administration: 260 mg (01/26/2019), 260 mg (02/16/2019), 260 mg (03/09/2019), 260 mg (04/06/2019)  for chemotherapy treatment.    05/01/2019 - 11/26/2019 Chemotherapy   The patient had palonosetron (ALOXI) injection 0.25 mg, 0.25 mg, Intravenous,  Once, 8 of 10 cycles Administration: 0.25 mg (05/01/2019), 0.25 mg (05/22/2019), 0.25 mg (08/06/2019), 0.25 mg (08/27/2019), 0.25 mg (06/12/2019), 0.25 mg (07/16/2019), 0.25 mg (09/25/2019), 0.25 mg (10/16/2019) pegfilgrastim-jmdb (FULPHILA) injection 6 mg, 6 mg, Subcutaneous,  Once, 8 of 10 cycles Administration: 6 mg (05/03/2019), 6 mg (05/24/2019), 6 mg (06/14/2019), 6 mg (07/18/2019), 6 mg (08/08/2019), 6 mg (08/29/2019), 6 mg (09/27/2019), 6 mg (10/17/2019) fam-trastuzumab deruxtecan-nxki (ENHERTU) 372 mg in dextrose 5 % 100 mL chemo infusion, 5.4 mg/kg = 372 mg, Intravenous,  Once, 8 of 10 cycles Administration: 372 mg (05/01/2019), 372 mg (05/22/2019), 400 mg (08/06/2019), 400 mg (08/27/2019), 400 mg (06/12/2019), 400 mg (07/16/2019), 372 mg (09/25/2019), 372 mg (10/16/2019)  for chemotherapy treatment.    11/27/2019 -  Chemotherapy   The patient had palonosetron (ALOXI) injection 0.25 mg, 0.25 mg, Intravenous,  Once, 3 of 6 cycles Administration: 0.25 mg (11/27/2019), 0.25 mg (12/18/2019), 0.25 mg (01/08/2020) pegfilgrastim-jmdb (FULPHILA) injection 6 mg, 6 mg, Subcutaneous,  Once, 3 of 6 cycles Administration: 6 mg (11/29/2019), 6 mg (12/20/2019) DOCEtaxel (TAXOTERE) 100 mg in sodium  chloride 0.9 % 250 mL chemo infusion, 60 mg/m2 = 100 mg (80 % of original dose 75 mg/m2), Intravenous,  Once, 3 of 6 cycles Dose modification: 60 mg/m2 (80 % of original dose 75 mg/m2, Cycle 1, Reason: Provider Judgment), 60 mg/m2 (80 % of original dose 75 mg/m2, Cycle 2, Reason: Provider Judgment) Administration: 100 mg (11/27/2019), 100 mg (12/18/2019), 120 mg (01/08/2020) trastuzumab-dkst (OGIVRI) 450 mg in sodium chloride 0.9 % 250 mL chemo infusion, 483 mg, Intravenous,  Once, 3 of 6 cycles Administration: 450 mg (11/27/2019), 357 mg (12/18/2019), 357 mg (01/08/2020)  for chemotherapy treatment.      CANCER STAGING: Cancer Staging No matching staging information was found for the patient.  INTERVAL HISTORY:  [name] [name] 64 y.o. female returns for routine follow-up and consideration for next cycle of chemotherapy.   Due for cycle #3 of Taxotere and Ogivri today.   Overall, she tells me she has been feeling pretty well.   Overall, she feels ready for next cycle of chemo today.    REVIEW OF SYSTEMS:  Review of Systems  All other systems reviewed and are negative.   PAST MEDICAL/SURGICAL HISTORY:  Past Medical History:  Diagnosis Date  . Alzheimer's dementia ([facility])   . Bradycardia   . Breast cancer ([facility])   . Dementia ([facility])   . Depression   . Hypokalemia   . Hypotension   . 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 ABDOMINAL HYSTERECTOMY    . BREAST BIOPSY Left   .  PORTACATH PLACEMENT Right 05/15/2018   Procedure: INSERTION PORT-A-CATH;  Surgeon: Aviva Signs, MD;  Location: [facility];  Service: General;  Laterality: Right;    SOCIAL HISTORY:  Social History   Socioeconomic History  . Marital status: Widowed    Spouse name: Not on file  . Number of children: Not on file  . Years of education: Not on file  . Highest education level: Not on file  Occupational History  . Not on file  Tobacco  Use  . Smoking status: Unknown If Ever Smoked  . Smokeless tobacco: Never Used  Substance and Sexual Activity  . Alcohol use: Not Currently  . Drug use: Never  . Sexual activity: Not Currently  Other Topics Concern  . Not on file  Social History Narrative   Lives at [facility] in [place] Strain:   . Difficulty of Paying Living Expenses:   Food Insecurity:   . Worried About Charity fundraiser in the Last Year:   . Arboriculturist in the Last Year:   Transportation Needs:   . Film/video editor (Medical):   Marland Kitchen Lack of Transportation (Non-Medical):   Physical Activity:   . Days of Exercise per Week:   . Minutes of Exercise per Session:   Stress:   . Feeling of Stress :   Social Connections:   . Frequency of Communication with Friends and Family:   . Frequency of Social Gatherings with Friends and Family:   . Attends Religious Services:   . Active Member of Clubs or Organizations:   . Attends Archivist Meetings:   Marland Kitchen Marital Status:   Intimate Partner Violence:   . Fear of Current or Ex-Partner:   . Emotionally Abused:   Marland Kitchen Physically Abused:   . Sexually Abused:     FAMILY HISTORY:  No family history on file.  CURRENT MEDICATIONS:  Current Outpatient Medications  Medication Sig Dispense Refill  . betamethasone dipropionate (DIPROLENE) 0.05 % cream 2 (two) times daily. Apply to left breast two times a day for dermatitis use on days that resident doesn't go for chemotherapy.    . busPIRone (BUSPAR) 5 MG tablet Take 5 mg by mouth 2 (two) times daily.    . divalproex (DEPAKOTE) 250 MG DR tablet Take 250 mg by mouth at bedtime.     . divalproex (DEPAKOTE) 500 MG DR tablet Take 500 mg by mouth every morning.     . docusate sodium (COLACE) 100 MG capsule Take 200 mg by mouth daily. May increase to 2 capsules in the am and 2 capsules in the pm. If no BM, call the [facility].    . donepezil (ARICEPT) 10  MG tablet Take 10 mg by mouth at bedtime.     . memantine (NAMENDA) 10 MG tablet Take 10 mg by mouth 2 (two) times daily.    . mirtazapine (REMERON) 7.5 MG tablet Take 7.5 mg by mouth at bedtime.    Marland Kitchen QUEtiapine (SEROQUEL) 25 MG tablet Take 12.5 mg by mouth 3 (three) times daily.     . sertraline (ZOLOFT) 100 MG tablet Take 100 mg by mouth daily.     Marland Kitchen VITAMIN D, CHOLECALCIFEROL, PO Take 2,000 Int'l Units by mouth daily.    Marland Kitchen acetaminophen (TYLENOL) 325 MG tablet Take 650 mg by mouth every 6 (six) hours as needed for mild pain.    Marland Kitchen ALPRAZolam (XANAX) 0.5 MG tablet Take 0.5  mg by mouth 3 (three) times daily as needed.     Marland Kitchen HYDROcodone-acetaminophen (NORCO/VICODIN) 5-325 MG tablet Take 1 tablet by mouth every 6 (six) hours as needed.     . lidocaine-prilocaine (EMLA) cream Apply 1 application topically as needed (apply to port site topically every 21 days).    . ondansetron (ZOFRAN) 4 MG tablet Take 1 tablet (4 mg total) by mouth every 8 (eight) hours as needed for nausea or vomiting. (Patient not taking: Reported on 01/08/2020) 30 tablet 1   No current facility-administered medications for this visit.   Facility-Administered Medications Ordered in Other Visits  Medication Dose Route Frequency Provider Last Rate Last Admin  . sodium chloride flush (NS) 0.9 % injection 20 mL  20 mL Intravenous PRN Derek Jack, MD   20 mL at 12/06/19 1045    ALLERGIES:  No Known Allergies  PHYSICAL EXAM:  Performance status (ECOG): 1 - Symptomatic but completely ambulatory  Vitals:   01/08/20 0753  BP: (!) 93/39  Pulse: 74  Resp: 18  Temp: (!) 97.1 F (36.2 C)  SpO2: 99%   Wt Readings from Last 3 Encounters:  01/08/20 134 lb 8 oz (61 kg)  12/18/19 133 lb 6.4 oz (60.5 kg)  12/06/19 141 lb (64 kg)   Physical Exam  Constitutional: She is oriented to person, place, and time. She appears well-developed and well-nourished. No distress. Face mask in place.  HENT:  Head: Normocephalic and  atraumatic.  Right Ear: Hearing normal.  Left Ear: Hearing normal.  Mouth/Throat: Oropharynx is clear and moist and mucous membranes are normal. No oral lesions.  Eyes: Pupils are equal, round, and reactive to light. Conjunctivae and EOM are normal.  Cardiovascular: Normal rate, regular rhythm and normal heart sounds. Exam reveals no gallop and no friction rub.  No murmur heard. Pulmonary/Chest: Effort normal and breath sounds normal. She has no wheezes. She has no rhonchi. She has no rales.  Breast mass stable  Abdominal: Soft. Normal appearance and bowel sounds are normal. She exhibits no mass. There is no hepatosplenomegaly. There is no abdominal tenderness. There is no CVA tenderness.  Musculoskeletal:        General: No tenderness or edema. Normal range of motion.     Cervical back: Normal range of motion and neck supple.  Lymphadenopathy:    She has no cervical adenopathy.       Right cervical: No superficial cervical adenopathy present.   She has no axillary adenopathy.       Right: No inguinal adenopathy present.       Left: No inguinal adenopathy present.  Neurological: She is alert and oriented to person, place, and time.  Skin: Skin is warm, dry and intact. No bruising, no lesion and no rash noted. No erythema.  Psychiatric: She has a normal mood and affect. Her behavior is normal. Judgment and thought content normal.    LABORATORY DATA:  I have reviewed the labs as listed.  CBC Latest Ref Rng & Units 01/08/2020 12/18/2019 12/06/2019  WBC 4.0 - 10.5 K/uL 11.1(H) 7.2 8.8  Hemoglobin 12.0 - 15.0 g/dL 11.8(L) 11.4(L) 12.0  Hematocrit 36.0 - 46.0 % 37.7 36.4 37.9  Platelets 150 - 400 K/uL 209 142(L) 242   CMP Latest Ref Rng & Units 01/08/2020 12/18/2019 12/06/2019  Glucose 70 - 99 mg/dL 98 81 86  BUN 8 - 23 mg/dL _0 Creatinine 0.44 - 1.00 mg/dL 0.50 0.44 0.52  Sodium 135 - 145 mmol/L 140 139  142  Potassium 3.5 - 5.1 mmol/L 4.3 4.5 3.7  Chloride 98 - 111 mmol/L 103 103  102  CO2 22 - 32 mmol/L _0 Calcium 8.9 - 10.3 mg/dL 9.0 8.7(L) 8.7(L)  Total Protein 6.5 - 8.1 g/dL 7.0 6.5 6.8  Total Bilirubin 0.3 - 1.2 mg/dL 0.5 0.3 0.4  Alkaline Phos 38 - 126 U/L 110 92 93  AST 15 - 41 U/L _1 ALT 0 - 44 U/L _2 DIAGNOSTIC IMAGING:  I have independently reviewed the scans and discussed with the patient.  ASSESSMENT & PLAN:  HER2-positive carcinoma of left breast ([place]) 1.  Metastatic HER-2 positive left breast cancer to the lungs: -PET scan on 11/22/2019 showed progressive metastatic disease in the lungs with progression of the left breast mass.  No new metastatic sites.  MRI of the brain was negative. -2  cycles of docetaxel and Herceptin on 11/27/2019 and 12/18/2019 with docetaxel at 60 mg per metered square. -She is continuing to tolerate chemotherapy very well.  Denies any side effects.  I have formed and talked to her Lawson Fiscal while patient is in the room. -I have reviewed her labs.  I will proceed with cycle 3 today.  I will increase docetaxel to 75 mg per metered square. -I plan to repeat scans after today's treatment prior to next visit in 3 weeks.  2.  High risk drug monitoring: -2D echo on 10/10/2019 shows EF 60-65%. -Follow-up monitor ejection fraction periodically.    Orders placed this encounter:  Orders Placed This Encounter  Procedures  . NM PET Image Restag (PS) Skull Base To Thigh  . CBC with Differential  . Comprehensive metabolic panel  . Magnesium  . Cancer antigen 15-3  . Cancer antigen 27.29      Derek Jack, MD, 01/08/20 7:43 PM  [place] [phone]   I, Jacqualyn Posey, am acting as a scribe for Dr. Sanda Linger.  I, Derek Jack MD, have reviewed the above documentation for accuracy and completeness, and I agree with the above.

## 2020-01-08 ENCOUNTER — Inpatient Hospital Stay (HOSPITAL_COMMUNITY): Payer: Medicare Other

## 2020-01-08 ENCOUNTER — Inpatient Hospital Stay (HOSPITAL_BASED_OUTPATIENT_CLINIC_OR_DEPARTMENT_OTHER): Payer: Medicare Other | Admitting: Hematology

## 2020-01-08 ENCOUNTER — Inpatient Hospital Stay (HOSPITAL_COMMUNITY): Payer: Medicare Other | Attending: Hematology

## 2020-01-08 ENCOUNTER — Other Ambulatory Visit: Payer: Self-pay

## 2020-01-08 VITALS — BP 93/39 | HR 74 | Temp 97.1°F | Resp 18 | Wt 134.5 lb

## 2020-01-08 VITALS — BP 90/43 | HR 70 | Temp 97.1°F | Resp 18

## 2020-01-08 DIAGNOSIS — C50812 Malignant neoplasm of overlapping sites of left female breast: Secondary | ICD-10-CM | POA: Insufficient documentation

## 2020-01-08 DIAGNOSIS — C50912 Malignant neoplasm of unspecified site of left female breast: Secondary | ICD-10-CM

## 2020-01-08 DIAGNOSIS — C7800 Secondary malignant neoplasm of unspecified lung: Secondary | ICD-10-CM | POA: Insufficient documentation

## 2020-01-08 DIAGNOSIS — A419 Sepsis, unspecified organism: Secondary | ICD-10-CM | POA: Diagnosis not present

## 2020-01-08 DIAGNOSIS — R000 Tachycardia, unspecified: Secondary | ICD-10-CM | POA: Diagnosis not present

## 2020-01-08 DIAGNOSIS — C7801 Secondary malignant neoplasm of right lung: Secondary | ICD-10-CM | POA: Diagnosis not present

## 2020-01-08 DIAGNOSIS — Z5112 Encounter for antineoplastic immunotherapy: Secondary | ICD-10-CM | POA: Insufficient documentation

## 2020-01-08 DIAGNOSIS — R509 Fever, unspecified: Secondary | ICD-10-CM | POA: Diagnosis not present

## 2020-01-08 DIAGNOSIS — Z20822 Contact with and (suspected) exposure to covid-19: Secondary | ICD-10-CM | POA: Diagnosis not present

## 2020-01-08 DIAGNOSIS — Z515 Encounter for palliative care: Secondary | ICD-10-CM | POA: Diagnosis not present

## 2020-01-08 DIAGNOSIS — R6521 Severe sepsis with septic shock: Secondary | ICD-10-CM | POA: Diagnosis not present

## 2020-01-08 DIAGNOSIS — Z66 Do not resuscitate: Secondary | ICD-10-CM | POA: Diagnosis not present

## 2020-01-08 DIAGNOSIS — Z171 Estrogen receptor negative status [ER-]: Secondary | ICD-10-CM

## 2020-01-08 DIAGNOSIS — N61 Mastitis without abscess: Secondary | ICD-10-CM | POA: Diagnosis not present

## 2020-01-08 DIAGNOSIS — N63 Unspecified lump in unspecified breast: Secondary | ICD-10-CM | POA: Diagnosis not present

## 2020-01-08 LAB — COMPREHENSIVE METABOLIC PANEL
ALT: 13 U/L (ref 0–44)
AST: 23 U/L (ref 15–41)
Albumin: 3.3 g/dL — ABNORMAL LOW (ref 3.5–5.0)
Alkaline Phosphatase: 110 U/L (ref 38–126)
Anion gap: 10 (ref 5–15)
BUN: 19 mg/dL (ref 8–23)
CO2: 27 mmol/L (ref 22–32)
Calcium: 9 mg/dL (ref 8.9–10.3)
Chloride: 103 mmol/L (ref 98–111)
Creatinine, Ser: 0.5 mg/dL (ref 0.44–1.00)
GFR calc Af Amer: >60 mL/min (ref >60)
GFR calc non Af Amer: >60 mL/min (ref >60)
Glucose, Bld: 98 mg/dL (ref 70–99)
Potassium: 4.3 mmol/L (ref 3.5–5.1)
Sodium: 140 mmol/L (ref 135–145)
Total Bilirubin: 0.5 mg/dL (ref 0.3–1.2)
Total Protein: 7 g/dL (ref 6.5–8.1)

## 2020-01-08 LAB — CBC WITH DIFFERENTIAL/PLATELET
Abs Immature Granulocytes: 0.03 10*3/uL (ref 0.00–0.07)
Basophils Absolute: 0.1 10*3/uL (ref 0.0–0.1)
Basophils Relative: 1 %
Eosinophils Absolute: 0.1 10*3/uL (ref 0.0–0.5)
Eosinophils Relative: 1 %
HCT: 37.7 % (ref 36.0–46.0)
Hemoglobin: 11.8 g/dL — ABNORMAL LOW (ref 12.0–15.0)
Immature Granulocytes: 0 %
Lymphocytes Relative: 13 %
Lymphs Abs: 1.5 10*3/uL (ref 0.7–4.0)
MCH: 30.6 pg (ref 26.0–34.0)
MCHC: 31.3 g/dL (ref 30.0–36.0)
MCV: 97.7 fL (ref 80.0–100.0)
Monocytes Absolute: 0.8 10*3/uL (ref 0.1–1.0)
Monocytes Relative: 7 %
Neutro Abs: 8.7 10*3/uL — ABNORMAL HIGH (ref 1.7–7.7)
Neutrophils Relative %: 78 %
Platelets: 209 10*3/uL (ref 150–400)
RBC: 3.86 MIL/uL — ABNORMAL LOW (ref 3.87–5.11)
RDW: 15.8 % — ABNORMAL HIGH (ref 11.5–15.5)
WBC: 11.1 10*3/uL — ABNORMAL HIGH (ref 4.0–10.5)
nRBC: 0 % (ref 0.0–0.2)

## 2020-01-08 LAB — MAGNESIUM: Magnesium: 2.3 mg/dL (ref 1.7–2.4)

## 2020-01-08 LAB — LACTATE DEHYDROGENASE: LDH: 126 U/L (ref 98–192)

## 2020-01-08 MED ORDER — DIPHENHYDRAMINE HCL 25 MG PO CAPS
50.0000 mg | ORAL_CAPSULE | Freq: Once | ORAL | Status: AC
  Administered 2020-01-08: 50 mg via ORAL
  Filled 2020-01-08: qty 2

## 2020-01-08 MED ORDER — SODIUM CHLORIDE 0.9 % IV SOLN: Freq: Once | INTRAVENOUS | Status: AC

## 2020-01-08 MED ORDER — ACETAMINOPHEN 325 MG PO TABS
650.0000 mg | ORAL_TABLET | Freq: Once | ORAL | Status: AC
  Administered 2020-01-08: 650 mg via ORAL
  Filled 2020-01-08: qty 2

## 2020-01-08 MED ORDER — PALONOSETRON HCL INJECTION 0.25 MG/5ML
0.2500 mg | Freq: Once | INTRAVENOUS | Status: AC
  Administered 2020-01-08: 0.25 mg via INTRAVENOUS
  Filled 2020-01-08: qty 5

## 2020-01-08 MED ORDER — SODIUM CHLORIDE 0.9 % IV SOLN
75.0000 mg/m2 | Freq: Once | INTRAVENOUS | Status: AC
  Administered 2020-01-08: 120 mg via INTRAVENOUS
  Filled 2020-01-08: qty 12

## 2020-01-08 MED ORDER — TRASTUZUMAB-DKST CHEMO 150 MG IV SOLR
6.0000 mg/kg | Freq: Once | INTRAVENOUS | Status: AC
  Administered 2020-01-08: 357 mg via INTRAVENOUS
  Filled 2020-01-08: qty 17

## 2020-01-08 MED ORDER — SODIUM CHLORIDE 0.9 % IV SOLN
10.0000 mg | Freq: Once | INTRAVENOUS | Status: AC
  Administered 2020-01-08: 10 mg via INTRAVENOUS
  Filled 2020-01-08: qty 10

## 2020-01-08 MED ORDER — SODIUM CHLORIDE 0.9% FLUSH
10.0000 mL | INTRAVENOUS | Status: DC | PRN
  Administered 2020-01-08: 10 mL

## 2020-01-08 MED ORDER — HEPARIN SOD (PORK) LOCK FLUSH 100 UNIT/ML IV SOLN
500.0000 [IU] | Freq: Once | INTRAVENOUS | Status: AC | PRN
  Administered 2020-01-08: 500 [IU]

## 2020-01-08 NOTE — Progress Notes (Signed)
Patient has been assessed, vital signs and labs have been reviewed by Dr. Delton Coombes. ANC, Creatinine, LFTs, and Platelets are within treatment parameters per Dr. Delton Coombes. The patient is good to proceed with treatment at this time. Docetaxel increased to regular dose today per Dr. Delton Coombes.

## 2020-01-08 NOTE — Patient Instructions (Addendum)
[place] at [facility] Discharge Instructions  You were seen today by Dr. Delton Coombes. He went over your recent results. He will see you back in 3 weeks for labs and follow up. We will schedule you for a PET scan. You received your treatment today.    Thank you for choosing [place] at [facility] to provide your oncology and hematology care.  To afford each patient quality time with our provider, please arrive at least 15 minutes before your scheduled appointment time.   If you have a lab appointment with the [facility] please come in thru the  Main Entrance and check in at the main information desk  You need to re-schedule your appointment should you arrive 10 or more minutes late.  We strive to give you quality time with our providers, and arriving late affects you and other patients whose appointments are after yours.  Also, if you no show three or more times for appointments you may be dismissed from the clinic at the providers discretion.     Again, thank you for choosing [facility].  Our hope is that these requests will decrease the amount of time that you wait before being seen by our physicians.       _____________________________________________________________  Should you have questions after your visit to [facility], please contact our office at [phone] between the hours of 8:00 a.m. and 4:30 p.m.  Voicemails left after 4:00 p.m. will not be returned until the following business day.  For prescription refill requests, have your pharmacy contact our office and allow 72 hours.    Cancer Center Support Programs:   > Cancer Support Group  2nd Tuesday of the month 1pm-2pm, Journey Room

## 2020-01-08 NOTE — Patient Instructions (Signed)
[facility] at [facility] Discharge Instructions  Labs drawn from portacath today   Thank you for choosing [facility] at [facility] to provide your oncology and hematology care.  To afford each patient quality time with our provider, please arrive at least 15 minutes before your scheduled appointment time.   If you have a lab appointment with the Cancer Center please come in thru the Main Entrance and check in at the main information desk.  You need to re-schedule your appointment should you arrive 10 or more minutes late.  We strive to give you quality time with our providers, and arriving late affects you and other patients whose appointments are after yours.  Also, if you no show three or more times for appointments you may be dismissed from the clinic at the providers discretion.     Again, thank you for choosing [facility].  Our hope is that these requests will decrease the amount of time that you wait before being seen by our physicians.       _____________________________________________________________  Should you have questions after your visit to [facility], please contact our office at [phone] between the hours of 8:00 a.m. and 4:30 p.m.  Voicemails left after 4:00 p.m. will not be returned until the following business day.  For prescription refill requests, have your pharmacy contact our office and allow 72 hours.    Due to Covid, you will need to wear a mask upon entering the hospital. If you do not have a mask, a mask will be given to you at the Main Entrance upon arrival. For doctor visits, patients may have 1 support person with them. For treatment visits, patients can not have anyone with them due to social distancing guidelines and our immunocompromised population.     

## 2020-01-08 NOTE — Progress Notes (Signed)
Patient tolerated chemotherapy with no complaints voiced.  Side effects with management reviewed with understanding verbalized by the caregiver.  Port site clean and dry with no bruising or swelling noted at site.  Good blood return noted before and after administration of chemotherapy.  Band aid applied.  Patient left ambulatory with caregiver with VSS and no s/s of distress noted.

## 2020-01-08 NOTE — Assessment & Plan Note (Signed)
1.  Metastatic HER-2 positive left breast cancer to the lungs: -PET scan on 11/22/2019 showed progressive metastatic disease in the lungs with progression of the left breast mass.  No new metastatic sites.  MRI of the brain was negative. -2  cycles of docetaxel and Herceptin on 11/27/2019 and 12/18/2019 with docetaxel at 60 mg per metered square. -She is continuing to tolerate chemotherapy very well.  Denies any side effects.  I have formed and talked to her Lawson Fiscal while patient is in the room. -I have reviewed her labs.  I will proceed with cycle 3 today.  I will increase docetaxel to 75 mg per metered square. -I plan to repeat scans after today's treatment prior to next visit in 3 weeks.  2.  High risk drug monitoring: -2D echo on 10/10/2019 shows EF 60-65%. -Follow-up monitor ejection fraction periodically.

## 2020-01-09 DIAGNOSIS — F339 Major depressive disorder, recurrent, unspecified: Secondary | ICD-10-CM | POA: Diagnosis not present

## 2020-01-09 DIAGNOSIS — C7800 Secondary malignant neoplasm of unspecified lung: Secondary | ICD-10-CM | POA: Diagnosis not present

## 2020-01-09 DIAGNOSIS — E559 Vitamin D deficiency, unspecified: Secondary | ICD-10-CM | POA: Diagnosis not present

## 2020-01-09 DIAGNOSIS — G309 Alzheimer's disease, unspecified: Secondary | ICD-10-CM | POA: Diagnosis not present

## 2020-01-09 LAB — CANCER ANTIGEN 27.29: CA 27.29: 26.6 U/mL (ref 0.0–38.6)

## 2020-01-09 LAB — CANCER ANTIGEN 15-3: CA 15-3: 26.4 U/mL — ABNORMAL HIGH (ref 0.0–25.0)

## 2020-01-10 ENCOUNTER — Other Ambulatory Visit: Payer: Self-pay

## 2020-01-10 ENCOUNTER — Encounter (HOSPITAL_COMMUNITY): Payer: Self-pay | Admitting: *Deleted

## 2020-01-10 ENCOUNTER — Inpatient Hospital Stay (HOSPITAL_COMMUNITY)
Admission: EM | Admit: 2020-01-10 | Discharge: 2020-01-15 | DRG: 871 | Disposition: A | Discharge status: Hospice | Payer: Medicare Other | Attending: Family Medicine | Admitting: Family Medicine

## 2020-01-10 ENCOUNTER — Emergency Department (HOSPITAL_COMMUNITY): Payer: Medicare Other

## 2020-01-10 ENCOUNTER — Encounter (HOSPITAL_COMMUNITY): Payer: Self-pay

## 2020-01-10 ENCOUNTER — Inpatient Hospital Stay (HOSPITAL_COMMUNITY): Payer: Medicare Other

## 2020-01-10 DIAGNOSIS — C7801 Secondary malignant neoplasm of right lung: Secondary | ICD-10-CM | POA: Diagnosis present

## 2020-01-10 DIAGNOSIS — R000 Tachycardia, unspecified: Secondary | ICD-10-CM | POA: Diagnosis not present

## 2020-01-10 DIAGNOSIS — Z7189 Other specified counseling: Secondary | ICD-10-CM

## 2020-01-10 DIAGNOSIS — F0280 Dementia in other diseases classified elsewhere without behavioral disturbance: Secondary | ICD-10-CM | POA: Diagnosis present

## 2020-01-10 DIAGNOSIS — Z515 Encounter for palliative care: Secondary | ICD-10-CM

## 2020-01-10 DIAGNOSIS — N63 Unspecified lump in unspecified breast: Secondary | ICD-10-CM | POA: Diagnosis not present

## 2020-01-10 DIAGNOSIS — C50912 Malignant neoplasm of unspecified site of left female breast: Secondary | ICD-10-CM | POA: Diagnosis not present

## 2020-01-10 DIAGNOSIS — R64 Cachexia: Secondary | ICD-10-CM | POA: Diagnosis present

## 2020-01-10 DIAGNOSIS — Z1501 Genetic susceptibility to malignant neoplasm of breast: Secondary | ICD-10-CM | POA: Diagnosis not present

## 2020-01-10 DIAGNOSIS — R6521 Severe sepsis with septic shock: Secondary | ICD-10-CM | POA: Diagnosis not present

## 2020-01-10 DIAGNOSIS — F329 Major depressive disorder, single episode, unspecified: Secondary | ICD-10-CM | POA: Diagnosis present

## 2020-01-10 DIAGNOSIS — M6281 Muscle weakness (generalized): Secondary | ICD-10-CM | POA: Diagnosis not present

## 2020-01-10 DIAGNOSIS — R54 Age-related physical debility: Secondary | ICD-10-CM | POA: Diagnosis present

## 2020-01-10 DIAGNOSIS — Z66 Do not resuscitate: Secondary | ICD-10-CM | POA: Diagnosis present

## 2020-01-10 DIAGNOSIS — E639 Nutritional deficiency, unspecified: Secondary | ICD-10-CM | POA: Diagnosis not present

## 2020-01-10 DIAGNOSIS — Z79899 Other long term (current) drug therapy: Secondary | ICD-10-CM | POA: Diagnosis not present

## 2020-01-10 DIAGNOSIS — E559 Vitamin D deficiency, unspecified: Secondary | ICD-10-CM | POA: Diagnosis not present

## 2020-01-10 DIAGNOSIS — N61 Mastitis without abscess: Secondary | ICD-10-CM | POA: Diagnosis not present

## 2020-01-10 DIAGNOSIS — G309 Alzheimer's disease, unspecified: Secondary | ICD-10-CM | POA: Diagnosis present

## 2020-01-10 DIAGNOSIS — C7800 Secondary malignant neoplasm of unspecified lung: Secondary | ICD-10-CM | POA: Diagnosis not present

## 2020-01-10 DIAGNOSIS — C50812 Malignant neoplasm of overlapping sites of left female breast: Secondary | ICD-10-CM | POA: Diagnosis present

## 2020-01-10 DIAGNOSIS — N6320 Unspecified lump in the left breast, unspecified quadrant: Secondary | ICD-10-CM | POA: Diagnosis not present

## 2020-01-10 DIAGNOSIS — N6321 Unspecified lump in the left breast, upper outer quadrant: Secondary | ICD-10-CM | POA: Diagnosis not present

## 2020-01-10 DIAGNOSIS — R0689 Other abnormalities of breathing: Secondary | ICD-10-CM

## 2020-01-10 DIAGNOSIS — N6322 Unspecified lump in the left breast, upper inner quadrant: Secondary | ICD-10-CM | POA: Diagnosis not present

## 2020-01-10 DIAGNOSIS — A419 Sepsis, unspecified organism: Secondary | ICD-10-CM | POA: Diagnosis not present

## 2020-01-10 DIAGNOSIS — D6481 Anemia due to antineoplastic chemotherapy: Secondary | ICD-10-CM | POA: Diagnosis present

## 2020-01-10 DIAGNOSIS — D630 Anemia in neoplastic disease: Secondary | ICD-10-CM | POA: Diagnosis present

## 2020-01-10 DIAGNOSIS — R509 Fever, unspecified: Secondary | ICD-10-CM | POA: Diagnosis not present

## 2020-01-10 DIAGNOSIS — Z741 Need for assistance with personal care: Secondary | ICD-10-CM | POA: Diagnosis not present

## 2020-01-10 DIAGNOSIS — T451X5A Adverse effect of antineoplastic and immunosuppressive drugs, initial encounter: Secondary | ICD-10-CM | POA: Diagnosis present

## 2020-01-10 DIAGNOSIS — E872 Acidosis: Secondary | ICD-10-CM | POA: Diagnosis present

## 2020-01-10 DIAGNOSIS — Z20822 Contact with and (suspected) exposure to covid-19: Secondary | ICD-10-CM | POA: Diagnosis present

## 2020-01-10 DIAGNOSIS — C50919 Malignant neoplasm of unspecified site of unspecified female breast: Secondary | ICD-10-CM | POA: Diagnosis not present

## 2020-01-10 DIAGNOSIS — R2689 Other abnormalities of gait and mobility: Secondary | ICD-10-CM | POA: Diagnosis not present

## 2020-01-10 LAB — CBC WITH DIFFERENTIAL/PLATELET
Abs Immature Granulocytes: 0.15 10*3/uL — ABNORMAL HIGH (ref 0.00–0.07)
Basophils Absolute: 0.1 10*3/uL (ref 0.0–0.1)
Basophils Relative: 0 %
Eosinophils Absolute: 0.3 10*3/uL (ref 0.0–0.5)
Eosinophils Relative: 2 %
HCT: 38.5 % (ref 36.0–46.0)
Hemoglobin: 12.2 g/dL (ref 12.0–15.0)
Immature Granulocytes: 1 %
Lymphocytes Relative: 3 %
Lymphs Abs: 0.7 10*3/uL (ref 0.7–4.0)
MCH: 30.5 pg (ref 26.0–34.0)
MCHC: 31.7 g/dL (ref 30.0–36.0)
MCV: 96.3 fL (ref 80.0–100.0)
Monocytes Absolute: 0.3 10*3/uL (ref 0.1–1.0)
Monocytes Relative: 1 %
Neutro Abs: 19.1 10*3/uL — ABNORMAL HIGH (ref 1.7–7.7)
Neutrophils Relative %: 93 %
Platelets: 170 10*3/uL (ref 150–400)
RBC: 4 MIL/uL (ref 3.87–5.11)
RDW: 15.6 % — ABNORMAL HIGH (ref 11.5–15.5)
WBC: 20.5 10*3/uL — ABNORMAL HIGH (ref 4.0–10.5)
nRBC: 0 % (ref 0.0–0.2)

## 2020-01-10 LAB — URINALYSIS, ROUTINE W REFLEX MICROSCOPIC
Bacteria, UA: NONE SEEN
Bilirubin Urine: NEGATIVE
Glucose, UA: NEGATIVE mg/dL
Ketones, ur: NEGATIVE mg/dL
Leukocytes,Ua: NEGATIVE
Nitrite: NEGATIVE
Protein, ur: NEGATIVE mg/dL
Specific Gravity, Urine: 1.005 (ref 1.005–1.030)
pH: 5 (ref 5.0–8.0)

## 2020-01-10 LAB — LACTIC ACID, PLASMA
Lactic Acid, Venous: 2.5 mmol/L (ref 0.5–1.9)
Lactic Acid, Venous: 3.2 mmol/L (ref 0.5–1.9)
Lactic Acid, Venous: 1.1 mmol/L (ref 0.5–1.9)

## 2020-01-10 LAB — COMPREHENSIVE METABOLIC PANEL
ALT: 13 U/L (ref 0–44)
AST: 23 U/L (ref 15–41)
Albumin: 3.4 g/dL — ABNORMAL LOW (ref 3.5–5.0)
Alkaline Phosphatase: 86 U/L (ref 38–126)
Anion gap: 9 (ref 5–15)
BUN: 19 mg/dL (ref 8–23)
CO2: 24 mmol/L (ref 22–32)
Calcium: 8.4 mg/dL — ABNORMAL LOW (ref 8.9–10.3)
Chloride: 102 mmol/L (ref 98–111)
Creatinine, Ser: 0.63 mg/dL (ref 0.44–1.00)
GFR calc Af Amer: >60 mL/min (ref >60)
GFR calc non Af Amer: >60 mL/min (ref >60)
Glucose, Bld: 139 mg/dL — ABNORMAL HIGH (ref 70–99)
Potassium: 3.7 mmol/L (ref 3.5–5.1)
Sodium: 135 mmol/L (ref 135–145)
Total Bilirubin: 0.8 mg/dL (ref 0.3–1.2)
Total Protein: 7.2 g/dL (ref 6.5–8.1)

## 2020-01-10 LAB — SARS CORONAVIRUS 2 BY RT PCR (HOSPITAL ORDER, PERFORMED IN ~~LOC~~ HOSPITAL LAB): SARS Coronavirus 2: NEGATIVE

## 2020-01-10 LAB — PROTIME-INR
INR: 1.2 (ref 0.8–1.2)
Prothrombin Time: 14.4 seconds (ref 11.4–15.2)

## 2020-01-10 LAB — APTT: aPTT: 34 seconds (ref 24–36)

## 2020-01-10 LAB — MRSA PCR SCREENING: MRSA by PCR: NEGATIVE

## 2020-01-10 MED ORDER — SODIUM CHLORIDE 0.9 % IV BOLUS (SEPSIS)
1000.0000 mL | Freq: Once | INTRAVENOUS | Status: AC
  Administered 2020-01-10: 1000 mL via INTRAVENOUS

## 2020-01-10 MED ORDER — ACETAMINOPHEN 325 MG PO TABS
650.0000 mg | ORAL_TABLET | Freq: Four times a day (QID) | ORAL | Status: DC | PRN
  Administered 2020-01-11 – 2020-01-12 (×3): 650 mg via ORAL
  Filled 2020-01-10 (×3): qty 2

## 2020-01-10 MED ORDER — NOREPINEPHRINE 4 MG/250ML-% IV SOLN
0.0000 ug/min | INTRAVENOUS | Status: DC
  Administered 2020-01-10: 2 ug/min via INTRAVENOUS
  Administered 2020-01-11: 3 ug/min via INTRAVENOUS
  Filled 2020-01-10 (×2): qty 250

## 2020-01-10 MED ORDER — ONDANSETRON HCL 4 MG/2ML IJ SOLN: 4.0000 mg | Freq: Four times a day (QID) | INTRAMUSCULAR | Status: DC | PRN

## 2020-01-10 MED ORDER — SODIUM CHLORIDE 0.9 % IV SOLN
2.0000 g | Freq: Two times a day (BID) | INTRAVENOUS | Status: DC
  Administered 2020-01-11 – 2020-01-14 (×7): 2 g via INTRAVENOUS
  Filled 2020-01-10 (×7): qty 2

## 2020-01-10 MED ORDER — VANCOMYCIN HCL 500 MG/100ML IV SOLN
500.0000 mg | Freq: Two times a day (BID) | INTRAVENOUS | Status: DC
  Administered 2020-01-11: 500 mg via INTRAVENOUS
  Filled 2020-01-10 (×8): qty 100

## 2020-01-10 MED ORDER — METRONIDAZOLE IN NACL 5-0.79 MG/ML-% IV SOLN
500.0000 mg | Freq: Three times a day (TID) | INTRAVENOUS | Status: AC
  Administered 2020-01-10 – 2020-01-13 (×10): 500 mg via INTRAVENOUS
  Filled 2020-01-10 (×10): qty 100

## 2020-01-10 MED ORDER — SODIUM CHLORIDE 0.9 % IV SOLN
2.0000 g | Freq: Once | INTRAVENOUS | Status: AC
  Administered 2020-01-10: 2 g via INTRAVENOUS
  Filled 2020-01-10: qty 2

## 2020-01-10 MED ORDER — SODIUM CHLORIDE 0.9 % IV BOLUS
1000.0000 mL | Freq: Once | INTRAVENOUS | Status: AC
  Administered 2020-01-10: 1000 mL via INTRAVENOUS

## 2020-01-10 MED ORDER — ACETAMINOPHEN 500 MG PO TABS
1000.0000 mg | ORAL_TABLET | Freq: Once | ORAL | Status: AC
  Administered 2020-01-10: 1000 mg via ORAL
  Filled 2020-01-10: qty 2

## 2020-01-10 MED ORDER — ONDANSETRON HCL 4 MG PO TABS: 4.0000 mg | ORAL_TABLET | Freq: Four times a day (QID) | ORAL | Status: DC | PRN

## 2020-01-10 MED ORDER — SODIUM CHLORIDE 0.9 % IV SOLN: INTRAVENOUS | Status: AC

## 2020-01-10 MED ORDER — POLYETHYLENE GLYCOL 3350 17 G PO PACK: 17.0000 g | PACK | Freq: Every day | ORAL | Status: DC | PRN

## 2020-01-10 MED ORDER — ACETAMINOPHEN 650 MG RE SUPP: 650.0000 mg | Freq: Four times a day (QID) | RECTAL | Status: DC | PRN

## 2020-01-10 MED ORDER — VANCOMYCIN HCL IN DEXTROSE 1-5 GM/200ML-% IV SOLN
1000.0000 mg | Freq: Once | INTRAVENOUS | Status: AC
  Administered 2020-01-10: 1000 mg via INTRAVENOUS
  Filled 2020-01-10: qty 200

## 2020-01-10 NOTE — Progress Notes (Signed)
Pharmacy Antibiotic Note  [name] [name] is a 64 y.o. female admitted on 01/10/2020 with sepsis and cellulitis.  Pharmacy has been consulted for cefepime and vancomycin dosing.  Plan: Vancomycin 500mg  IV every 12 hours.  Goal trough 15-20 mcg/mL. cefepime 2gm iv q12  Height: 5\' 2"  (157.5 cm) Weight: 61 kg (134 lb 7.7 oz) IBW/kg (Calculated) : 50.1  Temp (24hrs), Avg:101.1 F (38.4 C), Min:101.1 F (38.4 C), Max:101.1 F (38.4 C)  Recent Labs  Lab 01/08/20 0755 01/10/20 1616  WBC 11.1* 20.5*  CREATININE 0.50 0.63    Estimated Creatinine Clearance: 59.5 mL/min (by C-G formula based on SCr of 0.63 mg/dL).    No Known Allergies  Antimicrobials this admission: 5/20 metronidazole >>  5/20 cefepime >>  5/20 vancomycin >>  Microbiology results: 5/20 BCx: sent    Thank you for allowing pharmacy to be a part of this patient's care.  [name] [name] 01/10/2020 5:10 PM

## 2020-01-10 NOTE — ED Triage Notes (Signed)
Pt to get an injection, recent chemo this past Tuesday. Pt with Alzheimer's disease.  Pt denies any pain.

## 2020-01-10 NOTE — H&P (Signed)
History and Physical    [name] [name] [id number] DOB: 09/06/1955 DOA: 01/10/2020  PCP: Bonnita Nasuti, MD   Patient coming from: Home  I have personally briefly reviewed patient's old medical records in [place]  Chief Complaint: Fever  HPI: [name] [name] is a 64 y.o. female with medical history significant for left breast cancer with lung metastasis, dementia, depression.  Patient presented to the ED with reports of fever 101.  She denies cough or difficulty breathing, she denies pain with urination.  No vomiting no loose stools.  She reports redness and swelling to her left breast.  ED Course: T-max 103.1, tachycardic to 132, blood pressure systolic down to 0000000, 3 L sepsis fluid bolus given with persistent hypotension.  WBC 20.5.  Increasing lactic acidosis 2.5 >> 3.2.  Portable chest x-ray shows hazy opacity at the lateral right lung base which may indicate atelectasis or infection.  Blood cultures obtained, patient was started on broad-spectrum antibiotics IV Vanco cefepime and metronidazole.  Hospitalist admit for sepsis from left breast mass with cellulitis.  Review of Systems: As per HPI all other systems reviewed and negative.  Past Medical History:  Diagnosis Date  . Alzheimer's dementia ([place])   . Bradycardia   . Breast cancer ([place])   . Dementia ([place])   . Depression   . Hypokalemia   . Hypotension   . 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 ABDOMINAL HYSTERECTOMY    . BREAST BIOPSY Left   . PORTACATH PLACEMENT Right 05/15/2018   Procedure: INSERTION PORT-A-CATH;  Surgeon: Aviva Signs, MD;  Location: [facility];  Service: General;  Laterality: Right;     has an unknown smoking status. She has never used smokeless tobacco. She reports previous alcohol use. She reports that she does not use drugs.  No Known Allergies  Family history of hypertension.  Prior to Admission  medications   Medication Sig Start Date End Date Taking? Authorizing Provider  acetaminophen (TYLENOL) 325 MG tablet Take 650 mg by mouth every 6 (six) hours as needed for mild pain.    [provider]  ALPRAZolam Duanne Moron) 0.5 MG tablet Take 0.5 mg by mouth 3 (three) times daily as needed.  01/12/19   [provider]  betamethasone dipropionate (DIPROLENE) 0.05 % cream 2 (two) times daily. Apply to left breast two times a day for dermatitis use on days that resident doesn't go for chemotherapy. 03/19/19   [provider]  busPIRone (BUSPAR) 5 MG tablet Take 5 mg by mouth 2 (two) times daily.    [provider]  divalproex (DEPAKOTE) 250 MG DR tablet Take 250 mg by mouth at bedtime.  12/02/18   [provider]  divalproex (DEPAKOTE) 500 MG DR tablet Take 500 mg by mouth every morning.  02/09/19   [provider]  docusate sodium (COLACE) 100 MG capsule Take 200 mg by mouth daily. May increase to 2 capsules in the am and 2 capsules in the pm. If no BM, call the [facility].    [provider]  donepezil (ARICEPT) 10 MG tablet Take 10 mg by mouth at bedtime.  12/07/18   [provider]  HYDROcodone-acetaminophen (NORCO/VICODIN) 5-325 MG tablet Take 1 tablet by mouth every 6 (six) hours as needed.  03/19/19   [provider]  lidocaine-prilocaine (EMLA) cream Apply 1 application topically as needed (apply to port site topically every 21 days).  [provider]  memantine (NAMENDA) 10 MG tablet Take 10 mg by mouth 2 (two) times daily.    [provider]  mirtazapine (REMERON) 7.5 MG tablet Take 7.5 mg by mouth at bedtime.    [provider]  ondansetron (ZOFRAN) 4 MG tablet Take 1 tablet (4 mg total) by mouth every 8 (eight) hours as needed for nausea or vomiting. Patient not taking: Reported on 01/08/2020 06/12/19   Francene Finders L, NP-C  QUEtiapine (SEROQUEL) 25 MG tablet Take 12.5 mg by mouth 3  (three) times daily.     [provider]  sertraline (ZOLOFT) 100 MG tablet Take 100 mg by mouth daily.     [provider]  VITAMIN D, CHOLECALCIFEROL, PO Take 2,000 Int'l Units by mouth daily.    [provider]  prochlorperazine (COMPAZINE) 10 MG tablet Take 1 tablet (10 mg total) by mouth every 6 (six) hours as needed (Nausea or vomiting). 05/30/18 01/18/19  Derek Jack, MD    Physical Exam: Vitals:   01/10/20 2002 01/10/20 2004 01/10/20 2004 01/10/20 2015  BP: (!) 89/47 (!) 91/53  (!) 104/49  Pulse:      Resp: 15 17  14   Temp:   99.8 F (37.7 C)   TempSrc:   Oral   SpO2: 94% 99%  95%  Weight:      Height:        Constitutional: calm, comfortable Vitals:   01/10/20 2002 01/10/20 2004 01/10/20 2004 01/10/20 2015  BP: (!) 89/47 (!) 91/53  (!) 104/49  Pulse:      Resp: 15 17  14   Temp:   99.8 F (37.7 C)   TempSrc:   Oral   SpO2: 94% 99%  95%  Weight:      Height:       Eyes: PERRL, lids and conjunctivae normal ENMT: Mucous membranes are dry Neck: normal, supple, no masses, no thyromegaly Respiratory: clear to auscultation bilaterally, no wheezing, no crackles. Normal respiratory effort. No accessory muscle use.  Cardiovascular: Tachycardic, regular rate and rhythm, 3/6 systolic murmurs, no  rubs / gallops. No extremity edema. 2+ pedal pulses.  Port right upper chest.   Left breast tender, erythematous, warm, with a protuding mass on surface extending about 6 cm, but on palpation beneath surface of skin, mass is larger and extends ~13cm, with surrounding induration, no open lesions or ulcers on breast. Abdomen: no tenderness, no masses palpated. No hepatosplenomegaly. Bowel sounds positive.  Musculoskeletal: no clubbing / cyanosis. No joint deformity upper and lower extremities. Good ROM, no contractures. Normal muscle tone.  Skin: no rashes, lesions, ulcers. No induration Neurologic: No apparent cranial nerve abnormality, moving all  extremities spontaneously. Psychiatric: Slightly lethargic, appears to be asleep but arouses to voice, answers simple question with yes no answers appropriately but not giving detailed history.         Labs on Admission: I have personally reviewed following labs and imaging studies  CBC: Recent Labs  Lab 01/08/20 0755 01/10/20 1616  WBC 11.1* 20.5*  NEUTROABS 8.7* 19.1*  HGB 11.8* 12.2  HCT 37.7 38.5  MCV 97.7 96.3  PLT 209 [id number]   Basic Metabolic Panel: Recent Labs  Lab 01/08/20 0755 01/10/20 1616  NA 140 135  K 4.3 3.7  CL 103 102  CO2 27 24  GLUCOSE 98 139*  BUN 19 19  CREATININE 0.50 0.63  CALCIUM 9.0 8.4*  MG 2.3  --    Liver Function Tests: Recent Labs  Lab  01/08/20 0755 01/10/20 1616  AST 23 23  ALT 13 13  ALKPHOS 110 86  BILITOT 0.5 0.8  PROT 7.0 7.2  ALBUMIN 3.3* 3.4*   Coagulation Profile: Recent Labs  Lab 01/10/20 1616  INR 1.2    Radiological Exams on Admission: DG Chest Portable 1 View  Result Date: 01/10/2020 CLINICAL DATA:  Fever EXAM: PORTABLE CHEST 1 VIEW COMPARISON:  05/23/2018 FINDINGS: Right chest wall Port-A-Cath with tip at the lower SVC. Hazy opacity at the lateral right lung base. Normal cardiomediastinal contours. No pleural effusion or pneumothorax. Shallow lung inflation. IMPRESSION: Hazy opacity at the lateral right lung base may indicate atelectasis or infection. Electronically Signed   By: Ulyses Jarred M.D.   On: 01/10/2020 17:59    EKG: Independently reviewed.  Sinus tachycardia rate 121, QTc 436.  No significant abnormalities compared to prior.  Assessment/Plan Principal Problem:   Septic shock (HCC) Active Problems:   Malignant neoplasm of overlapping sites of left female breast ([place])  Septic shock, left breast cellulitis-systolic blood pressure 0000000, remained in the 70s despite 3 L sepsis fluid bolus.  Febrile 103.1, tachycardic to 132, significant leukocytosis of 20.5, increasing lactic acidosis 2.5 > 3.2.  Also  ongoing chemotherapy infusions, via Port-A-Cath last infusion 5/18, hence bacteremia also possible diagnosis. -Continue broad-spectrum antibiotics with IV Vanco cefepime and metronidazole -Started on Levophed via chemotherapy port-a-cart right upper chest. - 3L bolus given, continue N/s 100cc/hr x 1 day -Trend lactic acid -Follow-up blood cultures -Obtain UA and urine cultures -Left breast ultrasound -BMP, CBC a.m.  Left breast mass with metastasis to lungs-follows with Dr. Delton Coombes.  Currently undergoing chemotherapy infusions with docetaxel. -Oncology consult  Dementia, depression, history of psychotic disorder with delusions -On several psychoactive medications-patient unable to confirm what she takes, resume pending med- reconciliation- buspirone, Depakote, donepezil, Namenda, mirtazapine, Seroquel, and Zoloft.  DVT prophylaxis: SCDs for now pending left breast ultrasound. Code Status: Full code Family Communication: None at bedside Disposition Plan: > 2 days Consults called: Oncology consult, pending left breast ultrasound findings may need general surgery consult. Admission status: Inpatient, ICU I certify that at the point of admission it is my clinical judgment that the patient will require inpatient hospital care spanning beyond 2 midnights from the point of admission due to high intensity of service, high risk for further deterioration and high frequency of surveillance required. The following factors support the patient status of inpatient: Septic shock requiring vasopressors.   Bethena Roys MD [facility]  01/10/2020, 9:19 PM

## 2020-01-10 NOTE — ED Provider Notes (Signed)
[facility] EMERGENCY DEPARTMENT Provider Note   CSN: [id number] Arrival date & time: 01/10/20  1444     History Chief Complaint  Patient presents with  . Fever    [name] [name] is a 64 y.o. female.  Pt sent here from Oncology due to fever of 101.  Pt has a history of breast cancer and is receiving chemotherapy.  Pt has a history of alzheimers. Pt unable to provide any history   The history is provided by the patient. No language interpreter was used.  Fever Max temp prior to arrival:  101 Temp source:  Oral Severity:  Severe Onset quality:  Gradual Timing:  Constant Progression:  Worsening Chronicity:  New Relieved by:  Nothing Worsened by:  Nothing Ineffective treatments:  None tried Associated symptoms: confusion   Risk factors: hx of cancer        Past Medical History:  Diagnosis Date  . Alzheimer's dementia ([place])   . Bradycardia   . Breast cancer ([place])   . Dementia ([place])   . Depression   . Hypokalemia   . Hypotension   . 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 Dehydration 11/06/2019  . Goals of care, counseling/discussion 05/30/2018  . HER2-positive carcinoma of left breast ([place]) 05/30/2018  . Malignant neoplasm of overlapping sites of left female breast [facility])     Past Surgical History:  Procedure Laterality Date  . ABDOMINAL HYSTERECTOMY    . BREAST BIOPSY Left   . PORTACATH PLACEMENT Right 05/15/2018   Procedure: INSERTION PORT-A-CATH;  Surgeon: Aviva Signs, MD;  Location: [facility];  Service: General;  Laterality: Right;     OB History   No obstetric history on file.     History reviewed. No pertinent family history.  Social History   Tobacco Use  . Smoking status: Unknown If Ever Smoked  . Smokeless tobacco: Never Used  Substance Use Topics  . Alcohol use: Not Currently  . Drug use: Never    Home Medications Prior to Admission medications    Medication Sig Start Date End Date Taking? Authorizing Provider  acetaminophen (TYLENOL) 325 MG tablet Take 650 mg by mouth every 6 (six) hours as needed for mild pain.    [provider]  ALPRAZolam Duanne Moron) 0.5 MG tablet Take 0.5 mg by mouth 3 (three) times daily as needed.  01/12/19   [provider]  betamethasone dipropionate (DIPROLENE) 0.05 % cream 2 (two) times daily. Apply to left breast two times a day for dermatitis use on days that resident doesn't go for chemotherapy. 03/19/19   [provider]  busPIRone (BUSPAR) 5 MG tablet Take 5 mg by mouth 2 (two) times daily.    [provider]  divalproex (DEPAKOTE) 250 MG DR tablet Take 250 mg by mouth at bedtime.  12/02/18   [provider]  divalproex (DEPAKOTE) 500 MG DR tablet Take 500 mg by mouth every morning.  02/09/19   [provider]  docusate sodium (COLACE) 100 MG capsule Take 200 mg by mouth daily. May increase to 2 capsules in the am and 2 capsules in the pm. If no BM, call the [place].    [provider]  donepezil (ARICEPT) 10 MG tablet Take 10 mg by mouth at bedtime.  12/07/18   [provider]  HYDROcodone-acetaminophen (NORCO/VICODIN) 5-325 MG tablet Take 1 tablet by mouth every 6 (six) hours as needed.  03/19/19   [provider]  lidocaine-prilocaine (EMLA) cream Apply 1 application topically as needed (apply to port site topically every 21 days).    [provider]  memantine (NAMENDA) 10 MG tablet Take 10 mg by mouth 2 (two) times daily.    [provider]  mirtazapine (REMERON) 7.5 MG tablet Take 7.5 mg by mouth at bedtime.    [provider]  ondansetron (ZOFRAN) 4 MG tablet Take 1 tablet (4 mg total) by mouth every 8 (eight) hours as needed for nausea or vomiting. Patient not taking: Reported on 01/08/2020 06/12/19   Francene Finders L, NP-C  QUEtiapine (SEROQUEL) 25 MG tablet Take 12.5 mg by mouth 3 (three) times  daily.     [provider]  sertraline (ZOLOFT) 100 MG tablet Take 100 mg by mouth daily.     [provider]  VITAMIN D, CHOLECALCIFEROL, PO Take 2,000 Int'l Units by mouth daily.    [provider]  prochlorperazine (COMPAZINE) 10 MG tablet Take 1 tablet (10 mg total) by mouth every 6 (six) hours as needed (Nausea or vomiting). 05/30/18 01/18/19  Derek Jack, MD    Allergies    Patient has no known allergies.  Review of Systems   Review of Systems  Unable to perform ROS: Dementia  Constitutional: Positive for fever.  Psychiatric/Behavioral: Positive for confusion.  All other systems reviewed and are negative.   Physical Exam Updated Vital Signs BP (!) 87/73   Pulse (!) 114   Temp (!) 101.1 F (38.4 C) (Temporal)   Resp 18   Ht 5' 2" (1.575 m)   Wt 61 kg   SpO2 99%   BMI 24.60 kg/m   Physical Exam Vitals and nursing note reviewed.  Constitutional:      Appearance: She is well-developed.  HENT:     Head: Normocephalic.     Nose: Nose normal.     Mouth/Throat:     Mouth: Mucous membranes are moist.  Cardiovascular:     Rate and Rhythm: Normal rate.     Comments: Left breast large purple mass, erythema and swelling around area  Pulmonary:     Effort: Pulmonary effort is normal.  Abdominal:     General: Abdomen is flat. There is no distension.  Musculoskeletal:        General: Normal range of motion.     Cervical back: Normal range of motion.  Skin:    General: Skin is warm.  Neurological:     Mental Status: She is alert and oriented to person, place, and time.     ED Results / Procedures / Treatments   Labs (all labs ordered are listed, but only abnormal results are displayed) Labs Reviewed  CBC WITH DIFFERENTIAL/PLATELET - Abnormal; Notable for the following components:      Result Value   WBC 20.5 (*)    RDW 15.6 (*)    Neutro Abs 19.1 (*)    Abs Immature Granulocytes 0.15 (*)    All other components within normal  limits  COMPREHENSIVE METABOLIC PANEL - Abnormal; Notable for the following components:   Glucose, Bld 139 (*)    Calcium 8.4 (*)    Albumin 3.4 (*)    All other components within normal limits  LACTIC ACID, PLASMA - Abnormal; Notable for the following components:   Lactic Acid, Venous 2.5 (*)    All other components within normal limits  CULTURE, BLOOD (ROUTINE X 2)  CULTURE, BLOOD (ROUTINE X 2)  SARS  CORONAVIRUS 2 BY RT PCR [facility] ORDER, [place] LAB)  PROTIME-INR  APTT  LACTIC ACID, PLASMA    EKG EKG Interpretation  Date/Time:  Thursday Jan 10 2020 16:23:55 EDT Ventricular Rate:  121 PR Interval:    QRS Duration: 77 QT Interval:  307 QTC Calculation: 436 R Axis:   -30 Text Interpretation: Sinus tachycardia Left axis deviation Low voltage, precordial leads Borderline T abnormalities, anterior leads Since last tracing arm lead abnormaliity corrected Otherwise no significant change Confirmed by Daleen Bo [phone]) on 01/10/2020 4:36:42 PM   Radiology DG Chest Portable 1 View  Result Date: 01/10/2020 CLINICAL DATA:  Fever EXAM: PORTABLE CHEST 1 VIEW COMPARISON:  05/23/2018 FINDINGS: Right chest wall Port-A-Cath with tip at the lower SVC. Hazy opacity at the lateral right lung base. Normal cardiomediastinal contours. No pleural effusion or pneumothorax. Shallow lung inflation. IMPRESSION: Hazy opacity at the lateral right lung base may indicate atelectasis or infection. Electronically Signed   By: Ulyses Jarred M.D.   On: 01/10/2020 17:59    Procedures .Critical Care Performed by: Fransico Meadow, PA-C Authorized by: Fransico Meadow, PA-C   Critical care provider statement:    Critical care time (minutes):  45   Critical care start time:  01/10/2020 4:10 PM   Critical care end time:  01/10/2020 6:18 PM   Critical care time was exclusive of:  Separately billable procedures and treating other patients   Critical care was necessary to treat or  prevent imminent or life-threatening deterioration of the following conditions:  Dehydration and sepsis   Critical care was time spent personally by me on the following activities:  Examination of patient, evaluation of patient's response to treatment, review of old charts, re-evaluation of patient's condition, pulse oximetry, ordering and review of radiographic studies and ordering and review of laboratory studies   I assumed direction of critical care for this patient from another provider in my specialty: no     (including critical care time)  Medications Ordered in ED Medications  metroNIDAZOLE (FLAGYL) IVPB 500 mg (500 mg Intravenous New Bag/Given 01/10/20 1650)  ceFEPIme (MAXIPIME) 2 g in sodium chloride 0.9 % 100 mL IVPB (has no administration in time range)  vancomycin (VANCOREADY) IVPB 500 mg/100 mL (has no administration in time range)  sodium chloride 0.9 % bolus 1,000 mL (0 mLs Intravenous Stopped 01/10/20 1716)    And  sodium chloride 0.9 % bolus 1,000 mL (0 mLs Intravenous Stopped 01/10/20 1717)  ceFEPIme (MAXIPIME) 2 g in sodium chloride 0.9 % 100 mL IVPB (0 g Intravenous Stopped 01/10/20 1714)  vancomycin (VANCOCIN) IVPB 1000 mg/200 mL premix (1,000 mg Intravenous New Bag/Given 01/10/20 1651)  acetaminophen (TYLENOL) tablet 1,000 mg (1,000 mg Oral Given 01/10/20 1740)    ED Course  I have reviewed the triage vital signs and the nursing notes.  Pertinent labs & imaging results that were available during my care of the patient were reviewed by me and considered in my medical decision making (see chart for details).    MDM Rules/Calculators/A&P                      MDM:  Sepsis labs ordered, Pt given Iv fluids and tylenol, antibiotics ordered.   Hospitalist consulted for admission  Final Clinical Impression(s) / ED Diagnoses Final diagnoses:  Sepsis, due to unspecified organism, unspecified whether acute organ dysfunction present ([place])  Cellulitis of breast  Breast mass, left     Rx /  DC Orders ED Discharge Orders    None       Sidney Ace 01/10/20 1820    Daleen Bo, MD 01/11/20 [phone]

## 2020-01-10 NOTE — ED Notes (Signed)
Date and time results received: 01/10/20 5:17 PM   Test: lactic Critical Value: 2.5  Name of Provider Notified: Alyse Low   Orders Received? Or Actions Taken?:

## 2020-01-10 NOTE — ED Provider Notes (Signed)
  Face-to-face evaluation   History: Patient here to be evaluated for fever.  She has dementia, and breast cancer.  She was treated with chemotherapy infusion, 2 days ago.  Physical exam: She is alert responsive, she is confused.  She is unable to specify why she is here.  No respiratory distress.  Tachycardic.  Lungs with poor air movement anteriorly.  Abdomen soft and nontender.  Medical screening examination/treatment/procedure(s) were conducted as a shared visit with non-physician practitioner(s) and myself.  I personally evaluated the patient during the encounter    Daleen Bo, MD 01/11/20 1752

## 2020-01-10 NOTE — Progress Notes (Deleted)
Pt to clinic in w/c for Fulphila injection. Temp 101.8 temporal at front desk. Pt having shaking chills. Dr Delton Coombes notified and Fulphila injection held and pt taken to ER via wheelchair.

## 2020-01-10 NOTE — ED Notes (Signed)
2 sets of blood cultures obtained and sent to lab

## 2020-01-11 ENCOUNTER — Inpatient Hospital Stay (HOSPITAL_COMMUNITY): Payer: Medicare Other

## 2020-01-11 DIAGNOSIS — A419 Sepsis, unspecified organism: Principal | ICD-10-CM

## 2020-01-11 DIAGNOSIS — N6320 Unspecified lump in the left breast, unspecified quadrant: Secondary | ICD-10-CM | POA: Diagnosis present

## 2020-01-11 DIAGNOSIS — C50812 Malignant neoplasm of overlapping sites of left female breast: Secondary | ICD-10-CM

## 2020-01-11 DIAGNOSIS — R6521 Severe sepsis with septic shock: Secondary | ICD-10-CM

## 2020-01-11 LAB — BASIC METABOLIC PANEL
Anion gap: 10 (ref 5–15)
BUN: 10 mg/dL (ref 8–23)
CO2: 22 mmol/L (ref 22–32)
Calcium: 8 mg/dL — ABNORMAL LOW (ref 8.9–10.3)
Chloride: 109 mmol/L (ref 98–111)
Creatinine, Ser: 0.4 mg/dL — ABNORMAL LOW (ref 0.44–1.00)
GFR calc Af Amer: >60 mL/min (ref >60)
GFR calc non Af Amer: >60 mL/min (ref >60)
Glucose, Bld: 122 mg/dL — ABNORMAL HIGH (ref 70–99)
Potassium: 3.5 mmol/L (ref 3.5–5.1)
Sodium: 141 mmol/L (ref 135–145)

## 2020-01-11 LAB — CBC
HCT: 34.1 % — ABNORMAL LOW (ref 36.0–46.0)
Hemoglobin: 10.8 g/dL — ABNORMAL LOW (ref 12.0–15.0)
MCH: 30.3 pg (ref 26.0–34.0)
MCHC: 31.7 g/dL (ref 30.0–36.0)
MCV: 95.5 fL (ref 80.0–100.0)
Platelets: 137 10*3/uL — ABNORMAL LOW (ref 150–400)
RBC: 3.57 MIL/uL — ABNORMAL LOW (ref 3.87–5.11)
RDW: 15.1 % (ref 11.5–15.5)
WBC: 22.7 10*3/uL — ABNORMAL HIGH (ref 4.0–10.5)
nRBC: 0 % (ref 0.0–0.2)

## 2020-01-11 LAB — HIV ANTIBODY (ROUTINE TESTING W REFLEX): HIV Screen 4th Generation wRfx: NONREACTIVE

## 2020-01-11 MED ORDER — LORAZEPAM 2 MG/ML IJ SOLN: 0.5000 mg | Freq: Four times a day (QID) | INTRAMUSCULAR | Status: DC | PRN

## 2020-01-11 MED ORDER — CHLORHEXIDINE GLUCONATE CLOTH 2 % EX PADS
6.0000 | MEDICATED_PAD | Freq: Every day | CUTANEOUS | Status: DC
  Administered 2020-01-11 – 2020-01-15 (×5): 6 via TOPICAL

## 2020-01-11 NOTE — Care Management Important Message (Signed)
Important Message  Patient Details  Name: [name] [name] MRN: [id number] Date of Birth: 12-Oct-1955   Medicare Important Message Given:  Yes     Tommy Medal 01/11/2020, 11:45 AM

## 2020-01-11 NOTE — Progress Notes (Signed)
Patient Demographics:    [name] [name], is a 64 y.o. female, DOB - 1956-04-21, UJW:[id number]  Admit date - 01/10/2020   Admitting Physician Ejiroghene Arlyce Dice, MD  Outpatient Primary MD for the patient is [name], [name], MD  LOS - 1   Chief Complaint  Patient presents with  . Fever        Subjective:    [name] today has no fevers, no emesis,  No chest pain,   -Cognitive and memory deficits persist confused, -Did not do well hemodynamically with attempt to wean off Levophed became hypotensive persistently -Back on Levophed at 4 mics per hour  Assessment  & Plan :    Principal Problem:   Septic shock [facility]) Active Problems:   Malignant neoplasm of overlapping sites of left female breast [facility])   Breast mass, left  Brief Summary:- 64 y.o. female with medical history significant for left breast cancer with lung metastasis, dementia, depression admitted on 01/10/2020 with persistent hypotension and lactic acidosis in the setting of Severe sepsis with septic shock requiring IV Levophed   A/p Septic shock--of unknown source  ???  Right-sided pneumonia Vs Lt Breast infection --Did not do well hemodynamically with attempt to wean off Levophed became hypotensive persistently -Back on Levophed at 4 mics per hour at this time -Met severe sepsis with septic shock criteria on admission--on admission SBP was  73/48, remained in the 70s despite 3 L sepsis fluid bolus.  -Patient had persistent fevers, leukocytosis, tachycardia tachypnea -Patient is immunocompromise due to ongoing chemotherapy last dose 01/08/2020 --As per DR. Delton Coombes-- She could not receive Neulasta on 5/20/21due to her febrile illness. -If the white count starts trending down with ANC below 1500, give her Neupogen 300 mcg daily while she is in the hospital. Please call me if any questions --Treated with IV vancomycin, cefepime and  metronidazole -MRSA PCR negative Vanco discontinued 01/11/2020 -Continue cefepime and Flagyl for now -WBC up to 22.7, blood and urine cultures NGTD -Lactic acid down to 1.1 from 3.2 -If patient is found to be bacteremic right-sided chest Port-A-Cath will have to come out  2)Left breast mass with metastasis to lungs-follows with Dr. Delton Coombes.  Currently undergoing chemotherapy infusions with docetaxel. -Last chemo 01/08/2020 -Oncology consult appreciated -Left breast ultrasound to rule out abscess pending  3)Dementia, depression, history of psychotic disorder with delusions -At baseline moderate to severe cognitive and memory deficits with difficulty recognizing family members at times --PTA patient was on several psychoactive medications-including buspirone, Depakote, donepezil, Namenda, mirtazapine, Seroquel, and Zoloft.  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-- Pt is a DNR/DNI - POA (sister) wants to talk to Pt's daughter [name] ( who is an ultrasound tech in [employer] and just returned home from [place]--currently in [place]) --Continue full scope of treatment for now including pressors/IV Levophed  CRITICAL CARE Performed by: Roxan Hockey   Total critical care time: 43 minutes  Critical care time was exclusive of separately billable procedures and treating other patients.  Critical care was necessary to treat or prevent imminent or life-threatening deterioration.  - -Did not do well hemodynamically with attempt to wean off Levophed became hypotensive persistently -She is Back on Levophed at 4 mics per hour  Critical care was time spent  personally by me on the following activities: development of treatment plan with patient and/or surrogate as well as nursing, discussions with consultants, evaluation of patient's response to treatment, examination of patient, obtaining history from patient or surrogate, ordering and performing treatments and interventions, ordering and review of laboratory studies, ordering and review of radiographic studies, pulse oximetry and re-evaluation of patient's condition.   code Status:  DNR Family Communication:   Discussed with POA--her Sister [name] Disposition Plan:  Most likely return to [facility] Consults called: Oncology consult,    Disposition/Need for in-Hospital Stay- patient unable to be discharged at this time due to --septic shock requiring IV Levophed and IV antibiotics* -Patient From: [facility] D/C Place: SNF Barriers: Not Clinically Stable-   DVT Prophylaxis  :  SCDs-Q heparin   Lab Results  Component Value Date   PLT 137 (L) 01/11/2020    Inpatient Medications  Scheduled Meds: . Chlorhexidine Gluconate Cloth  6 each Topical Daily   Continuous Infusions: . sodium chloride 100 mL/hr at 01/11/20 1115  . ceFEPime (MAXIPIME) IV Stopped (01/11/20 0456)  . metronidazole Stopped (01/11/20 2202)  . norepinephrine (LEVOPHED) Adult infusion 4 mcg/min (01/11/20 1115)   PRN Meds:.acetaminophen **OR** acetaminophen, ondansetron **OR** ondansetron (ZOFRAN) IV, polyethylene glycol    Anti-infectives (From admission, onward)   Start     Dose/Rate Route Frequency Ordered Stop   01/11/20 0400  ceFEPIme (MAXIPIME) 2 g in sodium chloride 0.9 % 100 mL IVPB     2 g 200 mL/hr over 30 Minutes Intravenous Every 12 hours 01/10/20 1710     01/11/20 0300  vancomycin (VANCOREADY) IVPB 500 mg/100 mL  Status:  Discontinued     500 mg 100 mL/hr over 60 Minutes Intravenous Every 12 hours 01/10/20 1710 01/11/20 0959   01/10/20 1630  ceFEPIme (MAXIPIME) 2 g in sodium  chloride 0.9 % 100 mL IVPB     2 g 200 mL/hr over 30 Minutes Intravenous  Once 01/10/20 1617 01/10/20 1714   01/10/20 1630  metroNIDAZOLE (FLAGYL) IVPB 500 mg     500 mg 100 mL/hr over 60 Minutes Intravenous Every 8 hours 01/10/20 1617     01/10/20 1630  vancomycin (VANCOCIN) IVPB 1000 mg/200 mL premix     1,000 mg 200 mL/hr over 60 Minutes Intravenous  Once 01/10/20 1617 01/10/20 1751        Objective:   Vitals:   01/11/20 1115 01/11/20 1127 01/11/20 1130 01/11/20 1145  BP: (!) 92/55  105/64 100/66  Pulse: 85  80 74  Resp:      Temp:  98.3 F (36.8 C)    TempSrc:  Oral    SpO2: 96%  98% 96%  Weight:      Height:        Wt Readings from Last 3 Encounters:  01/10/20 63.3 kg  01/08/20 61 kg  12/18/19 60.5 kg     Intake/Output Summary (Last 24 hours) at 01/11/2020 1149 Last data filed at 01/11/2020 1115 Gross per 24 hour  Intake 2747.25 ml  Output 1300 ml  Net 1447.25 ml     Physical Exam  Gen:-Appears comfortable,  HEENT:- .AT, No sclera icterus Neck-Supple Neck,No JVD,.  Lungs-diminished in bases, no wheezing  Breast--left breast redness mass--- please see photos in epic CV- S1, S2 normal, regular , right-sided chest Port-A-Cath site is clean dry and intact Abd-  +ve B.Sounds, Abd Soft, No tenderness,    Extremity/Skin:- No  edema, pedal pulses present  Psych-significant memory and cognitive deficits at baseline  neuro-generalized weakness, no new focal deficits, no tremors   Data Review:   Micro Results Recent Results (from the past 240 hour(s))  Culture, blood (x 2)     Status: None (Preliminary result)   Collection Time: 01/10/20  4:16 PM   Specimen: BLOOD LEFT ARM  Result Value Ref Range Status   Specimen Description BLOOD LEFT ARM  Final   Special Requests   Final    BOTTLES DRAWN AEROBIC AND ANAEROBIC Blood Culture adequate volume   Culture   Final    NO GROWTH < 24 HOURS Performed at [facility], [address], [place], [place] [address]     Report Status PENDING  Incomplete  Culture, blood (x 2)     Status: None (Preliminary result)   Collection Time: 01/10/20  4:22 PM   Specimen: BLOOD RIGHT ARM  Result Value Ref Range Status   Specimen Description BLOOD RIGHT ARM  Final   Special Requests   Final    BOTTLES DRAWN AEROBIC AND ANAEROBIC Blood Culture adequate volume   Culture   Final    NO GROWTH < 24 HOURS Performed at [facility], [address] [place], [place] [address]    Report Status PENDING  Incomplete  SARS Coronavirus 2 by RT PCR (hospital order, performed in [place] hospital lab) Nasopharyngeal Nasopharyngeal Swab     Status: None   Collection Time: 01/10/20  7:47 PM   Specimen: Nasopharyngeal Swab  Result Value Ref Range Status   SARS Coronavirus 2 NEGATIVE NEGATIVE Final    Comment: (NOTE) SARS-CoV-2 target nucleic acids are NOT DETECTED. The SARS-CoV-2 RNA is generally detectable in upper and lower respiratory specimens during the acute phase of infection. The lowest concentration of SARS-CoV-2 viral copies this assay can detect is 250 copies / mL. A negative result does not preclude SARS-CoV-2 infection and should not be used as the sole basis for treatment or other patient management decisions.  A negative result may occur with improper specimen collection / handling, submission of specimen other than nasopharyngeal swab, presence of viral mutation(s) within the areas targeted by this assay, and inadequate number of viral copies (<250 copies / mL). A negative result must be combined with clinical observations, patient history, and epidemiological information. Fact Sheet for Patients:   StrictlyIdeas.no Fact Sheet for Healthcare Providers: BankingDealers.co.za This test is not yet approved or cleared  by the [place] FDA and has been authorized for detection and/or diagnosis of SARS-CoV-2 by FDA under an Emergency Use Authorization (EUA).  This  EUA will remain in effect (meaning this test can be used) for the duration of the COVID-19 declaration under Section 564(b)(1) of the Act, 21 U.S.C. section 360bbb-3(b)(1), unless the authorization is terminated or revoked sooner. Performed at [facility], [address], [place], [place] [address]   MRSA PCR Screening     Status: None   Collection Time: 01/10/20  9:39 PM   Specimen: Nasal Mucosa; Nasopharyngeal  Result Value Ref Range Status   MRSA by PCR NEGATIVE NEGATIVE Final    Comment:        The GeneXpert MRSA Assay (FDA approved for NASAL specimens only), is one component of a comprehensive MRSA  colonization surveillance program. It is not intended to diagnose MRSA infection nor to guide or monitor treatment for MRSA infections. Performed at [facility], [address], [place], [place] [address]     Radiology Reports DG Chest Portable 1 View  Result Date: 01/10/2020 CLINICAL DATA:  Fever EXAM: PORTABLE CHEST 1 VIEW COMPARISON:  05/23/2018 FINDINGS: Right chest wall Port-A-Cath with tip at the lower SVC. Hazy opacity at the lateral right lung base. Normal cardiomediastinal contours. No pleural effusion or pneumothorax. Shallow lung inflation. IMPRESSION: Hazy opacity at the lateral right lung base may indicate atelectasis or infection. Electronically Signed   By: Ulyses Jarred M.D.   On: 01/10/2020 17:59     CBC Recent Labs  Lab 01/08/20 0755 01/10/20 1616 01/11/20 0324  WBC 11.1* 20.5* 22.7*  HGB 11.8* 12.2 10.8*  HCT 37.7 38.5 34.1*  PLT 209 170 137*  MCV 97.7 96.3 95.5  MCH 30.6 30.5 30.3  MCHC 31.3 31.7 31.7  RDW 15.8* 15.6* 15.1  LYMPHSABS 1.5 0.7  --   MONOABS 0.8 0.3  --   EOSABS 0.1 0.3  --   BASOSABS 0.1 0.1  --     Chemistries  Recent Labs  Lab 01/08/20 0755 01/10/20 1616 01/11/20 0324  NA 140 135 141  K 4.3 3.7 3.5  CL 103 102 109  CO2 _0 GLUCOSE 98 139* 122*  BUN _1 CREATININE 0.50 0.63 0.40*  CALCIUM 9.0 8.4* 8.0*    MG 2.3  --   --   AST 23 23  --   ALT 13 13  --   ALKPHOS 110 86  --   BILITOT 0.5 0.8  --    ------------------------------------------------------------------------------------------------------------------ No results for input(s): CHOL, HDL, LDLCALC, TRIG, CHOLHDL, LDLDIRECT in the last 72 hours.  No results found for: HGBA1C ------------------------------------------------------------------------------------------------------------------ No results for input(s): TSH, T4TOTAL, T3FREE, THYROIDAB in the last 72 hours.  Invalid input(s): FREET3 ------------------------------------------------------------------------------------------------------------------ No results for input(s): VITAMINB12, FOLATE, FERRITIN, TIBC, IRON, RETICCTPCT in the last 72 hours.  Coagulation profile Recent Labs  Lab 01/10/20 1616  INR 1.2    No results for input(s): DDIMER in the last 72 hours.  Cardiac Enzymes No results for input(s): CKMB, TROPONINI, MYOGLOBIN in the last 168 hours.  Invalid input(s): CK ------------------------------------------------------------------------------------------------------------------ No results found for: BNP   Roxan Hockey M.D on 01/11/2020 at 11:49 AM  Go to [email or web address] - for contact info  [facility] - Office  [phone]       \

## 2020-01-11 NOTE — Consult Note (Signed)
[facility] Consultation Oncology  Name: [name] [name]      MRN: [id number]    Location: IC04/IC04-01  Date: 01/11/2020 Time:11:21 AM   REFERRING PHYSICIAN: Dr. Joesph Fillers  REASON FOR CONSULT: Metastatic breast cancer on chemotherapy   DIAGNOSIS: Infection and sepsis  HISTORY OF PRESENT ILLNESS: Li is a 64 year old very pleasant white female seen in consultation today at the request of Dr. Joesph Fillers. She has metastatic HER-2 positive breast cancer for which she is actively receiving chemotherapy and Herceptin. Last treatment was on 01/08/2020 with docetaxel and Herceptin. Patient came to our office yesterday, 01/10/2020 for Neulasta injection. However she was slumped over in the wheelchair and also had a temperature of 103. At that point we sent her to the ER. Blood cultures were drawn. Urinalysis was negative. Urine cultures are also pending. Chest x-ray showed possible right lateral lung infiltrate. White count was elevated between 20-22K. Predominantly neutrophils. She is currently in ICU. As her blood pressure was low, she was started on low-dose of Levophed. She is sitting up in the bed and had her breakfast and looks good. She denies any pain. No changes in her cough or expectoration. Denies any dysuria.  PAST MEDICAL HISTORY:   Past Medical History:  Diagnosis Date  . Alzheimer's dementia ([place])   . Bradycardia   . Breast cancer ([place])   . Dementia ([place])   . Depression   . Hypokalemia   . Hypotension   . 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     PAST SURGICAL HISTORY Past Surgical History:  Procedure Laterality Date  . ABDOMINAL HYSTERECTOMY    . BREAST BIOPSY Left   . PORTACATH PLACEMENT Right 05/15/2018   Procedure: INSERTION PORT-A-CATH;  Surgeon: Aviva Signs, MD;  Location: [facility];  Service: General;  Laterality: Right;    FAMILY  HISTORY: History reviewed. No pertinent family history.  SOCIAL HISTORY:  has an unknown smoking status. She has never used smokeless tobacco. She reports previous alcohol use. She reports that she does not use drugs.  PERFORMANCE STATUS: The patient's performance status is 2 - Symptomatic, <50% confined to bed  PHYSICAL EXAM: Most Recent Vital Signs: Blood pressure (!) 92/55, pulse 85, temperature (!) 102.1 F (38.9 C), temperature source Oral, resp. rate (!) 21, height _0  (1.575 m), weight 139 lb 8.8 oz (63.3 kg), SpO2 96 %. BP (!) 92/55   Pulse 85   Temp (!) 102.1 F (38.9 C) (Oral)   Resp (!) 21   Ht _1  (1.575 m)   Wt 139 lb 8.8 oz (63.3 kg)   SpO2 96%   BMI 25.52 kg/m  General appearance: alert, cooperative and appears stated age Lungs: Bilateral air entry present. Heart: regular rate and rhythm Abdomen: Soft, nontender with no palpable organomegaly. Extremities: No edema or cyanosis. Skin: Skin over the left breast mass is erythematous and stable. No sign of any infection. Neurologic: Grossly normal  LABORATORY DATA:  Results for orders placed or performed during the hospital encounter of 01/10/20 (from the past 48 hour(s))  Culture, blood (x 2)     Status: None (Preliminary result)   Collection Time: 01/10/20  4:16 PM   Specimen: BLOOD LEFT ARM  Result Value Ref Range   Specimen Description BLOOD LEFT ARM    Special Requests      BOTTLES DRAWN AEROBIC AND ANAEROBIC Blood Culture adequate  volume   Culture      NO GROWTH < 24 HOURS Performed at [facility], [address], [place], [place] [address]    Report Status PENDING   CBC with Differential     Status: Abnormal   Collection Time: 01/10/20  4:16 PM  Result Value Ref Range   WBC 20.5 (H) 4.0 - 10.5 K/uL   RBC 4.00 3.87 - 5.11 MIL/uL   Hemoglobin 12.2 12.0 - 15.0 g/dL   HCT 38.5 36.0 - 46.0 %   MCV 96.3 80.0 - 100.0 fL   MCH 30.5 26.0 - 34.0 pg   MCHC 31.7 30.0 - 36.0 g/dL   RDW 15.6 (H) 11.5 -  15.5 %   Platelets 170 150 - 400 K/uL   nRBC 0.0 0.0 - 0.2 %   Neutrophils Relative % 93 %   Neutro Abs 19.1 (H) 1.7 - 7.7 K/uL   Lymphocytes Relative 3 %   Lymphs Abs 0.7 0.7 - 4.0 K/uL   Monocytes Relative 1 %   Monocytes Absolute 0.3 0.1 - 1.0 K/uL   Eosinophils Relative 2 %   Eosinophils Absolute 0.3 0.0 - 0.5 K/uL   Basophils Relative 0 %   Basophils Absolute 0.1 0.0 - 0.1 K/uL   Immature Granulocytes 1 %   Abs Immature Granulocytes 0.15 (H) 0.00 - 0.07 K/uL    Comment: Performed at [facility], [address], [place], [place] [address]  Comprehensive metabolic panel     Status: Abnormal   Collection Time: 01/10/20  4:16 PM  Result Value Ref Range   Sodium 135 135 - 145 mmol/L   Potassium 3.7 3.5 - 5.1 mmol/L   Chloride 102 98 - 111 mmol/L   CO2 24 22 - 32 mmol/L   Glucose, Bld 139 (H) 70 - 99 mg/dL    Comment: Glucose reference range applies only to samples taken after fasting for at least 8 hours.   BUN 19 8 - 23 mg/dL   Creatinine, Ser 0.63 0.44 - 1.00 mg/dL   Calcium 8.4 (L) 8.9 - 10.3 mg/dL   Total Protein 7.2 6.5 - 8.1 g/dL   Albumin 3.4 (L) 3.5 - 5.0 g/dL   AST 23 15 - 41 U/L   ALT 13 0 - 44 U/L   Alkaline Phosphatase 86 38 - 126 U/L   Total Bilirubin 0.8 0.3 - 1.2 mg/dL   GFR calc non Af Amer >60 >60 mL/min   GFR calc Af Amer >60 >60 mL/min   Anion gap 9 5 - 15    Comment: Performed at [facility], [name] [place], [place] [address]  Protime-INR     Status: None   Collection Time: 01/10/20  4:16 PM  Result Value Ref Range   Prothrombin Time 14.4 11.4 - 15.2 seconds   INR 1.2 0.8 - 1.2    Comment: (NOTE) INR goal varies based on device and disease states. Performed at [facility], [address], [place], [place] [address]   APTT     Status: None   Collection Time: 01/10/20  4:16 PM  Result Value Ref Range   aPTT 34 24 - 36 seconds    Comment: Performed at [facility], [address], [place], [place] [address]  HIV Antibody (routine testing w  rflx)     Status: None   Collection Time: 01/10/20  4:16 PM  Result Value Ref Range   HIV Screen 4th Generation wRfx Non Reactive Non Reactive    Comment:  Performed at [facility], [place] [address], [place], [place] [address]  Lactic acid, plasma     Status: Abnormal   Collection Time: 01/10/20  4:17 PM  Result Value Ref Range   Lactic Acid, Venous 2.5 (HH) 0.5 - 1.9 mmol/L    Comment: CRITICAL RESULT CALLED TO, READ BACK BY AND VERIFIED WITH: STEINHAUER,C ON 01/10/20 AT 1715 BY LOY,C Performed at [facility], [address] [place], [place] [address]   Culture, blood (x 2)     Status: None (Preliminary result)   Collection Time: 01/10/20  4:22 PM   Specimen: BLOOD RIGHT ARM  Result Value Ref Range   Specimen Description BLOOD RIGHT ARM    Special Requests      BOTTLES DRAWN AEROBIC AND ANAEROBIC Blood Culture adequate volume   Culture      NO GROWTH < 24 HOURS Performed at [facility], [address] [place], [place] [address]    Report Status PENDING   Lactic acid, plasma     Status: Abnormal   Collection Time: 01/10/20  6:10 PM  Result Value Ref Range   Lactic Acid, Venous 3.2 (HH) 0.5 - 1.9 mmol/L    Comment: CRITICAL VALUE NOTED.  VALUE IS CONSISTENT WITH PREVIOUSLY REPORTED AND CALLED VALUE. Performed at [facility], [address] [place], [place] [address]   SARS Coronavirus 2 by RT PCR (hospital order, performed in [facility] hospital lab) Nasopharyngeal Nasopharyngeal Swab     Status: None   Collection Time: 01/10/20  7:47 PM   Specimen: Nasopharyngeal Swab  Result Value Ref Range   SARS Coronavirus 2 NEGATIVE NEGATIVE    Comment: (NOTE) SARS-CoV-2 target nucleic acids are NOT DETECTED. The SARS-CoV-2 RNA is generally detectable in upper and lower respiratory specimens during the acute phase of infection. The lowest concentration of SARS-CoV-2 viral copies this assay can detect is 250 copies / mL. A negative result does not preclude SARS-CoV-2  infection and should not be used as the sole basis for treatment or other patient management decisions.  A negative result may occur with improper specimen collection / handling, submission of specimen other than nasopharyngeal swab, presence of viral mutation(s) within the areas targeted by this assay, and inadequate number of viral copies (<250 copies / mL). A negative result must be combined with clinical observations, patient history, and epidemiological information. Fact Sheet for Patients:   StrictlyIdeas.no Fact Sheet for Healthcare Providers: BankingDealers.co.za This test is not yet approved or cleared  by the [place] FDA and has been authorized for detection and/or diagnosis of SARS-CoV-2 by FDA under an Emergency Use Authorization (EUA).  This EUA will remain in effect (meaning this test can be used) for the duration of the COVID-19 declaration under Section 564(b)(1) of the Act, 21 U.S.C. section 360bbb-3(b)(1), unless the authorization is terminated or revoked sooner. Performed at [facility], [address] [place], [place] [address]   Urinalysis, Routine w reflex microscopic     Status: Abnormal   Collection Time: 01/10/20  8:50 PM  Result Value Ref Range   Color, Urine STRAW (A) YELLOW   APPearance CLEAR CLEAR   Specific Gravity, Urine 1.005 1.005 - 1.030   pH 5.0 5.0 - 8.0   Glucose, UA NEGATIVE NEGATIVE mg/dL   Hgb urine dipstick SMALL (A) NEGATIVE   Bilirubin Urine NEGATIVE NEGATIVE   Ketones, ur NEGATIVE NEGATIVE mg/dL   Protein, ur NEGATIVE NEGATIVE mg/dL   Nitrite NEGATIVE NEGATIVE   Leukocytes,Ua NEGATIVE NEGATIVE  WBC, UA 0-5 0 - 5 WBC/hpf   Bacteria, UA NONE SEEN NONE SEEN   Squamous Epithelial / LPF 0-5 0 - 5    Comment: Performed at [facility], [address], [place], [place] [address]  MRSA PCR Screening     Status: None   Collection Time: 01/10/20  9:39 PM   Specimen: Nasal Mucosa;  Nasopharyngeal  Result Value Ref Range   MRSA by PCR NEGATIVE NEGATIVE    Comment:        The GeneXpert MRSA Assay (FDA approved for NASAL specimens only), is one component of a comprehensive MRSA colonization surveillance program. It is not intended to diagnose MRSA infection nor to guide or monitor treatment for MRSA infections. Performed at [facility], [address] [place], [place] [address]   Lactic acid, plasma     Status: None   Collection Time: 01/10/20  9:51 PM  Result Value Ref Range   Lactic Acid, Venous 1.1 0.5 - 1.9 mmol/L    Comment: Performed at [facility], [address], [place], [place] [address]  Basic metabolic panel     Status: Abnormal   Collection Time: 01/11/20  3:24 AM  Result Value Ref Range   Sodium 141 135 - 145 mmol/L   Potassium 3.5 3.5 - 5.1 mmol/L   Chloride 109 98 - 111 mmol/L   CO2 22 22 - 32 mmol/L   Glucose, Bld 122 (H) 70 - 99 mg/dL    Comment: Glucose reference range applies only to samples taken after fasting for at least 8 hours.   BUN 10 8 - 23 mg/dL   Creatinine, Ser 0.40 (L) 0.44 - 1.00 mg/dL   Calcium 8.0 (L) 8.9 - 10.3 mg/dL   GFR calc non Af Amer >60 >60 mL/min   GFR calc Af Amer >60 >60 mL/min   Anion gap 10 5 - 15    Comment: Performed at [facility], [address], [place], [place] [address]  CBC     Status: Abnormal   Collection Time: 01/11/20  3:24 AM  Result Value Ref Range   WBC 22.7 (H) 4.0 - 10.5 K/uL   RBC 3.57 (L) 3.87 - 5.11 MIL/uL   Hemoglobin 10.8 (L) 12.0 - 15.0 g/dL   HCT 34.1 (L) 36.0 - 46.0 %   MCV 95.5 80.0 - 100.0 fL   MCH 30.3 26.0 - 34.0 pg   MCHC 31.7 30.0 - 36.0 g/dL   RDW 15.1 11.5 - 15.5 %   Platelets 137 (L) 150 - 400 K/uL   nRBC 0.0 0.0 - 0.2 %    Comment: Performed at [facility], [address], [place], [place] [address]      RADIOGRAPHY: DG Chest Portable 1 View  Result Date: 01/10/2020 CLINICAL DATA:  Fever EXAM: PORTABLE CHEST 1 VIEW COMPARISON:  05/23/2018 FINDINGS: Right chest  wall Port-A-Cath with tip at the lower SVC. Hazy opacity at the lateral right lung base. Normal cardiomediastinal contours. No pleural effusion or pneumothorax. Shallow lung inflation. IMPRESSION: Hazy opacity at the lateral right lung base may indicate atelectasis or infection. Electronically Signed   By: Ulyses Jarred M.D.   On: 01/10/2020 17:59       ASSESSMENT and PLAN:  1. Infection/sepsis: -Patient had chemotherapy on 01/08/2020 and came in with fever of 103. Temperature this morning is 102. -Patient is on broad-spectrum antibiotics including vancomycin, cefepime and Flagyl. -I reviewed chest x-ray which showed questionable infiltrate in the right lateral lung lung but not  impressive. UA is negative. Blood cultures so far did not grow any. -White count is elevated at 22 predominantly neutrophils. -Patient on low-dose Levophed to maintain blood pressure. -Continue antibiotics and follow-up on cultures. I do not believe there is infection in the left breast area as her mass has always been like what it is now. -I called and talked to her Lawson Fiscal and informed her of the progress.  2. Metastatic HER-2 positive left breast cancer to the lungs: -3 cycles of docetaxel and Herceptin from 11/27/2019 through 01/08/2020. -She could not receive Neulasta yesterday because of her febrile illness. -If the white count starts trending down with ANC below 1500, give her Neupogen 300 mcg daily while she is in the hospital. Please call me if any questions.  All questions were answered. The patient knows to call the clinic with any problems, questions or concerns. We can certainly see the patient much sooner if necessary.    Derek Jack

## 2020-01-12 LAB — BASIC METABOLIC PANEL
Anion gap: 8 (ref 5–15)
BUN: 8 mg/dL (ref 8–23)
CO2: 23 mmol/L (ref 22–32)
Calcium: 8.2 mg/dL — ABNORMAL LOW (ref 8.9–10.3)
Chloride: 102 mmol/L (ref 98–111)
Creatinine, Ser: 0.45 mg/dL (ref 0.44–1.00)
GFR calc Af Amer: >60 mL/min (ref >60)
GFR calc non Af Amer: >60 mL/min (ref >60)
Glucose, Bld: 107 mg/dL — ABNORMAL HIGH (ref 70–99)
Potassium: 3.2 mmol/L — ABNORMAL LOW (ref 3.5–5.1)
Sodium: 133 mmol/L — ABNORMAL LOW (ref 135–145)

## 2020-01-12 LAB — CBC
HCT: 33.7 % — ABNORMAL LOW (ref 36.0–46.0)
Hemoglobin: 10.8 g/dL — ABNORMAL LOW (ref 12.0–15.0)
MCH: 30.3 pg (ref 26.0–34.0)
MCHC: 32 g/dL (ref 30.0–36.0)
MCV: 94.4 fL (ref 80.0–100.0)
Platelets: 115 10*3/uL — ABNORMAL LOW (ref 150–400)
RBC: 3.57 MIL/uL — ABNORMAL LOW (ref 3.87–5.11)
RDW: 14.9 % (ref 11.5–15.5)
WBC: 11 10*3/uL — ABNORMAL HIGH (ref 4.0–10.5)
nRBC: 0 % (ref 0.0–0.2)

## 2020-01-12 LAB — URINE CULTURE: Culture: NO GROWTH

## 2020-01-12 NOTE — Progress Notes (Signed)
Patient Demographics:    [name] [name], is a 64 y.o. female, DOB - 28-Jan-1956, BSJ:[id number]  Admit date - 01/10/2020   Admitting Physician Ejiroghene Arlyce Dice, MD  Outpatient Primary MD for the patient is [name], [name] Charters, MD  LOS - 2  Chief Complaint  Patient presents with  . Fever       Subjective:    [name] [name] today has no fevers, no emesis,  No chest pain,   -Cognitive and memory deficits persist confused, -Blood pressure has improved weaned off Levophed drip --I called and updated patient's sister/POA  Assessment  & Plan :    Principal Problem:   Septic shock ([place]) Active Problems:   Malignant neoplasm of overlapping sites of left female breast ([place])   Breast mass, left  Brief Summary:- 64 y.o. female with medical history significant for left breast cancer with lung metastasis, dementia, depression admitted on 01/10/2020 with persistent hypotension and lactic acidosis in the setting of Severe sepsis with septic shock requiring IV Levophed -Weaned off Levophed drip on 01/12/2020   A/p Septic shock--of unknown source  ???  Right-sided pneumonia Vs Lt Breast infection Pt Met severe sepsis with septic shock criteria on admission--on admission SBP was  73/48, remained in the 70s despite 3 L sepsis fluid bolus.  -Patient had persistent fevers, leukocytosis, tachycardia tachypnea --Weaned off Levophed drip on 01/12/2020 -Patient is immunocompromise due to ongoing chemotherapy last dose 01/08/2020 --As per DR. Delton Coombes-- She could not receive Neulasta on 5/20/21due to her febrile illness. -If the white count starts trending down with ANC below 1500, give her Neupogen 300 mcg daily while she is in the hospital. Please call me if any questions --Treated with IV vancomycin, cefepime and metronidazole -MRSA PCR negative Vanco discontinued 01/11/2020 -Continue cefepime and Flagyl for now -WBC down to  11k from  22.7,  --Blood and urine cultures NGTD -Lactic acid down to 1.1 from 3.2 -If patient is found to be bacteremic right-sided chest Port-A-Cath will have to come out  2)Left breast mass with metastasis to lungs-follows with Dr. Delton Coombes.  Currently undergoing chemotherapy infusions with docetaxel. -Last chemo 01/08/2020 -Oncology consult appreciated -Left breast ultrasound to rule out abscess still not read by radiologist   3)Dementia, depression, history of psychotic disorder with delusions -At baseline moderate to severe cognitive and memory deficits with difficulty recognizing family members at times --PTA patient was on several psychoactive medications-including buspirone, Depakote, donepezil, Namenda, mirtazapine, Seroquel, and Zoloft.  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-- Pt is a DNR/DNI - POA (sister) wants to talk to Pt's daughter [name] [name] ( who is an ultrasound tech in [employer] and just returned home from [place]--currently in [place]) --Continue full scope of treatment for now including pressors/IV Levophed   Code Status:  DNR Family Communication:   Discussed with POA--[name]:  Most likely return [facility] Consults called: Oncology consult,    Disposition/Need for in-Hospital Stay- patient unable to be discharged  at this time due to --septic shock requiring IV Levophed and IV antibiotics* -Patient From: [facility] SNF D/C Place: SNF Barriers: Not Clinically Stable-   DVT Prophylaxis  :  SCDs-Q heparin   Lab Results  Component Value Date   PLT 115 (L) 01/12/2020    Inpatient  Medications  Scheduled Meds: . Chlorhexidine Gluconate Cloth  6 each Topical Daily   Continuous Infusions: . ceFEPime (MAXIPIME) IV 2 g (01/12/20 1021)  . metronidazole 100 mL/hr at 01/12/20 0841  . norepinephrine (LEVOPHED) Adult infusion Stopped (01/12/20 0741)   PRN Meds:.acetaminophen **OR** acetaminophen, LORazepam, ondansetron **OR** ondansetron (ZOFRAN) IV, polyethylene glycol    Anti-infectives (From admission, onward)   Start     Dose/Rate Route Frequency Ordered Stop   01/11/20 0400  ceFEPIme (MAXIPIME) 2 g in sodium chloride 0.9 % 100 mL IVPB     2 g 200 mL/hr over 30 Minutes Intravenous Every 12 hours 01/10/20 1710     01/11/20 0300  vancomycin (VANCOREADY) IVPB 500 mg/100 mL  Status:  Discontinued     500 mg 100 mL/hr over 60 Minutes Intravenous Every 12 hours 01/10/20 1710 01/11/20 0959   01/10/20 1630  ceFEPIme (MAXIPIME) 2 g in sodium chloride 0.9 % 100 mL IVPB     2 g 200 mL/hr over 30 Minutes Intravenous  Once 01/10/20 1617 01/10/20 1714   01/10/20 1630  metroNIDAZOLE (FLAGYL) IVPB 500 mg     500 mg 100 mL/hr over 60 Minutes Intravenous Every 8 hours 01/10/20 1617     01/10/20 1630  vancomycin (VANCOCIN) IVPB 1000 mg/200 mL premix     1,000 mg 200 mL/hr over 60 Minutes Intravenous  Once 01/10/20 1617 01/10/20 1751        Objective:   Vitals:   01/12/20 1100 01/12/20 1122 01/12/20 1200 01/12/20 1230  BP: (!) 115/47  (!) 130/59   Pulse: 95  (!) 108   Resp: '20  19 15  ' Temp:  99.1 F (37.3 C)    TempSrc:  Oral    SpO2: 98%  100%   Weight:      Height:        Wt Readings from Last 3 Encounters:  01/12/20 65.2 kg  01/08/20 61 kg  12/18/19 60.5 kg     Intake/Output Summary (Last 24 hours) at 01/12/2020 1250 Last data filed at 01/12/2020 0954 Gross per 24 hour  Intake 1256.75 ml  Output 1400 ml  Net -143.25 ml     Physical Exam  Gen:-In no acute distress, resting comfortably HEENT:- [place].AT, No sclera icterus Neck-Supple Neck,No JVD,.    Lungs-diminished in bases, no wheezing Breast--left breast redness mass--- please see photos in epic CV- S1, S2 normal, regular , right-sided chest Port-A-Cath site is clean dry and intact Abd-  +ve B.Sounds, Abd Soft, No tenderness,    Extremity/Skin:- No  edema, pedal pulses present  Psych-significant memory and cognitive deficits at baseline  neuro-generalized weakness, no new focal deficits, no tremors   Data Review:   Micro Results Recent Results (from the past 240 hour(s))  Culture, blood (x 2)     Status: None (Preliminary result)   Collection Time: 01/10/20  4:16 PM   Specimen: BLOOD LEFT ARM  Result Value Ref Range Status   Specimen Description BLOOD LEFT ARM  Final   Special Requests   Final    BOTTLES DRAWN AEROBIC AND ANAEROBIC Blood Culture adequate volume   Culture   Final    NO GROWTH 2 DAYS Performed  at [facility], [address] [place], [place] [address]    Report Status PENDING  Incomplete  Culture, blood (x 2)     Status: None (Preliminary result)   Collection Time: 01/10/20  4:22 PM   Specimen: BLOOD RIGHT ARM  Result Value Ref Range Status   Specimen Description BLOOD RIGHT ARM  Final   Special Requests   Final    BOTTLES DRAWN AEROBIC AND ANAEROBIC Blood Culture adequate volume   Culture   Final    NO GROWTH 2 DAYS Performed at [facility], [address], [place], [place] [address]    Report Status PENDING  Incomplete  SARS Coronavirus 2 by RT PCR (hospital order, performed in [place] hospital lab) Nasopharyngeal Nasopharyngeal Swab     Status: None   Collection Time: 01/10/20  7:47 PM   Specimen: Nasopharyngeal Swab  Result Value Ref Range Status   SARS Coronavirus 2 NEGATIVE NEGATIVE Final    Comment: (NOTE) SARS-CoV-2 target nucleic acids are NOT DETECTED. The SARS-CoV-2 RNA is generally detectable in upper and lower respiratory specimens during the acute phase of infection. The lowest concentration of SARS-CoV-2 viral copies this assay  can detect is 250 copies / mL. A negative result does not preclude SARS-CoV-2 infection and should not be used as the sole basis for treatment or other patient management decisions.  A negative result may occur with improper specimen collection / handling, submission of specimen other than nasopharyngeal swab, presence of viral mutation(s) within the areas targeted by this assay, and inadequate number of viral copies (<250 copies / mL). A negative result must be combined with clinical observations, patient history, and epidemiological information. Fact Sheet for Patients:   StrictlyIdeas.no Fact Sheet for Healthcare Providers: BankingDealers.co.za This test is not yet approved or cleared  by the [place] FDA and has been authorized for detection and/or diagnosis of SARS-CoV-2 by FDA under an Emergency Use Authorization (EUA).  This EUA will remain in effect (meaning this test can be used) for the duration of the COVID-19 declaration under Section 564(b)(1) of the Act, 21 U.S.C. section 360bbb-3(b)(1), unless the authorization is terminated or revoked sooner. Performed at [facility], [address] [place], [place] [address]   Culture, Urine     Status: None   Collection Time: 01/10/20  8:50 PM   Specimen: Urine, Random  Result Value Ref Range Status   Specimen Description   Final    URINE, RANDOM Performed at [facility], [address], [place], [place] [address]    Special Requests   Final    NONE Performed at [facility], [address] [place], [name] [address]    Culture   Final    NO GROWTH Performed at [facility], [place] [address], [place], [place] [address]    Report Status 01/12/2020 FINAL  Final  MRSA PCR Screening     Status: None   Collection Time: 01/10/20  9:39 PM   Specimen: Nasal Mucosa; Nasopharyngeal  Result Value Ref Range Status   MRSA by PCR NEGATIVE NEGATIVE Final    Comment:        The  GeneXpert MRSA Assay (FDA approved for NASAL specimens only), is one component of a comprehensive MRSA colonization surveillance program. It is not intended to diagnose MRSA infection nor to guide or monitor treatment for MRSA infections. Performed at [facility], [address], [place], [place] [address]     Radiology Reports DG Chest Portable 1 View  Result Date: 01/10/2020 CLINICAL DATA:  Fever EXAM: PORTABLE CHEST 1 VIEW COMPARISON:  05/23/2018 FINDINGS: Right chest wall Port-A-Cath with tip at the lower SVC. Hazy opacity at the lateral right lung base. Normal cardiomediastinal contours. No pleural effusion or pneumothorax. Shallow lung inflation. IMPRESSION: Hazy opacity at the lateral right lung base may indicate atelectasis or infection. Electronically Signed   By: Ulyses Jarred M.D.   On: 01/10/2020 17:59     CBC Recent Labs  Lab 01/08/20 0755 01/10/20 1616 01/11/20 0324 01/12/20 0910  WBC 11.1* 20.5* 22.7* 11.0*  HGB 11.8* 12.2 10.8* 10.8*  HCT 37.7 38.5 34.1* 33.7*  PLT 209 170 137* 115*  MCV 97.7 96.3 95.5 94.4  MCH 30.6 30.5 30.3 30.3  MCHC 31.3 31.7 31.7 32.0  RDW 15.8* 15.6* 15.1 14.9  LYMPHSABS 1.5 0.7  --   --   MONOABS 0.8 0.3  --   --   EOSABS 0.1 0.3  --   --   BASOSABS 0.1 0.1  --   --     Chemistries  Recent Labs  Lab 01/08/20 0755 01/10/20 1616 01/11/20 0324 01/12/20 0910  NA 140 135 141 133*  K 4.3 3.7 3.5 3.2*  CL 103 102 109 102  CO2 '27 24 22 23  ' GLUCOSE 98 139* 122* 107*  BUN '19 19 10 8  ' CREATININE 0.50 0.63 0.40* 0.45  CALCIUM 9.0 8.4* 8.0* 8.2*  MG 2.3  --   --   --   AST 23 23  --   --   ALT 13 13  --   --   ALKPHOS 110 86  --   --   BILITOT 0.5 0.8  --   --    ------------------------------------------------------------------------------------------------------------------ No results for input(s): CHOL, HDL, LDLCALC, TRIG, CHOLHDL, LDLDIRECT in the last 72 hours.  No results found for:  HGBA1C ------------------------------------------------------------------------------------------------------------------ No results for input(s): TSH, T4TOTAL, T3FREE, THYROIDAB in the last 72 hours.  Invalid input(s): FREET3 ------------------------------------------------------------------------------------------------------------------ No results for input(s): VITAMINB12, FOLATE, FERRITIN, TIBC, IRON, RETICCTPCT in the last 72 hours.  Coagulation profile Recent Labs  Lab 01/10/20 1616  INR 1.2    No results for input(s): DDIMER in the last 72 hours.  Cardiac Enzymes No results for input(s): CKMB, TROPONINI, MYOGLOBIN in the last 168 hours.  Invalid input(s): CK ------------------------------------------------------------------------------------------------------------------ No results found for: BNP   Roxan Hockey M.D on 01/12/2020 at 12:50 PM  Go to [email or web address] - for contact info  [facility] - Office  [phone]       \

## 2020-01-13 ENCOUNTER — Encounter (HOSPITAL_COMMUNITY): Payer: Self-pay | Admitting: Internal Medicine

## 2020-01-13 NOTE — Progress Notes (Signed)
Pharmacy Antibiotic Note  Today is day #4 of cefepime therapy for this 64 yo female with pneumonia and cellulitis. Blood and urine cultures have shown no growth  to date and WBC count has decreased significantly.   Patient is currently afebrile.  Renal function is likely over-estimated due to patient's low serum creatinine.   Plan: Vancomycin 500mg  IV every 12 hours.  Goal trough 15-20 mcg/mL. cefepime 2gm iv q12  Height: 5\' 2"  (157.5 cm) Weight: 65.2 kg (143 lb 11.8 oz) IBW/kg (Calculated) : 50.1  Temp (24hrs), Avg:99 F (37.2 C), Min:98.4 F (36.9 C), Max:99.5 F (37.5 C)  Recent Labs  Lab 01/08/20 0755 01/10/20 1616 01/10/20 1617 01/10/20 1810 01/10/20 2151 01/11/20 0324 01/12/20 0910  WBC 11.1* 20.5*  --   --   --  22.7* 11.0*  CREATININE 0.50 0.63  --   --   --  0.40* 0.45  LATICACIDVEN  --   --  2.5* 3.2* 1.1  --   --     Estimated Creatinine Clearance: 61.3 mL/min (by C-G formula based on SCr of 0.45 mg/dL).    No Known Allergies  5/20 metronidazole >>  5/20 cefepime >>  5/20 vancomycin >>5/21  Microbiology results: 5/20 BC x2: NG x3 days 5/21 MRSA PCR: neg 5/20 UCx: NGF   Thank you for allowing pharmacy to be a part of this patient's care.  Despina Pole 01/13/2020 2:38 PM

## 2020-01-13 NOTE — Progress Notes (Signed)
Patient Demographics:    [name] [name], is a 64 y.o. female, DOB - February 01, 1956, ZHY:[id number]  Admit date - 01/10/2020   Admitting Physician Ejiroghene Arlyce Dice, MD  Outpatient Primary MD for the patient is [name], [name], MD  LOS - 3  Chief Complaint  Patient presents with  . Fever       Subjective:    [name] [name] today has no fevers, no emesis,  No chest pain,    -Oral intake is fair -Remains pleasantly confused  Assessment  & Plan :    Principal Problem:   Septic shock [facility]) Active Problems:   Malignant neoplasm of overlapping sites of left female breast (HCC)   Breast mass, left  Brief Summary:- 64 y.o. female with medical history significant for left breast cancer with lung metastasis, dementia, depression admitted on 01/10/2020 with persistent hypotension and lactic acidosis in the setting of Severe sepsis with septic shock requiring IV Levophed -Weaned off Levophed drip on 01/12/2020   A/p 1)Septic shock--of unknown source  ???  Right-sided pneumonia Vs Lt Breast infection Pt Met severe sepsis with septic shock criteria on admission--on admission SBP was  73/48, remained in the 70s despite 3 L sepsis fluid bolus.  -Patient had persistent fevers, leukocytosis, tachycardia tachypnea --Weaned off Levophed drip on 01/12/2020 -Patient is immunocompromise due to ongoing chemotherapy last dose 01/08/2020 --As per DR. Delton Coombes-- She could not receive Neulasta on 5/20/21due to her febrile illness. -If the white count starts trending down with ANC below 1500, give her Neupogen 300 mcg daily while she is in the hospital. Please call me if any questions --Treated with IV vancomycin, cefepime and metronidazole -MRSA PCR negative Vanco discontinued 01/11/2020 -Continue cefepime for now -Stop Flagyl on 01/13/2020 -Repeat chest x-ray on 01/14/2020 -WBC down to 11k from  22.7,  --Blood and urine cultures  NGTD -Lactic acid down to 1.1 from 3.2 -If patient is found to be bacteremic right-sided chest Port-A-Cath will have to come out  2)Left breast mass with metastasis to lungs-follows with Dr. Delton Coombes.  Currently undergoing chemotherapy infusions with docetaxel. -Last chemo 01/08/2020 -Oncology consult appreciated -Left breast ultrasound confirmed already known malignancy, no abscess noted  3)Dementia, depression, history of psychotic disorder with delusions -At baseline moderate to severe cognitive and memory deficits with difficulty recognizing family members at times --PTA patient was on several psychoactive medications-including buspirone, Depakote, donepezil, Namenda, mirtazapine, Seroquel, and Zoloft.  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-- Pt is a DNR/DNI - POA (sister) wants to talk to Pt's daughter [name] ( who is an ultrasound tech in [employer] and just returned home from [place]--currently in [place]) --Continue full scope of treatment for now including pressors/IV Levophed   Code Status:  DNR Family Communication:   Discussed with POA--her Sister [name] Disposition Plan:  Most likely return to [facility] Consults called: Oncology consult,    Disposition/Need for in-Hospital Stay- patient unable to be discharged at this time due to --severe sepsis requiring  IV antibiotics -Patient From: [facility] SNF D/C Place: SNF Barriers: Not Clinically Stable-   DVT Prophylaxis  :  SCD/heparin   Lab Results  Component Value Date   PLT 115 (L) 01/12/2020    Inpatient Medications  Scheduled Meds: . Chlorhexidine Gluconate Cloth   6 each Topical Daily   Continuous Infusions: . ceFEPime (MAXIPIME) IV 2 g (01/13/20 1003)  . metronidazole 500 mg (01/13/20 1720)   PRN Meds:.acetaminophen **OR** acetaminophen, LORazepam, ondansetron **OR** ondansetron (ZOFRAN) IV, polyethylene glycol    Anti-infectives (From admission, onward)   Start     Dose/Rate Route Frequency Ordered Stop   01/11/20 0400  ceFEPIme (MAXIPIME) 2 g in sodium chloride 0.9 % 100 mL IVPB     2 g 200 mL/hr over 30 Minutes Intravenous Every 12 hours 01/10/20 1710     01/11/20 0300  vancomycin (VANCOREADY) IVPB 500 mg/100 mL  Status:  Discontinued     500 mg 100 mL/hr over 60 Minutes Intravenous Every 12 hours 01/10/20 1710 01/11/20 0959   01/10/20 1630  ceFEPIme (MAXIPIME) 2 g in sodium chloride 0.9 % 100 mL IVPB     2 g 200 mL/hr over 30 Minutes Intravenous  Once 01/10/20 1617 01/10/20 1714   01/10/20 1630  metroNIDAZOLE (FLAGYL) IVPB 500 mg     500 mg 100 mL/hr over 60 Minutes Intravenous Every 8 hours 01/10/20 1617 01/13/20 2300   01/10/20 1630  vancomycin (VANCOCIN) IVPB 1000 mg/200 mL premix     1,000 mg 200 mL/hr over 60 Minutes Intravenous  Once 01/10/20 1617 01/10/20 1751        Objective:   Vitals:   01/13/20 1400 01/13/20 1500 01/13/20 1600 01/13/20 1611  BP: (!) 89/68 (!) 89/67 (!) 101/50   Pulse: 85 91 94 88  Resp: '18 15 15 16  ' Temp:    99.1 F (37.3 C)  TempSrc:    Oral  SpO2: 98% 97% 98% 98%  Weight:      Height:        Wt Readings from Last 3 Encounters:  01/13/20 65.2 kg  01/08/20 61 kg  12/18/19 60.5 kg     Intake/Output Summary (Last 24 hours) at 01/13/2020 1740 Last data filed at 01/13/2020 1200 Gross per 24 hour  Intake 680 ml  Output 700 ml  Net -20 ml     Physical Exam  Gen:-In no acute distress, resting comfortably HEENT:- [place].AT, No sclera icterus Neck-Supple Neck,No JVD,.  Lungs-diminished in bases, no wheezing Breast--left breast redness mass--- please see photos in epic CV- S1, S2 normal,  regular , right-sided chest Port-A-Cath site is clean dry and intact Abd-  +ve B.Sounds, Abd Soft, No tenderness,    Extremity/Skin:- No  edema, pedal pulses present  Psych-significant memory and cognitive deficits at baseline  neuro-generalized weakness, no new focal deficits, no tremors   Data Review:   Micro Results Recent Results (from the past 240 hour(s))  Culture, blood (x 2)     Status: None (Preliminary result)   Collection Time: 01/10/20  4:16 PM   Specimen: BLOOD LEFT ARM  Result Value Ref Range Status   Specimen Description BLOOD LEFT ARM  Final   Special Requests   Final    BOTTLES DRAWN AEROBIC AND ANAEROBIC Blood Culture adequate volume   Culture   Final    NO GROWTH 3 DAYS Performed at [facility], [address] [place],  [address]    Report Status PENDING  Incomplete  Culture, blood (x 2)  Status: None (Preliminary result)   Collection Time: 01/10/20  4:22 PM   Specimen: BLOOD RIGHT ARM  Result Value Ref Range Status   Specimen Description BLOOD RIGHT ARM  Final   Special Requests   Final    BOTTLES DRAWN AEROBIC AND ANAEROBIC Blood Culture adequate volume   Culture   Final    NO GROWTH 3 DAYS Performed at [facility], [address] [place], [place] [address]    Report Status PENDING  Incomplete  SARS Coronavirus 2 by RT PCR (hospital order, performed in [place] hospital lab) Nasopharyngeal Nasopharyngeal Swab     Status: None   Collection Time: 01/10/20  7:47 PM   Specimen: Nasopharyngeal Swab  Result Value Ref Range Status   SARS Coronavirus 2 NEGATIVE NEGATIVE Final    Comment: (NOTE) SARS-CoV-2 target nucleic acids are NOT DETECTED. The SARS-CoV-2 RNA is generally detectable in upper and lower respiratory specimens during the acute phase of infection. The lowest concentration of SARS-CoV-2 viral copies this assay can detect is 250 copies / mL. A negative result does not preclude SARS-CoV-2 infection and should not be used as the sole  basis for treatment or other patient management decisions.  A negative result may occur with improper specimen collection / handling, submission of specimen other than nasopharyngeal swab, presence of viral mutation(s) within the areas targeted by this assay, and inadequate number of viral copies (<250 copies / mL). A negative result must be combined with clinical observations, patient history, and epidemiological information. Fact Sheet for Patients:   StrictlyIdeas.no Fact Sheet for Healthcare Providers: BankingDealers.co.za This test is not yet approved or cleared  by the [place] FDA and has been authorized for detection and/or diagnosis of SARS-CoV-2 by FDA under an Emergency Use Authorization (EUA).  This EUA will remain in effect (meaning this test can be used) for the duration of the COVID-19 declaration under Section 564(b)(1) of the Act, 21 U.S.C. section 360bbb-3(b)(1), unless the authorization is terminated or revoked sooner. Performed at [facility], [address], [place], [place] [address]   Culture, Urine     Status: None   Collection Time: 01/10/20  8:50 PM   Specimen: Urine, Random  Result Value Ref Range Status   Specimen Description   Final    URINE, RANDOM Performed at [facility], [name] [place], [place] [address]    Special Requests   Final    NONE Performed at [facility], [address], [place], [place] [address]    Culture   Final    NO GROWTH Performed at [facility], [place] [address], [place], [place] [address]    Report Status 01/12/2020 FINAL  Final  MRSA PCR Screening     Status: None   Collection Time: 01/10/20  9:39 PM   Specimen: Nasal Mucosa; Nasopharyngeal  Result Value Ref Range Status   MRSA by PCR NEGATIVE NEGATIVE Final    Comment:        The GeneXpert MRSA Assay (FDA approved for NASAL specimens only), is one component of a comprehensive MRSA  colonization surveillance program. It is not intended to diagnose MRSA infection nor to guide or monitor treatment for MRSA infections. Performed at [facility], [address] [place], [place] [address]     Radiology Reports DG Chest Portable 1 View  Result Date: 01/10/2020 CLINICAL DATA:  Fever EXAM: PORTABLE CHEST 1 VIEW COMPARISON:  05/23/2018 FINDINGS: Right chest wall Port-A-Cath with tip at the lower SVC. Hazy  opacity at the lateral right lung base. Normal cardiomediastinal contours. No pleural effusion or pneumothorax. Shallow lung inflation. IMPRESSION: Hazy opacity at the lateral right lung base may indicate atelectasis or infection. Electronically Signed   By: Ulyses Jarred M.D.   On: 01/10/2020 17:59   US BREAST LTD UNI LEFT INC AXILLA  Result Date: 01/12/2020 CLINICAL DATA:  Left breast mass with cellulitis. Rule out underlying abscess/fluid collection. EXAM: ULTRASOUND OF THE LEFT BREAST COMPARISON:  Mammography April 04, 2018 FINDINGS: Targeted ultrasound is performed, showing a large mass with internal blood flow a 12 o'clock, 2 cm from the nipple measuring 7 x 5.8 x 8.2 cm. No abscess identified. IMPRESSION: There is a large mass in the left breast at 12 o'clock, 2 cm from the nipple corresponding to the patient's known malignancy. No abscess. RECOMMENDATION: Recommend continued surgical and oncologic follow-up for the known malignancy. The cellulitis described in the history could be infectious. However, inflammatory breast cancer can mimic cellulitis/infection. A punch biopsy can differentiate if clinically warranted. I have discussed the findings and recommendations with the patient. If applicable, a reminder letter will be sent to the patient regarding the next appointment. BI-RADS CATEGORY  6: Known biopsy-proven malignancy. Electronically Signed   By: Dorise Bullion III M.D   On: 01/12/2020 13:40     CBC Recent Labs  Lab 01/08/20 0755 01/10/20 1616 01/11/20 0324  01/12/20 0910  WBC 11.1* 20.5* 22.7* 11.0*  HGB 11.8* 12.2 10.8* 10.8*  HCT 37.7 38.5 34.1* 33.7*  PLT 209 170 137* 115*  MCV 97.7 96.3 95.5 94.4  MCH 30.6 30.5 30.3 30.3  MCHC 31.3 31.7 31.7 32.0  RDW 15.8* 15.6* 15.1 14.9  LYMPHSABS 1.5 0.7  --   --   MONOABS 0.8 0.3  --   --   EOSABS 0.1 0.3  --   --   BASOSABS 0.1 0.1  --   --     Chemistries  Recent Labs  Lab 01/08/20 0755 01/10/20 1616 01/11/20 0324 01/12/20 0910  NA 140 135 141 133*  K 4.3 3.7 3.5 3.2*  CL 103 102 109 102  CO2 '27 24 22 23  ' GLUCOSE 98 139* 122* 107*  BUN '19 19 10 8  ' CREATININE 0.50 0.63 0.40* 0.45  CALCIUM 9.0 8.4* 8.0* 8.2*  MG 2.3  --   --   --   AST 23 23  --   --   ALT 13 13  --   --   ALKPHOS 110 86  --   --   BILITOT 0.5 0.8  --   --    ------------------------------------------------------------------------------------------------------------------ No results for input(s): CHOL, HDL, LDLCALC, TRIG, CHOLHDL, LDLDIRECT in the last 72 hours.  No results found for: HGBA1C ------------------------------------------------------------------------------------------------------------------ No results for input(s): TSH, T4TOTAL, T3FREE, THYROIDAB in the last 72 hours.  Invalid input(s): FREET3 ------------------------------------------------------------------------------------------------------------------ No results for input(s): VITAMINB12, FOLATE, FERRITIN, TIBC, IRON, RETICCTPCT in the last 72 hours.  Coagulation profile Recent Labs  Lab 01/10/20 1616  INR 1.2    No results for input(s): DDIMER in the last 72 hours.  Cardiac Enzymes No results for input(s): CKMB, TROPONINI, MYOGLOBIN in the last 168 hours.  Invalid input(s): CK ------------------------------------------------------------------------------------------------------------------ No results found for: BNP   Roxan Hockey M.D on 01/13/2020 at 5:40 PM  Go to [email or web address] - for contact info  [facility] -  Office  [phone]       \

## 2020-01-14 ENCOUNTER — Inpatient Hospital Stay (HOSPITAL_COMMUNITY): Payer: Medicare Other

## 2020-01-14 DIAGNOSIS — C50919 Malignant neoplasm of unspecified site of unspecified female breast: Secondary | ICD-10-CM

## 2020-01-14 LAB — BASIC METABOLIC PANEL
Anion gap: 7 (ref 5–15)
BUN: 13 mg/dL (ref 8–23)
CO2: 23 mmol/L (ref 22–32)
Calcium: 7.9 mg/dL — ABNORMAL LOW (ref 8.9–10.3)
Chloride: 106 mmol/L (ref 98–111)
Creatinine, Ser: 0.37 mg/dL — ABNORMAL LOW (ref 0.44–1.00)
GFR calc Af Amer: >60 mL/min (ref >60)
GFR calc non Af Amer: >60 mL/min (ref >60)
Glucose, Bld: 98 mg/dL (ref 70–99)
Potassium: 3.3 mmol/L — ABNORMAL LOW (ref 3.5–5.1)
Sodium: 136 mmol/L (ref 135–145)

## 2020-01-14 LAB — CBC
HCT: 27.4 % — ABNORMAL LOW (ref 36.0–46.0)
Hemoglobin: 8.6 g/dL — ABNORMAL LOW (ref 12.0–15.0)
MCH: 30.2 pg (ref 26.0–34.0)
MCHC: 31.4 g/dL (ref 30.0–36.0)
MCV: 96.1 fL (ref 80.0–100.0)
Platelets: 124 10*3/uL — ABNORMAL LOW (ref 150–400)
RBC: 2.85 MIL/uL — ABNORMAL LOW (ref 3.87–5.11)
RDW: 14.9 % (ref 11.5–15.5)
WBC: 1.1 10*3/uL — CL (ref 4.0–10.5)
nRBC: 0 % (ref 0.0–0.2)

## 2020-01-14 MED ORDER — BIOTENE DRY MOUTH MT LIQD: 15.0000 mL | OROMUCOSAL | Status: DC | PRN

## 2020-01-14 MED ORDER — MORPHINE SULFATE (CONCENTRATE) 10 MG/0.5ML PO SOLN: 5.0000 mg | ORAL | Status: DC | PRN

## 2020-01-14 MED ORDER — SODIUM CHLORIDE 0.9 % IV BOLUS
500.0000 mL | Freq: Once | INTRAVENOUS | Status: AC
  Administered 2020-01-14: 500 mL via INTRAVENOUS

## 2020-01-14 MED ORDER — GLYCOPYRROLATE 0.2 MG/ML IJ SOLN: 0.2000 mg | INTRAMUSCULAR | Status: DC | PRN

## 2020-01-14 MED ORDER — NOREPINEPHRINE 4 MG/250ML-% IV SOLN
0.0000 ug/min | INTRAVENOUS | Status: DC
  Administered 2020-01-14: 2 ug/min via INTRAVENOUS

## 2020-01-14 MED ORDER — HALOPERIDOL LACTATE 2 MG/ML PO CONC: 0.5000 mg | ORAL | Status: DC | PRN

## 2020-01-14 MED ORDER — TBO-FILGRASTIM 300 MCG/0.5ML ~~LOC~~ SOSY
300.0000 ug | PREFILLED_SYRINGE | Freq: Once | SUBCUTANEOUS | Status: AC
  Administered 2020-01-14: 300 ug via SUBCUTANEOUS
  Filled 2020-01-14: qty 0.5

## 2020-01-14 MED ORDER — POLYVINYL ALCOHOL 1.4 % OP SOLN: 1.0000 [drp] | Freq: Four times a day (QID) | OPHTHALMIC | Status: DC | PRN

## 2020-01-14 MED ORDER — HALOPERIDOL LACTATE 5 MG/ML IJ SOLN: 0.5000 mg | INTRAMUSCULAR | Status: DC | PRN

## 2020-01-14 MED ORDER — HALOPERIDOL 0.5 MG PO TABS: 0.5000 mg | ORAL_TABLET | ORAL | Status: DC | PRN

## 2020-01-14 MED ORDER — POTASSIUM CHLORIDE CRYS ER 20 MEQ PO TBCR
40.0000 meq | EXTENDED_RELEASE_TABLET | ORAL | Status: AC
  Administered 2020-01-14 (×2): 40 meq via ORAL
  Filled 2020-01-14 (×2): qty 2

## 2020-01-14 MED ORDER — GLYCOPYRROLATE 1 MG PO TABS: 1.0000 mg | ORAL_TABLET | ORAL | Status: DC | PRN

## 2020-01-14 MED ORDER — NOREPINEPHRINE 4 MG/250ML-% IV SOLN
INTRAVENOUS | Status: AC
  Filled 2020-01-14: qty 250

## 2020-01-14 NOTE — Progress Notes (Signed)
Patient Demographics:    [name] [name], is a 64 y.o. female, DOB - 05/23/1956, BWG:[id number]  Admit date - 01/10/2020   Admitting Physician Ejiroghene Arlyce Dice, MD  Outpatient Primary MD for the patient is [name], [name], MD  LOS - 4  Chief Complaint  Patient presents with  . Fever       Subjective:    [name] [name] today has no fevers, no emesis,  No chest pain,     -Had hypotension overnight required Levophed again -Oral intake is fair -[name]- she and [name] (daughter)  have decided they wish to transition [name] [name]'s care to full comfort measures only.    Assessment  & Plan :    Principal Problem:   Septic shock ([place]) Active Problems:   Malignant neoplasm of overlapping sites of left female breast ([place])   Breast mass, left  Brief Summary:- 64 y.o. female with medical history significant for left breast cancer with lung metastasis, dementia, depression admitted on 01/10/2020 with persistent hypotension and lactic acidosis in the setting of Severe sepsis with septic shock requiring IV Levophed -[name]- she and [name] (daughter) have decided they wish to transition [name] [name]'s care to full comfort measures only.    A/p 1)Septic shock--of unknown source  ???  Right-sided pneumonia Vs Lt Breast infection Pt Met severe sepsis with septic shock criteria on admission--on admission SBP was  73/48, remained in the 70s despite 3 L sepsis fluid bolus.  -Patient had persistent fevers, leukocytosis, tachycardia tachypnea --Weaned off Levophed drip on 01/12/2020 -Patient is immunocompromise due to ongoing chemotherapy last dose 01/08/2020 --As per DR. Delton Coombes-- She could not receive Neulasta on 5/20/21due to her febrile illness. -If the white count starts trending down with ANC below 1500, give her Neupogen 300 mcg daily while she is in the hospital. Please call me if any  questions --Treated with IV vancomycin, cefepime and metronidazole -MRSA PCR negative Vanco discontinued 01/11/2020 -Continue cefepime for now -Stop Flagyl on 01/13/2020 -Repeat chest x-ray on 01/14/2020 -WBC down to 11k from  22.7,  --Blood and urine cultures NGTD -Lactic acid down to 1.1 from 3.2 -Was restarted on Levophed overnight on 01/13/2028 due to persistent hypotension -At this time family request comfort care only  2)Left breast mass with metastasis to lungs-follows with Dr. Delton Coombes.  Currently undergoing chemotherapy infusions with docetaxel. -Last chemo 01/08/2020 -Oncology consult appreciated -Left breast ultrasound confirmed already known malignancy, no abscess noted -Comfort care only  3)Dementia, depression, history of psychotic disorder with delusions -At baseline moderate to severe cognitive and memory deficits with difficulty recognizing family members at times --PTA patient was on several psychoactive medications-including buspirone, Depakote, donepezil, Namenda, mirtazapine, Seroquel, and Zoloft.  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-- Pt is a DNR/DNI -  Pt's daughter [name] ( who is an ultrasound tech in [employer] and just returned home from [place]--currently in [place]) -[name]- she and [name] have decided they wish to transition [name]'s care to full comfort measures only.  -  Palliative care consult/input appreciated    Code Status:  DNR Family Communication:   Discussed with POA--[name]:  Residential hospice  consults called: Oncology consult/palliative care   Disposition/Need for in-Hospital Stay- patient unable to be discharged at this time due to --awaiting transfer to residential hospice -Patient From: [facility] SN[facility] D/C Place: Residential hospice  barriers: Bed availability at residential hospice  DVT Prophylaxis  :  SCD/heparin   Lab Results  Component Value Date   PLT 124 (L) 01/14/2020    Inpatient Medications  Scheduled Meds: . Chlorhexidine Gluconate Cloth  6 each Topical Daily   Continuous Infusions:  PRN Meds:.acetaminophen **OR** acetaminophen, antiseptic oral rinse, glycopyrrolate **OR** glycopyrrolate **OR** glycopyrrolate, haloperidol **OR** [DISCONTINUED] haloperidol **OR** haloperidol lactate, LORazepam, morphine CONCENTRATE **OR** morphine CONCENTRATE, ondansetron **OR** ondansetron (ZOFRAN) IV, polyethylene glycol, polyvinyl alcohol    Anti-infectives (From admission, onward)   Start     Dose/Rate Route Frequency Ordered Stop   01/11/20 0400  ceFEPIme (MAXIPIME) 2 g in sodium chloride 0.9 % 100 mL IVPB  Status:  Discontinued     2 g 200 mL/hr over 30 Minutes Intravenous Every 12 hours 01/10/20 1710 01/14/20 1403   01/11/20 0300  vancomycin (VANCOREADY) IVPB 500 mg/100 mL  Status:  Discontinued     500 mg 100 mL/hr over 60 Minutes Intravenous Every 12 hours 01/10/20 1710 01/11/20 0959   01/10/20 1630  ceFEPIme (MAXIPIME) 2 g in sodium chloride 0.9 % 100 mL IVPB     2 g 200 mL/hr over 30 Minutes Intravenous  Once 01/10/20 1617 01/10/20 1714   01/10/20 1630  metroNIDAZOLE (FLAGYL) IVPB 500 mg     500 mg 100 mL/hr over 60 Minutes Intravenous Every 8 hours 01/10/20 1617 01/13/20 1820   01/10/20 1630  vancomycin (VANCOCIN) IVPB 1000 mg/200 mL premix     1,000 mg 200 mL/hr over 60 Minutes Intravenous  Once 01/10/20 1617 01/10/20 1751        Objective:   Vitals:   01/14/20 1330 01/14/20 1345 01/14/20 1400 01/14/20 1415  BP: 133/60 (!) 128/54 (!) 155/60 (!) 155/68  Pulse: 70 71 69 64  Resp: _0 Temp:       TempSrc:      SpO2: 97% 97% 98% 96%  Weight:      Height:        Wt Readings from Last 3 Encounters:  01/13/20 65.2 kg  01/08/20 61 kg  12/18/19 60.5 kg     Intake/Output Summary (Last 24 hours) at 01/14/2020 1841 Last data filed at 01/14/2020 1700 Gross per 24 hour  Intake 265.27 ml  Output 1550 ml  Net -1284.73 ml     Physical Exam  Gen:-In no acute distress, resting comfortably HEENT:- [place].AT, No sclera icterus Neck-Supple Neck,No JVD,.  Lungs-diminished in bases, no wheezing Breast--left breast redness mass--- please see photos in epic CV- S1, S2 normal, regular , right-sided chest Port-A-Cath site is clean dry and intact Abd-  +ve B.Sounds, Abd Soft, No tenderness,    Extremity/Skin:- No  edema, pedal pulses present  Psych-significant memory and cognitive deficits at baseline  neuro-generalized weakness, no new focal deficits, no tremors   Data Review:   Micro Results Recent Results (from the past 240 hour(s))  Culture, blood (x 2)     Status: None (Preliminary result)   Collection Time: 01/10/20  4:16 PM   Specimen: BLOOD LEFT ARM  Result Value Ref Range Status   Specimen Description  BLOOD LEFT ARM  Final   Special Requests   Final    BOTTLES DRAWN AEROBIC AND ANAEROBIC Blood Culture adequate volume   Culture   Final    NO GROWTH 4 DAYS Performed at [facility], [address] [place], [place] [address]    Report Status PENDING  Incomplete  Culture, blood (x 2)     Status: None (Preliminary result)   Collection Time: 01/10/20  4:22 PM   Specimen: BLOOD RIGHT ARM  Result Value Ref Range Status   Specimen Description BLOOD RIGHT ARM  Final   Special Requests   Final    BOTTLES DRAWN AEROBIC AND ANAEROBIC Blood Culture adequate volume   Culture   Final    NO GROWTH 4 DAYS Performed at [facility], [address] [place], [place] [address]    Report Status PENDING  Incomplete  SARS Coronavirus 2 by RT PCR (hospital order, performed in [place]  hospital lab) Nasopharyngeal Nasopharyngeal Swab     Status: None   Collection Time: 01/10/20  7:47 PM   Specimen: Nasopharyngeal Swab  Result Value Ref Range Status   SARS Coronavirus 2 NEGATIVE NEGATIVE Final    Comment: (NOTE) SARS-CoV-2 target nucleic acids are NOT DETECTED. The SARS-CoV-2 RNA is generally detectable in upper and lower respiratory specimens during the acute phase of infection. The lowest concentration of SARS-CoV-2 viral copies this assay can detect is 250 copies / mL. A negative result does not preclude SARS-CoV-2 infection and should not be used as the sole basis for treatment or other patient management decisions.  A negative result may occur with improper specimen collection / handling, submission of specimen other than nasopharyngeal swab, presence of viral mutation(s) within the areas targeted by this assay, and inadequate number of viral copies (<250 copies / mL). A negative result must be combined with clinical observations, patient history, and epidemiological information. Fact Sheet for Patients:   StrictlyIdeas.no Fact Sheet for Healthcare Providers: BankingDealers.co.za This test is not yet approved or cleared  by the [place] FDA and has been authorized for detection and/or diagnosis of SARS-CoV-2 by FDA under an Emergency Use Authorization (EUA).  This EUA will remain in effect (meaning this test can be used) for the duration of the COVID-19 declaration under Section 564(b)(1) of the Act, 21 U.S.C. section 360bbb-3(b)(1), unless the authorization is terminated or revoked sooner. Performed at [facility], [address], [place], [place] [address]   Culture, Urine     Status: None   Collection Time: 01/10/20  8:50 PM   Specimen: Urine, Random  Result Value Ref Range Status   Specimen Description   Final    URINE, RANDOM Performed at [facility], [address], [place], [place] [address]     Special Requests   Final    NONE Performed at [facility], [address], [place], [place] [address]    Culture   Final    NO GROWTH Performed at [facility], [place] [address], [place], [place] [address]    Report Status 01/12/2020 FINAL  Final  MRSA PCR Screening     Status: None   Collection Time: 01/10/20  9:39 PM   Specimen: Nasal Mucosa; Nasopharyngeal  Result Value Ref Range Status   MRSA by PCR NEGATIVE NEGATIVE Final    Comment:        The GeneXpert MRSA Assay (FDA approved for NASAL specimens only), is one component of a comprehensive MRSA colonization surveillance program. It is  not intended to diagnose MRSA infection nor to guide or monitor treatment for MRSA infections. Performed at [facility], [address], [place], [place] [address]     Radiology Reports DG Chest [place] 1 View  Result Date: 01/14/2020 CLINICAL DATA:  Sepsis.  Metastatic breast cancer. EXAM: PORTABLE CHEST 1 VIEW COMPARISON:  Chest x-ray dated Jan 10, 2020. PET-CT dated November 22, 2019. FINDINGS: Unchanged right chest wall port catheter. The heart size and mediastinal contours are within normal limits. Normal pulmonary vascularity. Mild linear atelectasis in the lingula. Unchanged 3.7 cm mass in the peripheral right lower lobe. No focal consolidation, pleural effusion, or pneumothorax. No acute osseous abnormality. IMPRESSION: 1. No active disease. 2. Unchanged right lower lobe metastasis. Electronically Signed   By: Titus Dubin M.D.   On: 01/14/2020 15:01   DG Chest Portable 1 View  Result Date: 01/10/2020 CLINICAL DATA:  Fever EXAM: PORTABLE CHEST 1 VIEW COMPARISON:  05/23/2018 FINDINGS: Right chest wall Port-A-Cath with tip at the lower SVC. Hazy opacity at the lateral right lung base. Normal cardiomediastinal contours. No pleural effusion or pneumothorax. Shallow lung inflation. IMPRESSION: Hazy opacity at the lateral right lung base may indicate atelectasis or infection. Electronically  Signed   By: Ulyses Jarred M.D.   On: 01/10/2020 17:59   US BREAST LTD UNI LEFT INC AXILLA  Result Date: 01/12/2020 CLINICAL DATA:  Left breast mass with cellulitis. Rule out underlying abscess/fluid collection. EXAM: ULTRASOUND OF THE LEFT BREAST COMPARISON:  Mammography April 04, 2018 FINDINGS: Targeted ultrasound is performed, showing a large mass with internal blood flow a 12 o'clock, 2 cm from the nipple measuring 7 x 5.8 x 8.2 cm. No abscess identified. IMPRESSION: There is a large mass in the left breast at 12 o'clock, 2 cm from the nipple corresponding to the patient's known malignancy. No abscess. RECOMMENDATION: Recommend continued surgical and oncologic follow-up for the known malignancy. The cellulitis described in the history could be infectious. However, inflammatory breast cancer can mimic cellulitis/infection. A punch biopsy can differentiate if clinically warranted. I have discussed the findings and recommendations with the patient. If applicable, a reminder letter will be sent to the patient regarding the next appointment. BI-RADS CATEGORY  6: Known biopsy-proven malignancy. Electronically Signed   By: Dorise Bullion III M.D   On: 01/12/2020 13:40     CBC Recent Labs  Lab 01/08/20 0755 01/10/20 1616 01/11/20 0324 01/12/20 0910 01/14/20 0459  WBC 11.1* 20.5* 22.7* 11.0* 1.1*  HGB 11.8* 12.2 10.8* 10.8* 8.6*  HCT 37.7 38.5 34.1* 33.7* 27.4*  PLT 209 170 137* 115* 124*  MCV 97.7 96.3 95.5 94.4 96.1  MCH 30.6 30.5 30.3 30.3 30.2  MCHC 31.3 31.7 31.7 32.0 31.4  RDW 15.8* 15.6* 15.1 14.9 14.9  LYMPHSABS 1.5 0.7  --   --   --   MONOABS 0.8 0.3  --   --   --   EOSABS 0.1 0.3  --   --   --   BASOSABS 0.1 0.1  --   --   --     Chemistries  Recent Labs  Lab 01/08/20 0755 01/10/20 1616 01/11/20 0324 01/12/20 0910 01/14/20 0459  NA 140 135 141 133* 136  K 4.3 3.7 3.5 3.2* 3.3*  CL 103 102 109 102 106  CO2 _0 GLUCOSE 98 139* 122* 107* 98  BUN _1 CREATININE 0.50 0.63 0.40* 0.45 0.37*  CALCIUM 9.0 8.4* 8.0* 8.2*  7.9*  MG 2.3  --   --   --   --   AST 23 23  --   --   --   ALT 13 13  --   --   --   ALKPHOS 110 86  --   --   --   BILITOT 0.5 0.8  --   --   --    ------------------------------------------------------------------------------------------------------------------ No results for input(s): CHOL, HDL, LDLCALC, TRIG, CHOLHDL, LDLDIRECT in the last 72 hours.  No results found for: HGBA1C ------------------------------------------------------------------------------------------------------------------ No results for input(s): TSH, T4TOTAL, T3FREE, THYROIDAB in the last 72 hours.  Invalid input(s): FREET3 ------------------------------------------------------------------------------------------------------------------ No results for input(s): VITAMINB12, FOLATE, FERRITIN, TIBC, IRON, RETICCTPCT in the last 72 hours.  Coagulation profile Recent Labs  Lab 01/10/20 1616  INR 1.2    No results for input(s): DDIMER in the last 72 hours.  Cardiac Enzymes No results for input(s): CKMB, TROPONINI, MYOGLOBIN in the last 168 hours.  Invalid input(s): CK ------------------------------------------------------------------------------------------------------------------ No results found for: BNP   Roxan Hockey M.D on 01/14/2020 at 6:41 PM  Go to [email or web address] - for contact info  [facility] - Office  [phone]       \

## 2020-01-14 NOTE — Progress Notes (Signed)
Dr. Humphrey Rolls notified of b/p 66/23 MAP 37 HR 74 R18.

## 2020-01-14 NOTE — Progress Notes (Signed)
Dr. Humphrey Rolls notified of critical lab value:  WBC 1.1

## 2020-01-14 NOTE — Consult Note (Signed)
Consultation Note Date: 01/14/2020   Patient Name: [name] [name]  DOB: 1956/06/27  MRN: [id number]  Age / Sex: 64 y.o., female  PCP: [name] Nasuti, MD Referring Physician: Roxan Hockey, MD  Reason for Consultation: Establishing goals of care  HPI/Patient Profile: 64 y.o. female  with past medical history of Alzheimer's dementia, breast cancer with lung mets (most recently treated with docetaxel and herceptin on 5/18)  admitted on 01/10/2020 with septic shock possibly related to cellulitis from breast mass vs lung infiltrates. She had weaned of pressors, however, early this morning again became hypotensive and required restarting of levophed, she remains in the ICU. She has reached her [place] with WBC 1.1. Palliative medicine consulted for goals of care.   Clinical Assessment and Goals of Care: Evaluated patient she was lethargic, did not arouse. She appears frail and cachetic.  I called her sister [name] [name] and spoke after that with her daughter [name] [name].  I reviewed [name] [name]'s illness and current state with [name] [name].  [name] [name] discussed that [name] [name]'s quality of life had been significantly declining overall especially in the last few months. She has been living at the [facility]. She and [name] [name] had discussed that chemotherapy could potentially worsend her Alzheimer's and she believes this to be true. [name] [name] was nonambulatory, and at last visit did not recognize [name] [name].  Of note- [name] [name] was HCPOA for her mother who died from Alzheimer's. [name] [name] had been considering stopping chemotherapy treatments for [name] [name].  Continued aggressive measures vs comfort care and allowing for natural dying process were discussed.  We discussed that [name] [name] is in difficult state- very ill. Continued aggressive care could result in her survival and discharge back to [facility]- however, she would be even more debilitated and unlikely to  tolerate further chemotherapy treatments and would eventually die from her cancer.  We discussed what transition to comfort measures would look like. Stopping IV fluids, stopping antibiotics, providing medications for pain, anxiety, allowing for natural dying process.  [name] [name] noted that [name] would not want continued aggressive care that would only prolong her dying.    Primary Decision Maker HCPOA- patient's sister- [name] [name]    SUMMARY OF RECOMMENDATIONS -Continue current plan of care -Patient's daughter and sister are discussing possible de-escalation of care and transition to comfort measures only -If transitioned to comfort measures only I would not be surprised if patient survived only hours to days    Code Status/Advance Care Planning:  DNR  Discharge Planning: To Be Determined  Primary Diagnoses: Present on Admission: . Septic shock ([place]) . Malignant neoplasm of overlapping sites of left female breast ([place]) . Breast mass, left   I have reviewed the medical record, interviewed the patient and family, and examined the patient. The following aspects are pertinent.  Past Medical History:  Diagnosis Date  . Alzheimer's dementia ([place])   . Bradycardia   . Breast cancer ([place])   . Dementia ([place])   . Depression   . Hypokalemia   . Hypotension   . 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 Marital status: Widowed    Spouse name: Not on file  . Number of children: Not on file  . Years of education: Not on file  . Highest education level: Not on file  Occupational History  . Not on file  Tobacco Use  . Smoking status: Unknown If Ever Smoked  . Smokeless tobacco: Never Used  Substance and Sexual Activity  . Alcohol use: Not Currently  . Drug use: Never  . Sexual activity: Not Currently  Other Topics Concern  . Not on file  Social History Narrative   Lives at [facility] in [place] Strain:   . Difficulty of Paying Living Expenses:   Food Insecurity:   . Worried About Charity fundraiser in the Last Year:   . Arboriculturist in the Last Year:   Transportation Needs:   . Film/video editor (Medical):   Marland Kitchen Lack of Transportation (Non-Medical):   Physical Activity:   . Days of Exercise per Week:   . Minutes of Exercise per Session:   Stress:   . Feeling of Stress :   Social Connections:   . Frequency of Communication with Friends and Family:   . Frequency of Social Gatherings with Friends and Family:   . Attends Religious Services:   . Active Member of Clubs or Organizations:   . Attends Archivist Meetings:   Marland Kitchen Marital Status:    History reviewed. No pertinent family history. Scheduled Meds: . Chlorhexidine Gluconate Cloth  6 each Topical Daily  . [name] [name]  300 mcg Subcutaneous Once   Continuous Infusions: . ceFEPime (MAXIPIME) IV Stopped (01/14/20 0936)  . norepinephrine (LEVOPHED) Adult infusion 3 mcg/min (01/14/20 1209)   PRN Meds:.acetaminophen **OR** acetaminophen, LORazepam, ondansetron **OR** ondansetron (ZOFRAN) IV, polyethylene glycol Medications Prior to Admission:  Prior to Admission medications   Medication Sig Start Date End Date Taking? Authorizing Provider  acetaminophen (TYLENOL) 325 MG tablet Take 650 mg by mouth every 6 (six) hours as needed for mild pain.   Yes [provider]  ALPRAZolam Duanne Moron) 0.5 MG tablet Take 0.5 mg by mouth 2 (two) times daily.  01/12/19  Yes [provider]  busPIRone (BUSPAR) 5 MG tablet Take 5 mg by mouth 2 (two) times daily.   Yes [provider]  divalproex (DEPAKOTE) 250 MG DR tablet Take 250 mg by mouth at bedtime.  12/02/18  Yes [provider]  divalproex (DEPAKOTE) 500 MG DR tablet Take 500 mg by mouth every morning.  02/09/19  Yes [provider]  docusate sodium (COLACE) 100 MG capsule  Take 200 mg by mouth daily. May increase to 2 capsules in the am and 2 capsules in the pm. If no BM, call the [place].   Yes [provider]  donepezil (ARICEPT) 10 MG tablet Take 10 mg by mouth at bedtime.  12/07/18  Yes [provider]  lidocaine-prilocaine (EMLA) cream Apply 1 application topically as needed (apply to port site topically every 21 days).   Yes [provider]  memantine (NAMENDA) 10 MG tablet Take 10 mg by mouth 2 (two) times daily.   Yes [provider]  mirtazapine (REMERON) 7.5 MG tablet Take 7.5 mg by mouth at bedtime.   Yes [provider]  ondansetron (ZOFRAN) 4 MG tablet Take 1 tablet (4 mg total) by  mouth every 8 (eight) hours as needed for nausea or vomiting. 06/12/19  Yes Lockamy, Randi L, NP-C  QUEtiapine (SEROQUEL) 25 MG tablet Take 12.5 mg by mouth 3 (three) times daily.    Yes [provider]  sertraline (ZOLOFT) 100 MG tablet Take 100 mg by mouth daily.    Yes [provider]  Vitamin D, Cholecalciferol, 50 MCG (2000 UT) CAPS Take 2,000 Units by mouth daily.    Yes [provider]  prochlorperazine (COMPAZINE) 10 MG tablet Take 1 tablet (10 mg total) by mouth every 6 (six) hours as needed (Nausea or vomiting). 05/30/18 01/18/19  Derek Jack, MD   No Known Allergies Review of Systems  Unable to perform ROS   Physical Exam Vitals and nursing note reviewed.  Constitutional:      Appearance: She is ill-appearing.  HENT:     Head: Normocephalic and atraumatic.  Skin:    Coloration: Skin is pale.  Neurological:     Mental Status: She is disoriented.     Comments: lethargic     Vital Signs: BP (!) 153/60   Pulse 73   Temp 98 F (36.7 C) (Oral)   Resp 17   Ht 5\' 2"  (1.575 m)   Wt 65.2 kg   SpO2 98%   BMI 26.29 kg/m  Pain Scale: 0-10   Pain Score: 0-No pain   SpO2: SpO2: 98 % O2 Device:SpO2: 98 % O2 Flow Rate: .   IO: Intake/output summary:   Intake/Output  Summary (Last 24 hours) at 01/14/2020 1325 Last data filed at 01/14/2020 1209 Gross per 24 hour  Intake 265.27 ml  Output 400 ml  Net -134.73 ml    LBM: Last BM Date: (PTA) Baseline Weight: Weight: 61 kg Most recent weight: Weight: 65.2 kg     Palliative Assessment/Data: PPS: 30%     Thank you for this consult. Palliative medicine will continue to follow and assist as needed.   Time In: 1230 Time Out: 1350 Time Total: 80 mins Greater than 50%  of this time was spent counseling and coordinating care related to the above assessment and plan.  Signed by: Mariana Kaufman, AGNP-C Palliative Medicine    Please contact Palliative Medicine Team phone at [phone] for questions and concerns.  For individual provider: See Shea Evans

## 2020-01-14 NOTE — Care Management Important Message (Signed)
Important Message  Patient Details  Name: [name] [name] MRN: [id number] Date of Birth: August 06, 1956   Medicare Important Message Given:  Yes     Tommy Medal 01/14/2020, 4:18 PM

## 2020-01-14 NOTE — Progress Notes (Signed)
Addendum to previous note:   Received call from [name]- she and [name] have decided they wish to transition [name] [name]'s care to full comfort measures only.  Comfort orders have been entered. I contacted Dr. Denton Brick and Dr. Delton Coombes to let them know as well.   Mariana Kaufman, AGNP-C Palliative Medicine  Please call Palliative Medicine team phone with any questions [phone]. For individual providers please see AMION.  No charge

## 2020-01-14 NOTE — Progress Notes (Signed)
Dr. Chancy Milroy notified of b/p 90/37 MAP 51.

## 2020-01-15 DIAGNOSIS — Z515 Encounter for palliative care: Secondary | ICD-10-CM

## 2020-01-15 DIAGNOSIS — N61 Mastitis without abscess: Secondary | ICD-10-CM

## 2020-01-15 DIAGNOSIS — Z7189 Other specified counseling: Secondary | ICD-10-CM

## 2020-01-15 LAB — CULTURE, BLOOD (ROUTINE X 2)
Culture: NO GROWTH
Culture: NO GROWTH
Special Requests: ADEQUATE
Special Requests: ADEQUATE

## 2020-01-15 MED ORDER — POLYVINYL ALCOHOL 1.4 % OP SOLN: 1.0000 [drp] | Freq: Four times a day (QID) | OPHTHALMIC | 0 refills | Status: AC | PRN

## 2020-01-15 MED ORDER — LORAZEPAM 2 MG/ML PO CONC: 1.0000 mg | Freq: Three times a day (TID) | ORAL | 0 refills | Status: AC | PRN

## 2020-01-15 MED ORDER — HALOPERIDOL 0.5 MG PO TABS: 0.5000 mg | ORAL_TABLET | ORAL | 0 refills | Status: AC | PRN

## 2020-01-15 MED ORDER — HEPARIN SOD (PORK) LOCK FLUSH 100 UNIT/ML IV SOLN
500.0000 [IU] | Freq: Once | INTRAVENOUS | Status: AC
  Administered 2020-01-15: 500 [IU]
  Filled 2020-01-15: qty 5

## 2020-01-15 MED ORDER — BISACODYL 10 MG RE SUPP: 10.0000 mg | RECTAL | 0 refills | Status: AC | PRN

## 2020-01-15 MED ORDER — ACETAMINOPHEN 325 MG PO TABS: 650.0000 mg | ORAL_TABLET | Freq: Four times a day (QID) | ORAL | 0 refills | Status: AC | PRN

## 2020-01-15 MED ORDER — POLYETHYLENE GLYCOL 3350 17 G PO PACK: 17.0000 g | PACK | Freq: Every day | ORAL | 0 refills | Status: AC | PRN

## 2020-01-15 MED ORDER — ONDANSETRON 4 MG PO TBDP: 4.0000 mg | ORAL_TABLET | Freq: Three times a day (TID) | ORAL | 0 refills | Status: AC | PRN

## 2020-01-15 MED ORDER — MORPHINE SULFATE (CONCENTRATE) 10 MG/0.5ML PO SOLN: 5.0000 mg | ORAL | 0 refills | Status: AC | PRN

## 2020-01-15 MED ORDER — GLYCOPYRROLATE 1 MG PO TABS: 1.0000 mg | ORAL_TABLET | ORAL | 0 refills | Status: AC | PRN

## 2020-01-15 MED ORDER — BIOTENE DRY MOUTH MT LIQD: 15.0000 mL | OROMUCOSAL | 0 refills | Status: AC | PRN

## 2020-01-15 NOTE — Progress Notes (Signed)
Daily Progress Note   Patient Name: [name] [name]       Date: 01/15/2020 DOB: 23-Sep-1955  Age: 64 y.o. MRN#: [id number] Attending Physician: Roxan Hockey, MD Primary Care Physician: Bonnita Nasuti, MD Admit Date: 01/10/2020  Reason for Consultation/Follow-up: Establishing goals of care  Subjective: Patient is awake and alert. Sitting up, eating breakfast. Tells me she feels good this morning. No complaints of pain or other.  I spoke with [name] [name]. [name] [name] has spoken with [name] and they informed her they can provide Hospice care with [facility] hosp[facility]. [name] [name] feels this would be best for [name] [name]- it is familiar surroundings, it is home for [name] [name], and they will take best care of her.  Review of Systems  Unable to perform ROS: Dementia    Length of Stay: 5  Current Medications: Scheduled Meds:  . Chlorhexidine Gluconate Cloth  6 each Topical Daily    Continuous Infusions:   PRN Meds: acetaminophen **OR** acetaminophen, antiseptic oral rinse, glycopyrrolate **OR** glycopyrrolate **OR** glycopyrrolate, haloperidol **OR** [DISCONTINUED] haloperidol **OR** haloperidol lactate, LORazepam, morphine CONCENTRATE **OR** morphine CONCENTRATE, ondansetron **OR** ondansetron (ZOFRAN) IV, polyethylene glycol, polyvinyl alcohol  Physical Exam Vitals and nursing note reviewed.  Cardiovascular:     Rate and Rhythm: Normal rate.     Pulses: Normal pulses.  Pulmonary:     Effort: Pulmonary effort is normal.  Skin:    Coloration: Skin is pale.  Neurological:     Mental Status: She is alert. She is disoriented.  Psychiatric:     Comments: Pleasantly confused             Vital Signs: BP (!) 99/51 (BP Location: Right Arm)   Pulse 81   Temp 98.7 F (37.1 C) (Oral)   Resp 18   Ht _0   (1.575 m)   Wt 65.2 kg   SpO2 95%   BMI 26.29 kg/m  SpO2: SpO2: 95 % O2 Device: O2 Device: Room Air O2 Flow Rate:    Intake/output summary:   Intake/Output Summary (Last 24 hours) at 01/15/2020 0930 Last data filed at 01/14/2020 1700 Gross per 24 hour  Intake 157.87 ml  Output 1150 ml  Net -992.13 ml   LBM: Last BM Date: 01/13/20 Baseline Weight: Weight: 61 kg Most recent weight: Weight: 65.2 kg       Palliative Assessment/Data:  PPS: 30%      Patient Active Problem List   Diagnosis Date Noted  . Breast mass, left   . Septic shock ([place]) 01/10/2020  . Dehydration 11/06/2019  . Goals of care, counseling/discussion 05/30/2018  . HER2-positive carcinoma of left breast ([place]) 05/30/2018  . Malignant neoplasm of overlapping sites of left female breast [facility])     Palliative Care Assessment & Plan   Patient Profile:  64 y.o. female  with past medical history of Alzheimer's dementia, breast cancer with lung mets (most recently treated with docetaxel and herceptin on 5/18)  admitted on 01/10/2020 with septic shock possibly related to cellulitis from breast mass vs lung infiltrates. She had weaned of pressors, however, early this morning again became hypotensive and required restarting of levophed, she remains in the ICU. She is reaching her Nadir with WBC 1.1. Palliative medicine consulted for goals of care.   Assessment/Recommendations/Plan   Transitioned to full comfort yesterday- IV pressors and antibiotics stopped- having a little rally this morning, discussed with [name] [name]- goals remain full comfort only  Transition of care consult- plan for discharge to [facility] with hospice  Goals of Care and Additional Recommendations:  Limitations on Scope of Treatment: Full Comfort Care  Code Status:  DNR  Prognosis:   < 2 weeks in the setting of sepsis, stage IV breast cancer, transition to full comfort measures only  Discharge Planning:  [place] with  Hospice  Care plan was discussed with [name] [name] (patient's HCPOA)   Thank you for allowing the Palliative Medicine Team to assist in the care of this patient.   Time In: 0855 Time Out: 0930 Total Time 35 mins Prolonged Time Billed no      Greater than 50%  of this time was spent counseling and coordinating care related to the above assessment and plan.  [name] [name], AGNP-C Palliative Medicine   Please contact Palliative Medicine Team phone at [phone] for questions and concerns.

## 2020-01-15 NOTE — Discharge Summary (Signed)
[name] [name], is a 64 y.o. female  DOB 03-29-1956  MRN [id number].  Admission date:  01/10/2020  Admitting Physician  Bethena Roys, MD  Discharge Date:  01/15/2020   Primary MD  Bonnita Nasuti, MD  Recommendations for primary [name] physician for things to follow:   Discharge back to [facility] with hospice [name]/comfort measures--- DNR [name] [name]  Admission Diagnosis  Cellulitis of breast [N61.0] Breast mass, left [N63.20] Septic shock ([place]) [A41.9, R65.21] Sepsis, due to unspecified organism, unspecified whether acute organ dysfunction present [facility]) [A41.9]   Discharge Diagnosis  Cellulitis of breast [N61.0] Breast mass, left [N63.20] Septic shock ([place]) [A41.9, R65.21] Sepsis, due to unspecified organism, unspecified whether acute organ dysfunction present [facility]) [A41.9]    Principal Problem:   Septic shock ([place]) Active Problems:   Malignant neoplasm of overlapping sites of left female breast ([place])   Breast mass, left   Cellulitis of breast   Advanced [name] planning/counseling discussion   Palliative [name] by specialist      Past Medical History:  Diagnosis Date  . Alzheimer's dementia ([place])   . Bradycardia   . Breast cancer ([place])   . Dementia ([place])   . Depression   . Hypokalemia   . Hypotension   . 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 ABDOMINAL HYSTERECTOMY    . BREAST BIOPSY Left   . PORTACATH PLACEMENT Right 05/15/2018   Procedure: INSERTION PORT-A-CATH;  Surgeon: Aviva Signs, MD;  Location: [facility];  Service: General;  Laterality: Right;      HPI  from the history and physical done on the day of admission:    Chief Complaint: Fever  HPI: [name] [name] is a 64 y.o. female with medical history significant for left breast cancer with lung metastasis, dementia, depression.  Patient  presented to the ED with reports of fever 101.  She denies cough or difficulty breathing, she denies pain with urination.  No vomiting no loose stools.  She reports redness and swelling to her left breast.  ED Course: T-max 103.1, tachycardic to 132, blood pressure systolic down to 63/87, 3 L sepsis fluid bolus given with persistent hypotension.  WBC 20.5.  Increasing lactic acidosis 2.5 >> 3.2.  Portable chest x-ray shows hazy opacity at the lateral right lung base which may indicate atelectasis or infection.  Blood cultures obtained, patient was started on broad-spectrum antibiotics IV Vanco cefepime and metronidazole.  Hospitalist admit for sepsis from left breast mass with cellulitis.  Review of Systems: As per HPI all other systems reviewed and negative.    Hospital Course:   Brief Summary:- 64 y.o.femalewith medical history significant forleft breast cancer with lung metastasis,dementia, depression admitted on 01/10/2020 with persistent hypotension and lactic acidosis in the setting of Severe sepsis with septic shock requiring IV Levophed -[name]- and [name] [name] (daughter) have decided they wish to transition [name] [name] to full comfort measures only.    A/p 1)Septic shock--of  unknown source  ???  Right-sided pneumonia Vs Lt Breast infection Pt Met severe sepsis with septic shock criteria on admission--on admission SBP was  73/48, remained in the 70s despite 3Lsepsis fluid bolus. -Patient had persistent fevers, leukocytosis, tachycardia tachypnea --Weaned off Levophed drip on 01/12/2020 -Patient is immunocompromise due to ongoing chemotherapy last dose 01/08/2020 --As per DR. Delton Coombes-- She could not receive Neulasta on 5/20/21due to her febrile illness. -If the white count starts trending down with ANC below 1500, give her Neupogen 300 mcg daily while she is in the hospital. Please callme ifany questions --Treated with IV vancomycin, cefepime and  metronidazole -MRSA PCR negative Vanco discontinued 01/11/2020 -Continue cefepime for now -Stop Flagyl on 01/13/2020 -Repeat chest x-ray on 01/14/2020 -WBC down to 11k from  22.7,  --Blood and urine cultures NGTD -Lactic acid down to 1.1 from 3.2 -Was restarted on Levophed overnight on 01/13/2028 due to persistent hypotension -At this time family request comfort [name] only  2)Left breast mass with metastasis to lungs-follows with Dr. Delton Coombes. Currently undergoing chemotherapy infusions with docetaxel. -Last chemo 01/08/2020 -Oncology consult appreciated -Left breast ultrasound confirmed already known malignancy, no abscess noted -Comfort [name] only  3)Dementia,depression,history of psychotic disorder with delusions -At baseline moderate to severe cognitive and memory deficits with difficulty recognizing family members at times --PTA patient was on several psychoactive medications-includingbuspirone, Depakote, donepezil, Namenda, mirtazapine, Seroquel, andZoloft.  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-- Pt is a DNR/DNI -  Pt's daughter [name] [name] ( who is an ultrasound tech in [employer] and just returned home from [place]--currently in [place]) -[name]-  and [name] [name] (daughter)  have decided they wish to transition [name] [name] to full comfort measures only.  -Palliative [name] consult/input appreciated    Code Status: DNR Family Communication:  Discussed with POA--[name]:  Discharge back [name] with hospice [name]/comfort measures--- DNR  [name] [name] consults called:Oncology consult/palliative [name]  [place] back to [facility] SNF with hospice [name]/comfort measures--- DNR [name] [name] Discharge Condition: [place] prognosis  Follow UP--hospice team at facility  Diet and Activity recommendation:  As advised  Discharge Instructions    Discharge Instructions    Call MD for:  difficulty breathing, headache or visual disturbances   Complete by: As directed    Call MD for:  persistant dizziness or light-headedness   Complete by: As directed    Call MD for:  persistant nausea and vomiting   Complete by: As directed    Call MD for:  severe uncontrolled pain   Complete by: As directed    Call MD for:  temperature >100.4   Complete by: As directed    Diet general   Complete by: As directed    Discharge instructions   Complete by: As directed    Discharge back to [facility] with hospice [name]/comfort measures--- DNR [name] [name]   Increase activity slowly   Complete by: As directed       Discharge Medications     Allergies as of 01/15/2020   No Known Allergies     Medication List    STOP taking these medications   ALPRAZolam 0.5 MG tablet Commonly known as: XANAX   busPIRone 5 MG tablet Commonly  known as: BUSPAR   divalproex 250 MG DR tablet Commonly known as: DEPAKOTE   divalproex 500 MG DR tablet Commonly known as: DEPAKOTE   docusate sodium 100 MG capsule Commonly known as: COLACE   donepezil 10 MG tablet Commonly known as: ARICEPT   lidocaine-prilocaine cream Commonly known as: EMLA   memantine 10 MG tablet Commonly known as: NAMENDA   mirtazapine 7.5 MG tablet Commonly known as: REMERON   ondansetron 4 MG tablet Commonly known as: ZOFRAN   SEROquel 25 MG tablet Generic drug: QUEtiapine   sertraline 100 MG tablet Commonly known as: ZOLOFT   Vitamin D (Cholecalciferol) 50 MCG (2000 UT) Caps     TAKE these medications   acetaminophen 325 MG tablet Commonly known as: TYLENOL Take 2  tablets (650 mg total) by mouth every 6 (six) hours as needed for mild pain, fever or headache (or Fever >/= 101). What changed: reasons to take this   antiseptic oral rinse Liqd Apply 15 mLs topically as needed for dry mouth or mouth pain.   bisacodyl 10 MG suppository Commonly known as: Dulcolax Place 1 suppository (10 mg total) rectally as needed for moderate constipation.   glycopyrrolate 1 MG tablet Commonly known as: ROBINUL Take 1 tablet (1 mg total) by mouth every 4 (four) hours as needed (excessive secretions).   haloperidol 0.5 MG tablet Commonly known as: HALDOL Take 1 tablet (0.5 mg total) by mouth every 4 (four) hours as needed for agitation (or delirium).   LORazepam 2 MG/ML concentrated solution Commonly known as: ATIVAN Take 0.5 mLs (1 mg total) by mouth every 8 (eight) hours as needed for anxiety, seizure or sleep.   morphine CONCENTRATE 10 MG/0.5ML Soln concentrated solution Take 0.25 mLs (5 mg total) by mouth every 4 (four) hours as needed for moderate pain, severe pain, anxiety or shortness of breath (or dyspnea).   ondansetron 4 MG disintegrating tablet Commonly known as: Zofran ODT Take 1 tablet (4 mg total) by mouth every 8 (eight) hours as needed for nausea or vomiting.   polyethylene glycol 17 g packet Commonly known as: MIRALAX / GLYCOLAX Take 17 g by mouth daily as needed for mild constipation.   polyvinyl alcohol 1.4 % ophthalmic solution Commonly known as: LIQUIFILM TEARS Place 1 drop into both eyes 4 (four) times daily as needed for dry eyes.       Major procedures and Radiology Reports - PLEASE review detailed and final reports for all details, in brief -   DG Chest Port 1 View  Result Date: 01/14/2020 CLINICAL DATA:  Sepsis.  Metastatic breast cancer. EXAM: PORTABLE CHEST 1 VIEW COMPARISON:  Chest x-ray dated Jan 10, 2020. PET-CT dated November 22, 2019. FINDINGS: Unchanged right chest wall port catheter. The heart size and mediastinal contours  are within normal limits. Normal pulmonary vascularity. Mild linear atelectasis in the lingula. Unchanged 3.7 cm mass in the peripheral right lower lobe. No focal consolidation, pleural effusion, or pneumothorax. No acute osseous abnormality. IMPRESSION: 1. No active disease. 2. Unchanged right lower lobe metastasis. Electronically Signed   By: Titus Dubin M.D.   On: 01/14/2020 15:01   DG Chest Portable 1 View  Result Date: 01/10/2020 CLINICAL DATA:  Fever EXAM: PORTABLE CHEST 1 VIEW COMPARISON:  05/23/2018 FINDINGS: Right chest wall Port-A-Cath with tip at the lower SVC. Hazy opacity at the lateral right lung base. Normal cardiomediastinal contours. No pleural effusion or pneumothorax. Shallow lung inflation. IMPRESSION: Hazy opacity at the lateral right lung base may indicate  atelectasis or infection. Electronically Signed   By: Ulyses Jarred M.D.   On: 01/10/2020 17:59   US BREAST LTD UNI LEFT INC AXILLA  Result Date: 01/12/2020 CLINICAL DATA:  Left breast mass with cellulitis. Rule out underlying abscess/fluid collection. EXAM: ULTRASOUND OF THE LEFT BREAST COMPARISON:  Mammography April 04, 2018 FINDINGS: Targeted ultrasound is performed, showing a large mass with internal blood flow a 12 o'clock, 2 cm from the nipple measuring 7 x 5.8 x 8.2 cm. No abscess identified. IMPRESSION: There is a large mass in the left breast at 12 o'clock, 2 cm from the nipple corresponding to the patient's known malignancy. No abscess. RECOMMENDATION: Recommend continued surgical and oncologic follow-up for the known malignancy. The cellulitis described in the history could be infectious. However, inflammatory breast cancer can mimic cellulitis/infection. A punch biopsy can differentiate if clinically warranted. I have discussed the findings and recommendations with the patient. If applicable, a reminder letter will be sent to the patient regarding the next appointment. BI-RADS CATEGORY  6: Known biopsy-proven  malignancy. Electronically Signed   By: Dorise Bullion III M.D   On: 01/12/2020 13:40    Micro Results   Recent Results (from the past 240 hour(s))  Culture, blood (x 2)     Status: None   Collection Time: 01/10/20  4:16 PM   Specimen: BLOOD LEFT ARM  Result Value Ref Range Status   Specimen Description BLOOD LEFT ARM  Final   Special Requests   Final    BOTTLES DRAWN AEROBIC AND ANAEROBIC Blood Culture adequate volume   Culture   Final    NO GROWTH 5 DAYS Performed at [facility], [address], [place], [place] [address]    Report Status 01/15/2020 FINAL  Final  Culture, blood (x 2)     Status: None   Collection Time: 01/10/20  4:22 PM   Specimen: BLOOD RIGHT ARM  Result Value Ref Range Status   Specimen Description BLOOD RIGHT ARM  Final   Special Requests   Final    BOTTLES DRAWN AEROBIC AND ANAEROBIC Blood Culture adequate volume   Culture   Final    NO GROWTH 5 DAYS Performed at [facility], [address] [place], [place] [address]    Report Status 01/15/2020 FINAL  Final  SARS Coronavirus 2 by RT PCR (hospital order, performed in [place] hospital lab) Nasopharyngeal Nasopharyngeal Swab     Status: None   Collection Time: 01/10/20  7:47 PM   Specimen: Nasopharyngeal Swab  Result Value Ref Range Status   SARS Coronavirus 2 NEGATIVE NEGATIVE Final    Comment: (NOTE) SARS-CoV-2 target nucleic acids are NOT DETECTED. The SARS-CoV-2 RNA is generally detectable in upper and lower respiratory specimens during the acute phase of infection. The lowest concentration of SARS-CoV-2 viral copies this assay can detect is 250 copies / mL. A negative result does not preclude SARS-CoV-2 infection and should not be used as the sole basis for treatment or other patient management decisions.  A negative result may occur with improper specimen collection / handling, submission of specimen other than nasopharyngeal swab, presence of viral mutation(s) within the areas targeted by  this assay, and inadequate number of viral copies (<250 copies / mL). A negative result must be combined with clinical observations, patient history, and epidemiological information. Fact Sheet for Patients:   StrictlyIdeas.no Fact Sheet for Healthcare Providers: BankingDealers.co.za This test is not yet approved or cleared  by the [place] FDA and has been authorized  for detection and/or diagnosis of SARS-CoV-2 by FDA under an Emergency Use Authorization (EUA).  This EUA will remain in effect (meaning this test can be used) for the duration of the COVID-19 declaration under Section 564(b)(1) of the Act, 21 U.S.C. section 360bbb-3(b)(1), unless the authorization is terminated or revoked sooner. Performed at [facility], [address], [place], [place] [address]   Culture, Urine     Status: None   Collection Time: 01/10/20  8:50 PM   Specimen: Urine, Random  Result Value Ref Range Status   Specimen Description   Final    URINE, RANDOM Performed at [facility], [address], [place], [place] [address]    Special Requests   Final    NONE Performed at [facility], [address], [place], [place] [address]    Culture   Final    NO GROWTH Performed at [facility], [place] [address], [place], [place] [address]    Report Status 01/12/2020 FINAL  Final  MRSA PCR Screening     Status: None   Collection Time: 01/10/20  9:39 PM   Specimen: Nasal Mucosa; Nasopharyngeal  Result Value Ref Range Status   MRSA by PCR NEGATIVE NEGATIVE Final    Comment:        The GeneXpert MRSA Assay (FDA approved for NASAL specimens only), is one component of a comprehensive MRSA colonization surveillance program. It is not intended to diagnose MRSA infection nor to guide or monitor treatment for MRSA infections. Performed at [facility], [address], [place], [place] [address]     Today   Subjective    [name] [name] today has no new  complaints, -Oral intake is fair -Discharge back to [facility] with hospice [name]/comfort measures--- DNR [name] [name]      Patient has been seen and examined prior to discharge   Objective   Blood pressure (!) 99/51, pulse 81, temperature 98.7 F (37.1 C), temperature source Oral, resp. rate 18, height 5' 2" (1.575 m), weight 65.2 kg, SpO2 95 %.   Intake/Output Summary (Last 24 hours) at 01/15/2020 1231 Last data filed at 01/15/2020 1138 Gross per 24 hour  Intake --  Output 1700 ml  Net -1700 ml    Exam Gen:-In no acute distress, resting comfortably HEENT:- [place].AT, No sclera icterus Neck-Supple Neck,No JVD,.  Lungs-improved air movement, no wheezing Breast--left breast redness mass--- please see photos in epic CV- S1, S2 normal, regular , right-sided chest Port-A-Cath site is clean dry and intact Abd-  +ve B.Sounds, Abd Soft, No tenderness,    Extremity/Skin:- No  edema, pedal pulses present  Psych-significant memory and cognitive deficits at baseline  neuro-generalized weakness, no new focal deficits, no tremors   Data Review   CBC w Diff:  Lab Results  Component Value Date   WBC 1.1 (LL) 01/14/2020   HGB 8.6 (L) 01/14/2020   HCT 27.4 (L) 01/14/2020   PLT 124 (L) 01/14/2020   LYMPHOPCT 3 01/10/2020   BANDSPCT 8 12/06/2019   MONOPCT 1 01/10/2020   EOSPCT 2 01/10/2020   BASOPCT 0 01/10/2020    CMP:  Lab Results  Component Value Date   NA 136 01/14/2020   K 3.3 (L) 01/14/2020   CL 106 01/14/2020   CO2 23 01/14/2020   BUN 13 01/14/2020   CREATININE 0.37 (L) 01/14/2020   PROT 7.2 01/10/2020   ALBUMIN 3.4 (L) 01/10/2020   BILITOT 0.8 01/10/2020   ALKPHOS 86 01/10/2020   AST 23 01/10/2020  ALT 13 01/10/2020  . Total Discharge time is about 33 minutes  Roxan Hockey M.D on 01/15/2020 at 12:31 PM  Go to [email or web address] -  for contact info  [facility] - Office  [phone]

## 2020-01-15 NOTE — Plan of Care (Signed)

## 2020-01-15 NOTE — TOC Transition Note (Addendum)
Transition of Care [facility]) - CM/SW Discharge Note   Patient Details  Name: [name] [name] MRN: [id number] Date of Birth: August 14, 1956  Transition of Care [facility]) CM/SW Contact:  Ihor Gully, LCSW Phone Number: 01/15/2020, 4:45 PM   Clinical Narrative:    Patient to return to [facility] with [facility] hospice which is sister's choice of hospice facilites. Patient's sister, Mrs. Sasser, advised of d/c. Eric with [facility] aware of referral and Ms. Sasser is to sign paperwork 01/16/20 at the facility. RN to call report. RN Network engineer to call EMS.  TOC signing off.   Final next level of care: Skilled Nursing Facility Barriers to Discharge: No Barriers Identified   Patient Goals and CMS Choice        Discharge Placement                       Discharge Plan and Services                                     Social Determinants of Health (SDOH) Interventions     Readmission Risk Interventions No flowsheet data found.

## 2020-01-15 NOTE — Discharge Instructions (Signed)
Discharge back to [facility] SNF with hospice care/comfort measures--- DNR Galvin Proffer

## 2020-01-16 DIAGNOSIS — C50812 Malignant neoplasm of overlapping sites of left female breast: Secondary | ICD-10-CM | POA: Diagnosis not present

## 2020-01-16 DIAGNOSIS — F0280 Dementia in other diseases classified elsewhere without behavioral disturbance: Secondary | ICD-10-CM | POA: Diagnosis not present

## 2020-01-16 DIAGNOSIS — Z515 Encounter for palliative care: Secondary | ICD-10-CM | POA: Diagnosis not present

## 2020-01-16 DIAGNOSIS — M6281 Muscle weakness (generalized): Secondary | ICD-10-CM | POA: Diagnosis not present

## 2020-01-16 DIAGNOSIS — I10 Essential (primary) hypertension: Secondary | ICD-10-CM | POA: Diagnosis not present

## 2020-01-16 DIAGNOSIS — F339 Major depressive disorder, recurrent, unspecified: Secondary | ICD-10-CM | POA: Diagnosis not present

## 2020-01-16 DIAGNOSIS — E639 Nutritional deficiency, unspecified: Secondary | ICD-10-CM | POA: Diagnosis not present

## 2020-01-16 DIAGNOSIS — C7800 Secondary malignant neoplasm of unspecified lung: Secondary | ICD-10-CM | POA: Diagnosis not present

## 2020-01-16 DIAGNOSIS — G309 Alzheimer's disease, unspecified: Secondary | ICD-10-CM | POA: Diagnosis not present

## 2020-01-16 DIAGNOSIS — F0390 Unspecified dementia without behavioral disturbance: Secondary | ICD-10-CM | POA: Diagnosis not present

## 2020-01-17 DIAGNOSIS — I10 Essential (primary) hypertension: Secondary | ICD-10-CM | POA: Diagnosis not present

## 2020-01-17 DIAGNOSIS — R5381 Other malaise: Secondary | ICD-10-CM | POA: Diagnosis not present

## 2020-01-17 DIAGNOSIS — E559 Vitamin D deficiency, unspecified: Secondary | ICD-10-CM | POA: Diagnosis not present

## 2020-01-17 DIAGNOSIS — M6281 Muscle weakness (generalized): Secondary | ICD-10-CM | POA: Diagnosis not present

## 2020-01-18 ENCOUNTER — Encounter (HOSPITAL_COMMUNITY): Payer: Medicare Other

## 2020-01-18 DIAGNOSIS — E639 Nutritional deficiency, unspecified: Secondary | ICD-10-CM | POA: Diagnosis not present

## 2020-01-18 DIAGNOSIS — F0390 Unspecified dementia without behavioral disturbance: Secondary | ICD-10-CM | POA: Diagnosis not present

## 2020-01-18 DIAGNOSIS — C50812 Malignant neoplasm of overlapping sites of left female breast: Secondary | ICD-10-CM | POA: Diagnosis not present

## 2020-01-18 DIAGNOSIS — G309 Alzheimer's disease, unspecified: Secondary | ICD-10-CM | POA: Diagnosis not present

## 2020-01-18 DIAGNOSIS — C7800 Secondary malignant neoplasm of unspecified lung: Secondary | ICD-10-CM | POA: Diagnosis not present

## 2020-01-18 DIAGNOSIS — I10 Essential (primary) hypertension: Secondary | ICD-10-CM | POA: Diagnosis not present

## 2020-01-22 ENCOUNTER — Encounter (HOSPITAL_COMMUNITY): Admission: RE | Admit: 2020-01-22 | Payer: Medicare Other | Source: Ambulatory Visit

## 2020-01-29 ENCOUNTER — Ambulatory Visit (HOSPITAL_COMMUNITY): Payer: Medicare Other | Admitting: Hematology

## 2020-01-29 ENCOUNTER — Ambulatory Visit (HOSPITAL_COMMUNITY): Payer: Medicare Other

## 2020-01-29 ENCOUNTER — Other Ambulatory Visit (HOSPITAL_COMMUNITY): Payer: Medicare Other

## 2020-01-31 ENCOUNTER — Ambulatory Visit (HOSPITAL_COMMUNITY): Payer: Medicare Other

## 2020-01-31 DIAGNOSIS — L0390 Cellulitis, unspecified: Secondary | ICD-10-CM | POA: Diagnosis not present

## 2020-01-31 DIAGNOSIS — L0291 Cutaneous abscess, unspecified: Secondary | ICD-10-CM | POA: Diagnosis not present

## 2020-02-01 DIAGNOSIS — C50912 Malignant neoplasm of unspecified site of left female breast: Secondary | ICD-10-CM | POA: Diagnosis not present

## 2020-02-01 DIAGNOSIS — G309 Alzheimer's disease, unspecified: Secondary | ICD-10-CM | POA: Diagnosis not present

## 2020-02-01 DIAGNOSIS — I10 Essential (primary) hypertension: Secondary | ICD-10-CM | POA: Diagnosis not present

## 2020-02-01 DIAGNOSIS — E559 Vitamin D deficiency, unspecified: Secondary | ICD-10-CM | POA: Diagnosis not present

## 2020-02-01 DIAGNOSIS — F0280 Dementia in other diseases classified elsewhere without behavioral disturbance: Secondary | ICD-10-CM | POA: Diagnosis not present

## 2020-02-01 DIAGNOSIS — F320 Major depressive disorder, single episode, mild: Secondary | ICD-10-CM | POA: Diagnosis not present

## 2020-02-01 DIAGNOSIS — F339 Major depressive disorder, recurrent, unspecified: Secondary | ICD-10-CM | POA: Diagnosis not present

## 2020-02-01 DIAGNOSIS — C50812 Malignant neoplasm of overlapping sites of left female breast: Secondary | ICD-10-CM | POA: Diagnosis not present

## 2020-02-01 DIAGNOSIS — F0390 Unspecified dementia without behavioral disturbance: Secondary | ICD-10-CM | POA: Diagnosis not present

## 2020-02-05 DIAGNOSIS — L0390 Cellulitis, unspecified: Secondary | ICD-10-CM | POA: Diagnosis not present

## 2020-02-05 DIAGNOSIS — L0291 Cutaneous abscess, unspecified: Secondary | ICD-10-CM | POA: Diagnosis not present

## 2020-02-05 DIAGNOSIS — F419 Anxiety disorder, unspecified: Secondary | ICD-10-CM | POA: Diagnosis not present

## 2020-02-05 DIAGNOSIS — C50912 Malignant neoplasm of unspecified site of left female breast: Secondary | ICD-10-CM | POA: Diagnosis not present

## 2020-02-05 DIAGNOSIS — G8929 Other chronic pain: Secondary | ICD-10-CM | POA: Diagnosis not present

## 2020-02-07 DIAGNOSIS — M6281 Muscle weakness (generalized): Secondary | ICD-10-CM | POA: Diagnosis not present

## 2020-02-07 DIAGNOSIS — E559 Vitamin D deficiency, unspecified: Secondary | ICD-10-CM | POA: Diagnosis not present

## 2020-02-07 DIAGNOSIS — C50812 Malignant neoplasm of overlapping sites of left female breast: Secondary | ICD-10-CM | POA: Diagnosis not present

## 2020-02-07 DIAGNOSIS — I10 Essential (primary) hypertension: Secondary | ICD-10-CM | POA: Diagnosis not present

## 2020-02-21 DIAGNOSIS — G309 Alzheimer's disease, unspecified: Secondary | ICD-10-CM | POA: Diagnosis not present

## 2020-02-21 DIAGNOSIS — C50812 Malignant neoplasm of overlapping sites of left female breast: Secondary | ICD-10-CM | POA: Diagnosis not present

## 2020-02-21 DIAGNOSIS — C7800 Secondary malignant neoplasm of unspecified lung: Secondary | ICD-10-CM | POA: Diagnosis not present

## 2020-02-21 DIAGNOSIS — F0280 Dementia in other diseases classified elsewhere without behavioral disturbance: Secondary | ICD-10-CM | POA: Diagnosis not present

## 2020-02-21 DIAGNOSIS — E639 Nutritional deficiency, unspecified: Secondary | ICD-10-CM | POA: Diagnosis not present

## 2020-02-21 DIAGNOSIS — I10 Essential (primary) hypertension: Secondary | ICD-10-CM | POA: Diagnosis not present

## 2020-02-21 DIAGNOSIS — M6281 Muscle weakness (generalized): Secondary | ICD-10-CM | POA: Diagnosis not present

## 2020-02-21 DIAGNOSIS — Z515 Encounter for palliative care: Secondary | ICD-10-CM | POA: Diagnosis not present

## 2020-02-26 DIAGNOSIS — F0280 Dementia in other diseases classified elsewhere without behavioral disturbance: Secondary | ICD-10-CM | POA: Diagnosis not present

## 2020-02-26 DIAGNOSIS — I10 Essential (primary) hypertension: Secondary | ICD-10-CM | POA: Diagnosis not present

## 2020-02-26 DIAGNOSIS — G309 Alzheimer's disease, unspecified: Secondary | ICD-10-CM | POA: Diagnosis not present

## 2020-02-26 DIAGNOSIS — C50812 Malignant neoplasm of overlapping sites of left female breast: Secondary | ICD-10-CM | POA: Diagnosis not present

## 2020-02-26 DIAGNOSIS — E639 Nutritional deficiency, unspecified: Secondary | ICD-10-CM | POA: Diagnosis not present

## 2020-02-26 DIAGNOSIS — C7800 Secondary malignant neoplasm of unspecified lung: Secondary | ICD-10-CM | POA: Diagnosis not present

## 2020-02-28 DIAGNOSIS — E639 Nutritional deficiency, unspecified: Secondary | ICD-10-CM | POA: Diagnosis not present

## 2020-02-28 DIAGNOSIS — C50812 Malignant neoplasm of overlapping sites of left female breast: Secondary | ICD-10-CM | POA: Diagnosis not present

## 2020-02-28 DIAGNOSIS — G309 Alzheimer's disease, unspecified: Secondary | ICD-10-CM | POA: Diagnosis not present

## 2020-02-28 DIAGNOSIS — F0280 Dementia in other diseases classified elsewhere without behavioral disturbance: Secondary | ICD-10-CM | POA: Diagnosis not present

## 2020-02-28 DIAGNOSIS — C7800 Secondary malignant neoplasm of unspecified lung: Secondary | ICD-10-CM | POA: Diagnosis not present

## 2020-02-28 DIAGNOSIS — I10 Essential (primary) hypertension: Secondary | ICD-10-CM | POA: Diagnosis not present

## 2020-03-03 DIAGNOSIS — C7800 Secondary malignant neoplasm of unspecified lung: Secondary | ICD-10-CM | POA: Diagnosis not present

## 2020-03-03 DIAGNOSIS — I10 Essential (primary) hypertension: Secondary | ICD-10-CM | POA: Diagnosis not present

## 2020-03-03 DIAGNOSIS — F320 Major depressive disorder, single episode, mild: Secondary | ICD-10-CM | POA: Diagnosis not present

## 2020-03-03 DIAGNOSIS — C50812 Malignant neoplasm of overlapping sites of left female breast: Secondary | ICD-10-CM | POA: Diagnosis not present

## 2020-03-03 DIAGNOSIS — E639 Nutritional deficiency, unspecified: Secondary | ICD-10-CM | POA: Diagnosis not present

## 2020-03-03 DIAGNOSIS — E559 Vitamin D deficiency, unspecified: Secondary | ICD-10-CM | POA: Diagnosis not present

## 2020-03-03 DIAGNOSIS — F0390 Unspecified dementia without behavioral disturbance: Secondary | ICD-10-CM | POA: Diagnosis not present

## 2020-03-03 DIAGNOSIS — G309 Alzheimer's disease, unspecified: Secondary | ICD-10-CM | POA: Diagnosis not present

## 2020-03-03 DIAGNOSIS — C50912 Malignant neoplasm of unspecified site of left female breast: Secondary | ICD-10-CM | POA: Diagnosis not present

## 2020-03-03 DIAGNOSIS — F339 Major depressive disorder, recurrent, unspecified: Secondary | ICD-10-CM | POA: Diagnosis not present

## 2020-03-03 DIAGNOSIS — F0280 Dementia in other diseases classified elsewhere without behavioral disturbance: Secondary | ICD-10-CM | POA: Diagnosis not present

## 2020-03-05 DIAGNOSIS — G8929 Other chronic pain: Secondary | ICD-10-CM | POA: Diagnosis not present

## 2020-03-05 DIAGNOSIS — I10 Essential (primary) hypertension: Secondary | ICD-10-CM | POA: Diagnosis not present

## 2020-03-05 DIAGNOSIS — C50812 Malignant neoplasm of overlapping sites of left female breast: Secondary | ICD-10-CM | POA: Diagnosis not present

## 2020-03-05 DIAGNOSIS — E559 Vitamin D deficiency, unspecified: Secondary | ICD-10-CM | POA: Diagnosis not present

## 2020-03-06 DIAGNOSIS — E559 Vitamin D deficiency, unspecified: Secondary | ICD-10-CM | POA: Diagnosis not present

## 2020-03-06 DIAGNOSIS — G309 Alzheimer's disease, unspecified: Secondary | ICD-10-CM | POA: Diagnosis not present

## 2020-03-06 DIAGNOSIS — F0280 Dementia in other diseases classified elsewhere without behavioral disturbance: Secondary | ICD-10-CM | POA: Diagnosis not present

## 2020-03-06 DIAGNOSIS — C50812 Malignant neoplasm of overlapping sites of left female breast: Secondary | ICD-10-CM | POA: Diagnosis not present

## 2020-03-06 DIAGNOSIS — I10 Essential (primary) hypertension: Secondary | ICD-10-CM | POA: Diagnosis not present

## 2020-03-06 DIAGNOSIS — C7800 Secondary malignant neoplasm of unspecified lung: Secondary | ICD-10-CM | POA: Diagnosis not present

## 2020-03-06 DIAGNOSIS — E639 Nutritional deficiency, unspecified: Secondary | ICD-10-CM | POA: Diagnosis not present

## 2020-03-06 DIAGNOSIS — M6281 Muscle weakness (generalized): Secondary | ICD-10-CM | POA: Diagnosis not present

## 2020-03-06 DIAGNOSIS — R5381 Other malaise: Secondary | ICD-10-CM | POA: Diagnosis not present

## 2020-03-08 DIAGNOSIS — G309 Alzheimer's disease, unspecified: Secondary | ICD-10-CM | POA: Diagnosis not present

## 2020-03-08 DIAGNOSIS — E639 Nutritional deficiency, unspecified: Secondary | ICD-10-CM | POA: Diagnosis not present

## 2020-03-08 DIAGNOSIS — C50812 Malignant neoplasm of overlapping sites of left female breast: Secondary | ICD-10-CM | POA: Diagnosis not present

## 2020-03-08 DIAGNOSIS — F0280 Dementia in other diseases classified elsewhere without behavioral disturbance: Secondary | ICD-10-CM | POA: Diagnosis not present

## 2020-03-08 DIAGNOSIS — C7800 Secondary malignant neoplasm of unspecified lung: Secondary | ICD-10-CM | POA: Diagnosis not present

## 2020-03-08 DIAGNOSIS — I10 Essential (primary) hypertension: Secondary | ICD-10-CM | POA: Diagnosis not present

## 2020-03-10 DIAGNOSIS — E639 Nutritional deficiency, unspecified: Secondary | ICD-10-CM | POA: Diagnosis not present

## 2020-03-10 DIAGNOSIS — C7800 Secondary malignant neoplasm of unspecified lung: Secondary | ICD-10-CM | POA: Diagnosis not present

## 2020-03-10 DIAGNOSIS — F0280 Dementia in other diseases classified elsewhere without behavioral disturbance: Secondary | ICD-10-CM | POA: Diagnosis not present

## 2020-03-10 DIAGNOSIS — I10 Essential (primary) hypertension: Secondary | ICD-10-CM | POA: Diagnosis not present

## 2020-03-10 DIAGNOSIS — C50812 Malignant neoplasm of overlapping sites of left female breast: Secondary | ICD-10-CM | POA: Diagnosis not present

## 2020-03-10 DIAGNOSIS — G309 Alzheimer's disease, unspecified: Secondary | ICD-10-CM | POA: Diagnosis not present

## 2020-03-11 DIAGNOSIS — C50812 Malignant neoplasm of overlapping sites of left female breast: Secondary | ICD-10-CM | POA: Diagnosis not present

## 2020-03-11 DIAGNOSIS — C7800 Secondary malignant neoplasm of unspecified lung: Secondary | ICD-10-CM | POA: Diagnosis not present

## 2020-03-11 DIAGNOSIS — I10 Essential (primary) hypertension: Secondary | ICD-10-CM | POA: Diagnosis not present

## 2020-03-11 DIAGNOSIS — F0280 Dementia in other diseases classified elsewhere without behavioral disturbance: Secondary | ICD-10-CM | POA: Diagnosis not present

## 2020-03-11 DIAGNOSIS — G309 Alzheimer's disease, unspecified: Secondary | ICD-10-CM | POA: Diagnosis not present

## 2020-03-11 DIAGNOSIS — E639 Nutritional deficiency, unspecified: Secondary | ICD-10-CM | POA: Diagnosis not present

## 2020-03-13 DIAGNOSIS — C50812 Malignant neoplasm of overlapping sites of left female breast: Secondary | ICD-10-CM | POA: Diagnosis not present

## 2020-03-13 DIAGNOSIS — C7800 Secondary malignant neoplasm of unspecified lung: Secondary | ICD-10-CM | POA: Diagnosis not present

## 2020-03-13 DIAGNOSIS — F0280 Dementia in other diseases classified elsewhere without behavioral disturbance: Secondary | ICD-10-CM | POA: Diagnosis not present

## 2020-03-13 DIAGNOSIS — E639 Nutritional deficiency, unspecified: Secondary | ICD-10-CM | POA: Diagnosis not present

## 2020-03-13 DIAGNOSIS — G309 Alzheimer's disease, unspecified: Secondary | ICD-10-CM | POA: Diagnosis not present

## 2020-03-13 DIAGNOSIS — I10 Essential (primary) hypertension: Secondary | ICD-10-CM | POA: Diagnosis not present

## 2020-03-16 DIAGNOSIS — I10 Essential (primary) hypertension: Secondary | ICD-10-CM | POA: Diagnosis not present

## 2020-03-16 DIAGNOSIS — E639 Nutritional deficiency, unspecified: Secondary | ICD-10-CM | POA: Diagnosis not present

## 2020-03-16 DIAGNOSIS — C50812 Malignant neoplasm of overlapping sites of left female breast: Secondary | ICD-10-CM | POA: Diagnosis not present

## 2020-03-16 DIAGNOSIS — F0280 Dementia in other diseases classified elsewhere without behavioral disturbance: Secondary | ICD-10-CM | POA: Diagnosis not present

## 2020-03-16 DIAGNOSIS — G309 Alzheimer's disease, unspecified: Secondary | ICD-10-CM | POA: Diagnosis not present

## 2020-03-16 DIAGNOSIS — C7800 Secondary malignant neoplasm of unspecified lung: Secondary | ICD-10-CM | POA: Diagnosis not present

## 2020-03-18 DIAGNOSIS — G309 Alzheimer's disease, unspecified: Secondary | ICD-10-CM | POA: Diagnosis not present

## 2020-03-18 DIAGNOSIS — I10 Essential (primary) hypertension: Secondary | ICD-10-CM | POA: Diagnosis not present

## 2020-03-18 DIAGNOSIS — E639 Nutritional deficiency, unspecified: Secondary | ICD-10-CM | POA: Diagnosis not present

## 2020-03-18 DIAGNOSIS — C7800 Secondary malignant neoplasm of unspecified lung: Secondary | ICD-10-CM | POA: Diagnosis not present

## 2020-03-18 DIAGNOSIS — C50812 Malignant neoplasm of overlapping sites of left female breast: Secondary | ICD-10-CM | POA: Diagnosis not present

## 2020-03-18 DIAGNOSIS — F0280 Dementia in other diseases classified elsewhere without behavioral disturbance: Secondary | ICD-10-CM | POA: Diagnosis not present

## 2020-03-19 DIAGNOSIS — I10 Essential (primary) hypertension: Secondary | ICD-10-CM | POA: Diagnosis not present

## 2020-03-19 DIAGNOSIS — E639 Nutritional deficiency, unspecified: Secondary | ICD-10-CM | POA: Diagnosis not present

## 2020-03-19 DIAGNOSIS — C7800 Secondary malignant neoplasm of unspecified lung: Secondary | ICD-10-CM | POA: Diagnosis not present

## 2020-03-19 DIAGNOSIS — C50812 Malignant neoplasm of overlapping sites of left female breast: Secondary | ICD-10-CM | POA: Diagnosis not present

## 2020-03-19 DIAGNOSIS — G309 Alzheimer's disease, unspecified: Secondary | ICD-10-CM | POA: Diagnosis not present

## 2020-03-19 DIAGNOSIS — F0280 Dementia in other diseases classified elsewhere without behavioral disturbance: Secondary | ICD-10-CM | POA: Diagnosis not present

## 2020-03-20 DIAGNOSIS — C50812 Malignant neoplasm of overlapping sites of left female breast: Secondary | ICD-10-CM | POA: Diagnosis not present

## 2020-03-20 DIAGNOSIS — C7800 Secondary malignant neoplasm of unspecified lung: Secondary | ICD-10-CM | POA: Diagnosis not present

## 2020-03-20 DIAGNOSIS — F0280 Dementia in other diseases classified elsewhere without behavioral disturbance: Secondary | ICD-10-CM | POA: Diagnosis not present

## 2020-03-20 DIAGNOSIS — I10 Essential (primary) hypertension: Secondary | ICD-10-CM | POA: Diagnosis not present

## 2020-03-20 DIAGNOSIS — E639 Nutritional deficiency, unspecified: Secondary | ICD-10-CM | POA: Diagnosis not present

## 2020-03-20 DIAGNOSIS — G309 Alzheimer's disease, unspecified: Secondary | ICD-10-CM | POA: Diagnosis not present

## 2020-03-23 DIAGNOSIS — E639 Nutritional deficiency, unspecified: Secondary | ICD-10-CM | POA: Diagnosis not present

## 2020-03-23 DIAGNOSIS — C50812 Malignant neoplasm of overlapping sites of left female breast: Secondary | ICD-10-CM | POA: Diagnosis not present

## 2020-03-23 DIAGNOSIS — G309 Alzheimer's disease, unspecified: Secondary | ICD-10-CM | POA: Diagnosis not present

## 2020-03-23 DIAGNOSIS — I10 Essential (primary) hypertension: Secondary | ICD-10-CM | POA: Diagnosis not present

## 2020-03-23 DIAGNOSIS — M6281 Muscle weakness (generalized): Secondary | ICD-10-CM | POA: Diagnosis not present

## 2020-03-23 DIAGNOSIS — C7800 Secondary malignant neoplasm of unspecified lung: Secondary | ICD-10-CM | POA: Diagnosis not present

## 2020-03-23 DIAGNOSIS — Z515 Encounter for palliative care: Secondary | ICD-10-CM | POA: Diagnosis not present

## 2020-03-23 DIAGNOSIS — F0280 Dementia in other diseases classified elsewhere without behavioral disturbance: Secondary | ICD-10-CM | POA: Diagnosis not present

## 2020-03-25 DIAGNOSIS — F0280 Dementia in other diseases classified elsewhere without behavioral disturbance: Secondary | ICD-10-CM | POA: Diagnosis not present

## 2020-03-25 DIAGNOSIS — G309 Alzheimer's disease, unspecified: Secondary | ICD-10-CM | POA: Diagnosis not present

## 2020-03-25 DIAGNOSIS — E639 Nutritional deficiency, unspecified: Secondary | ICD-10-CM | POA: Diagnosis not present

## 2020-03-25 DIAGNOSIS — C50812 Malignant neoplasm of overlapping sites of left female breast: Secondary | ICD-10-CM | POA: Diagnosis not present

## 2020-03-25 DIAGNOSIS — C7800 Secondary malignant neoplasm of unspecified lung: Secondary | ICD-10-CM | POA: Diagnosis not present

## 2020-03-25 DIAGNOSIS — I10 Essential (primary) hypertension: Secondary | ICD-10-CM | POA: Diagnosis not present

## 2020-03-27 DIAGNOSIS — I10 Essential (primary) hypertension: Secondary | ICD-10-CM | POA: Diagnosis not present

## 2020-03-27 DIAGNOSIS — E639 Nutritional deficiency, unspecified: Secondary | ICD-10-CM | POA: Diagnosis not present

## 2020-03-27 DIAGNOSIS — C7800 Secondary malignant neoplasm of unspecified lung: Secondary | ICD-10-CM | POA: Diagnosis not present

## 2020-03-27 DIAGNOSIS — C50812 Malignant neoplasm of overlapping sites of left female breast: Secondary | ICD-10-CM | POA: Diagnosis not present

## 2020-03-27 DIAGNOSIS — G309 Alzheimer's disease, unspecified: Secondary | ICD-10-CM | POA: Diagnosis not present

## 2020-03-27 DIAGNOSIS — F0280 Dementia in other diseases classified elsewhere without behavioral disturbance: Secondary | ICD-10-CM | POA: Diagnosis not present

## 2020-03-31 DIAGNOSIS — G309 Alzheimer's disease, unspecified: Secondary | ICD-10-CM | POA: Diagnosis not present

## 2020-03-31 DIAGNOSIS — F320 Major depressive disorder, single episode, mild: Secondary | ICD-10-CM | POA: Diagnosis not present

## 2020-03-31 DIAGNOSIS — F419 Anxiety disorder, unspecified: Secondary | ICD-10-CM | POA: Diagnosis not present

## 2020-04-01 DIAGNOSIS — E639 Nutritional deficiency, unspecified: Secondary | ICD-10-CM | POA: Diagnosis not present

## 2020-04-01 DIAGNOSIS — G309 Alzheimer's disease, unspecified: Secondary | ICD-10-CM | POA: Diagnosis not present

## 2020-04-01 DIAGNOSIS — F0280 Dementia in other diseases classified elsewhere without behavioral disturbance: Secondary | ICD-10-CM | POA: Diagnosis not present

## 2020-04-01 DIAGNOSIS — C50812 Malignant neoplasm of overlapping sites of left female breast: Secondary | ICD-10-CM | POA: Diagnosis not present

## 2020-04-01 DIAGNOSIS — I10 Essential (primary) hypertension: Secondary | ICD-10-CM | POA: Diagnosis not present

## 2020-04-01 DIAGNOSIS — C7800 Secondary malignant neoplasm of unspecified lung: Secondary | ICD-10-CM | POA: Diagnosis not present

## 2020-04-03 DIAGNOSIS — I10 Essential (primary) hypertension: Secondary | ICD-10-CM | POA: Diagnosis not present

## 2020-04-03 DIAGNOSIS — F0280 Dementia in other diseases classified elsewhere without behavioral disturbance: Secondary | ICD-10-CM | POA: Diagnosis not present

## 2020-04-03 DIAGNOSIS — E559 Vitamin D deficiency, unspecified: Secondary | ICD-10-CM | POA: Diagnosis not present

## 2020-04-03 DIAGNOSIS — C7800 Secondary malignant neoplasm of unspecified lung: Secondary | ICD-10-CM | POA: Diagnosis not present

## 2020-04-03 DIAGNOSIS — C50812 Malignant neoplasm of overlapping sites of left female breast: Secondary | ICD-10-CM | POA: Diagnosis not present

## 2020-04-03 DIAGNOSIS — E639 Nutritional deficiency, unspecified: Secondary | ICD-10-CM | POA: Diagnosis not present

## 2020-04-03 DIAGNOSIS — G309 Alzheimer's disease, unspecified: Secondary | ICD-10-CM | POA: Diagnosis not present

## 2020-04-04 DIAGNOSIS — C50812 Malignant neoplasm of overlapping sites of left female breast: Secondary | ICD-10-CM | POA: Diagnosis not present

## 2020-04-04 DIAGNOSIS — E639 Nutritional deficiency, unspecified: Secondary | ICD-10-CM | POA: Diagnosis not present

## 2020-04-04 DIAGNOSIS — F0280 Dementia in other diseases classified elsewhere without behavioral disturbance: Secondary | ICD-10-CM | POA: Diagnosis not present

## 2020-04-04 DIAGNOSIS — C7800 Secondary malignant neoplasm of unspecified lung: Secondary | ICD-10-CM | POA: Diagnosis not present

## 2020-04-04 DIAGNOSIS — I10 Essential (primary) hypertension: Secondary | ICD-10-CM | POA: Diagnosis not present

## 2020-04-04 DIAGNOSIS — G309 Alzheimer's disease, unspecified: Secondary | ICD-10-CM | POA: Diagnosis not present

## 2020-04-05 DIAGNOSIS — I10 Essential (primary) hypertension: Secondary | ICD-10-CM | POA: Diagnosis not present

## 2020-04-05 DIAGNOSIS — E639 Nutritional deficiency, unspecified: Secondary | ICD-10-CM | POA: Diagnosis not present

## 2020-04-05 DIAGNOSIS — C7800 Secondary malignant neoplasm of unspecified lung: Secondary | ICD-10-CM | POA: Diagnosis not present

## 2020-04-05 DIAGNOSIS — F0280 Dementia in other diseases classified elsewhere without behavioral disturbance: Secondary | ICD-10-CM | POA: Diagnosis not present

## 2020-04-05 DIAGNOSIS — G309 Alzheimer's disease, unspecified: Secondary | ICD-10-CM | POA: Diagnosis not present

## 2020-04-05 DIAGNOSIS — C50812 Malignant neoplasm of overlapping sites of left female breast: Secondary | ICD-10-CM | POA: Diagnosis not present

## 2020-04-08 DIAGNOSIS — C7800 Secondary malignant neoplasm of unspecified lung: Secondary | ICD-10-CM | POA: Diagnosis not present

## 2020-04-08 DIAGNOSIS — M79675 Pain in left toe(s): Secondary | ICD-10-CM | POA: Diagnosis not present

## 2020-04-08 DIAGNOSIS — C50812 Malignant neoplasm of overlapping sites of left female breast: Secondary | ICD-10-CM | POA: Diagnosis not present

## 2020-04-08 DIAGNOSIS — I10 Essential (primary) hypertension: Secondary | ICD-10-CM | POA: Diagnosis not present

## 2020-04-08 DIAGNOSIS — F0280 Dementia in other diseases classified elsewhere without behavioral disturbance: Secondary | ICD-10-CM | POA: Diagnosis not present

## 2020-04-08 DIAGNOSIS — G309 Alzheimer's disease, unspecified: Secondary | ICD-10-CM | POA: Diagnosis not present

## 2020-04-08 DIAGNOSIS — B351 Tinea unguium: Secondary | ICD-10-CM | POA: Diagnosis not present

## 2020-04-08 DIAGNOSIS — M79674 Pain in right toe(s): Secondary | ICD-10-CM | POA: Diagnosis not present

## 2020-04-08 DIAGNOSIS — E639 Nutritional deficiency, unspecified: Secondary | ICD-10-CM | POA: Diagnosis not present

## 2020-04-10 DIAGNOSIS — C7800 Secondary malignant neoplasm of unspecified lung: Secondary | ICD-10-CM | POA: Diagnosis not present

## 2020-04-10 DIAGNOSIS — G309 Alzheimer's disease, unspecified: Secondary | ICD-10-CM | POA: Diagnosis not present

## 2020-04-10 DIAGNOSIS — I10 Essential (primary) hypertension: Secondary | ICD-10-CM | POA: Diagnosis not present

## 2020-04-10 DIAGNOSIS — E639 Nutritional deficiency, unspecified: Secondary | ICD-10-CM | POA: Diagnosis not present

## 2020-04-10 DIAGNOSIS — F0280 Dementia in other diseases classified elsewhere without behavioral disturbance: Secondary | ICD-10-CM | POA: Diagnosis not present

## 2020-04-10 DIAGNOSIS — C50812 Malignant neoplasm of overlapping sites of left female breast: Secondary | ICD-10-CM | POA: Diagnosis not present

## 2020-04-11 DIAGNOSIS — F0280 Dementia in other diseases classified elsewhere without behavioral disturbance: Secondary | ICD-10-CM | POA: Diagnosis not present

## 2020-04-11 DIAGNOSIS — I10 Essential (primary) hypertension: Secondary | ICD-10-CM | POA: Diagnosis not present

## 2020-04-11 DIAGNOSIS — G309 Alzheimer's disease, unspecified: Secondary | ICD-10-CM | POA: Diagnosis not present

## 2020-04-11 DIAGNOSIS — E639 Nutritional deficiency, unspecified: Secondary | ICD-10-CM | POA: Diagnosis not present

## 2020-04-11 DIAGNOSIS — C7800 Secondary malignant neoplasm of unspecified lung: Secondary | ICD-10-CM | POA: Diagnosis not present

## 2020-04-11 DIAGNOSIS — C50812 Malignant neoplasm of overlapping sites of left female breast: Secondary | ICD-10-CM | POA: Diagnosis not present

## 2020-04-15 DIAGNOSIS — G309 Alzheimer's disease, unspecified: Secondary | ICD-10-CM | POA: Diagnosis not present

## 2020-04-15 DIAGNOSIS — C7800 Secondary malignant neoplasm of unspecified lung: Secondary | ICD-10-CM | POA: Diagnosis not present

## 2020-04-15 DIAGNOSIS — E639 Nutritional deficiency, unspecified: Secondary | ICD-10-CM | POA: Diagnosis not present

## 2020-04-15 DIAGNOSIS — C50812 Malignant neoplasm of overlapping sites of left female breast: Secondary | ICD-10-CM | POA: Diagnosis not present

## 2020-04-15 DIAGNOSIS — F0280 Dementia in other diseases classified elsewhere without behavioral disturbance: Secondary | ICD-10-CM | POA: Diagnosis not present

## 2020-04-15 DIAGNOSIS — I10 Essential (primary) hypertension: Secondary | ICD-10-CM | POA: Diagnosis not present

## 2020-04-17 DIAGNOSIS — F0280 Dementia in other diseases classified elsewhere without behavioral disturbance: Secondary | ICD-10-CM | POA: Diagnosis not present

## 2020-04-17 DIAGNOSIS — I10 Essential (primary) hypertension: Secondary | ICD-10-CM | POA: Diagnosis not present

## 2020-04-17 DIAGNOSIS — C50812 Malignant neoplasm of overlapping sites of left female breast: Secondary | ICD-10-CM | POA: Diagnosis not present

## 2020-04-17 DIAGNOSIS — G309 Alzheimer's disease, unspecified: Secondary | ICD-10-CM | POA: Diagnosis not present

## 2020-04-17 DIAGNOSIS — C7800 Secondary malignant neoplasm of unspecified lung: Secondary | ICD-10-CM | POA: Diagnosis not present

## 2020-04-17 DIAGNOSIS — E639 Nutritional deficiency, unspecified: Secondary | ICD-10-CM | POA: Diagnosis not present

## 2020-04-18 DIAGNOSIS — E639 Nutritional deficiency, unspecified: Secondary | ICD-10-CM | POA: Diagnosis not present

## 2020-04-18 DIAGNOSIS — C50812 Malignant neoplasm of overlapping sites of left female breast: Secondary | ICD-10-CM | POA: Diagnosis not present

## 2020-04-18 DIAGNOSIS — G309 Alzheimer's disease, unspecified: Secondary | ICD-10-CM | POA: Diagnosis not present

## 2020-04-18 DIAGNOSIS — F0280 Dementia in other diseases classified elsewhere without behavioral disturbance: Secondary | ICD-10-CM | POA: Diagnosis not present

## 2020-04-18 DIAGNOSIS — C7800 Secondary malignant neoplasm of unspecified lung: Secondary | ICD-10-CM | POA: Diagnosis not present

## 2020-04-18 DIAGNOSIS — I10 Essential (primary) hypertension: Secondary | ICD-10-CM | POA: Diagnosis not present

## 2020-04-19 DIAGNOSIS — C7800 Secondary malignant neoplasm of unspecified lung: Secondary | ICD-10-CM | POA: Diagnosis not present

## 2020-04-19 DIAGNOSIS — E639 Nutritional deficiency, unspecified: Secondary | ICD-10-CM | POA: Diagnosis not present

## 2020-04-19 DIAGNOSIS — F0280 Dementia in other diseases classified elsewhere without behavioral disturbance: Secondary | ICD-10-CM | POA: Diagnosis not present

## 2020-04-19 DIAGNOSIS — G309 Alzheimer's disease, unspecified: Secondary | ICD-10-CM | POA: Diagnosis not present

## 2020-04-19 DIAGNOSIS — C50812 Malignant neoplasm of overlapping sites of left female breast: Secondary | ICD-10-CM | POA: Diagnosis not present

## 2020-04-19 DIAGNOSIS — I10 Essential (primary) hypertension: Secondary | ICD-10-CM | POA: Diagnosis not present

## 2020-04-20 DIAGNOSIS — I10 Essential (primary) hypertension: Secondary | ICD-10-CM | POA: Diagnosis not present

## 2020-04-20 DIAGNOSIS — E639 Nutritional deficiency, unspecified: Secondary | ICD-10-CM | POA: Diagnosis not present

## 2020-04-20 DIAGNOSIS — C7800 Secondary malignant neoplasm of unspecified lung: Secondary | ICD-10-CM | POA: Diagnosis not present

## 2020-04-20 DIAGNOSIS — F0280 Dementia in other diseases classified elsewhere without behavioral disturbance: Secondary | ICD-10-CM | POA: Diagnosis not present

## 2020-04-20 DIAGNOSIS — G309 Alzheimer's disease, unspecified: Secondary | ICD-10-CM | POA: Diagnosis not present

## 2020-04-20 DIAGNOSIS — C50812 Malignant neoplasm of overlapping sites of left female breast: Secondary | ICD-10-CM | POA: Diagnosis not present

## 2020-04-21 DIAGNOSIS — E639 Nutritional deficiency, unspecified: Secondary | ICD-10-CM | POA: Diagnosis not present

## 2020-04-21 DIAGNOSIS — I10 Essential (primary) hypertension: Secondary | ICD-10-CM | POA: Diagnosis not present

## 2020-04-21 DIAGNOSIS — F0280 Dementia in other diseases classified elsewhere without behavioral disturbance: Secondary | ICD-10-CM | POA: Diagnosis not present

## 2020-04-21 DIAGNOSIS — C7800 Secondary malignant neoplasm of unspecified lung: Secondary | ICD-10-CM | POA: Diagnosis not present

## 2020-04-21 DIAGNOSIS — C50812 Malignant neoplasm of overlapping sites of left female breast: Secondary | ICD-10-CM | POA: Diagnosis not present

## 2020-04-21 DIAGNOSIS — G309 Alzheimer's disease, unspecified: Secondary | ICD-10-CM | POA: Diagnosis not present

## 2020-04-22 DIAGNOSIS — I10 Essential (primary) hypertension: Secondary | ICD-10-CM | POA: Diagnosis not present

## 2020-04-22 DIAGNOSIS — C50812 Malignant neoplasm of overlapping sites of left female breast: Secondary | ICD-10-CM | POA: Diagnosis not present

## 2020-04-22 DIAGNOSIS — C7800 Secondary malignant neoplasm of unspecified lung: Secondary | ICD-10-CM | POA: Diagnosis not present

## 2020-04-22 DIAGNOSIS — E639 Nutritional deficiency, unspecified: Secondary | ICD-10-CM | POA: Diagnosis not present

## 2020-04-22 DIAGNOSIS — G309 Alzheimer's disease, unspecified: Secondary | ICD-10-CM | POA: Diagnosis not present

## 2020-04-22 DIAGNOSIS — F0280 Dementia in other diseases classified elsewhere without behavioral disturbance: Secondary | ICD-10-CM | POA: Diagnosis not present

## 2020-04-23 DIAGNOSIS — C7800 Secondary malignant neoplasm of unspecified lung: Secondary | ICD-10-CM | POA: Diagnosis not present

## 2020-04-23 DIAGNOSIS — M6281 Muscle weakness (generalized): Secondary | ICD-10-CM | POA: Diagnosis not present

## 2020-04-23 DIAGNOSIS — I10 Essential (primary) hypertension: Secondary | ICD-10-CM | POA: Diagnosis not present

## 2020-04-23 DIAGNOSIS — E639 Nutritional deficiency, unspecified: Secondary | ICD-10-CM | POA: Diagnosis not present

## 2020-04-23 DIAGNOSIS — F0280 Dementia in other diseases classified elsewhere without behavioral disturbance: Secondary | ICD-10-CM | POA: Diagnosis not present

## 2020-04-23 DIAGNOSIS — C50812 Malignant neoplasm of overlapping sites of left female breast: Secondary | ICD-10-CM | POA: Diagnosis not present

## 2020-04-23 DIAGNOSIS — G309 Alzheimer's disease, unspecified: Secondary | ICD-10-CM | POA: Diagnosis not present

## 2020-04-23 DIAGNOSIS — Z515 Encounter for palliative care: Secondary | ICD-10-CM | POA: Diagnosis not present

## 2020-04-24 DIAGNOSIS — F0280 Dementia in other diseases classified elsewhere without behavioral disturbance: Secondary | ICD-10-CM | POA: Diagnosis not present

## 2020-04-24 DIAGNOSIS — G309 Alzheimer's disease, unspecified: Secondary | ICD-10-CM | POA: Diagnosis not present

## 2020-04-24 DIAGNOSIS — C7800 Secondary malignant neoplasm of unspecified lung: Secondary | ICD-10-CM | POA: Diagnosis not present

## 2020-04-24 DIAGNOSIS — C50812 Malignant neoplasm of overlapping sites of left female breast: Secondary | ICD-10-CM | POA: Diagnosis not present

## 2020-04-24 DIAGNOSIS — I10 Essential (primary) hypertension: Secondary | ICD-10-CM | POA: Diagnosis not present

## 2020-04-24 DIAGNOSIS — E639 Nutritional deficiency, unspecified: Secondary | ICD-10-CM | POA: Diagnosis not present

## 2020-04-29 DIAGNOSIS — G309 Alzheimer's disease, unspecified: Secondary | ICD-10-CM | POA: Diagnosis not present

## 2020-04-29 DIAGNOSIS — F0280 Dementia in other diseases classified elsewhere without behavioral disturbance: Secondary | ICD-10-CM | POA: Diagnosis not present

## 2020-04-29 DIAGNOSIS — C7800 Secondary malignant neoplasm of unspecified lung: Secondary | ICD-10-CM | POA: Diagnosis not present

## 2020-04-29 DIAGNOSIS — I10 Essential (primary) hypertension: Secondary | ICD-10-CM | POA: Diagnosis not present

## 2020-04-29 DIAGNOSIS — E639 Nutritional deficiency, unspecified: Secondary | ICD-10-CM | POA: Diagnosis not present

## 2020-04-29 DIAGNOSIS — C50812 Malignant neoplasm of overlapping sites of left female breast: Secondary | ICD-10-CM | POA: Diagnosis not present

## 2020-05-01 DIAGNOSIS — C7800 Secondary malignant neoplasm of unspecified lung: Secondary | ICD-10-CM | POA: Diagnosis not present

## 2020-05-01 DIAGNOSIS — E559 Vitamin D deficiency, unspecified: Secondary | ICD-10-CM | POA: Diagnosis not present

## 2020-05-01 DIAGNOSIS — C50812 Malignant neoplasm of overlapping sites of left female breast: Secondary | ICD-10-CM | POA: Diagnosis not present

## 2020-05-01 DIAGNOSIS — G8929 Other chronic pain: Secondary | ICD-10-CM | POA: Diagnosis not present

## 2020-05-02 DIAGNOSIS — E639 Nutritional deficiency, unspecified: Secondary | ICD-10-CM | POA: Diagnosis not present

## 2020-05-02 DIAGNOSIS — I10 Essential (primary) hypertension: Secondary | ICD-10-CM | POA: Diagnosis not present

## 2020-05-02 DIAGNOSIS — F0280 Dementia in other diseases classified elsewhere without behavioral disturbance: Secondary | ICD-10-CM | POA: Diagnosis not present

## 2020-05-02 DIAGNOSIS — C7800 Secondary malignant neoplasm of unspecified lung: Secondary | ICD-10-CM | POA: Diagnosis not present

## 2020-05-02 DIAGNOSIS — G309 Alzheimer's disease, unspecified: Secondary | ICD-10-CM | POA: Diagnosis not present

## 2020-05-02 DIAGNOSIS — C50812 Malignant neoplasm of overlapping sites of left female breast: Secondary | ICD-10-CM | POA: Diagnosis not present

## 2020-05-05 DIAGNOSIS — I10 Essential (primary) hypertension: Secondary | ICD-10-CM | POA: Diagnosis not present

## 2020-05-05 DIAGNOSIS — F0280 Dementia in other diseases classified elsewhere without behavioral disturbance: Secondary | ICD-10-CM | POA: Diagnosis not present

## 2020-05-05 DIAGNOSIS — G309 Alzheimer's disease, unspecified: Secondary | ICD-10-CM | POA: Diagnosis not present

## 2020-05-05 DIAGNOSIS — E639 Nutritional deficiency, unspecified: Secondary | ICD-10-CM | POA: Diagnosis not present

## 2020-05-05 DIAGNOSIS — C50812 Malignant neoplasm of overlapping sites of left female breast: Secondary | ICD-10-CM | POA: Diagnosis not present

## 2020-05-05 DIAGNOSIS — C7800 Secondary malignant neoplasm of unspecified lung: Secondary | ICD-10-CM | POA: Diagnosis not present

## 2020-05-06 DIAGNOSIS — E639 Nutritional deficiency, unspecified: Secondary | ICD-10-CM | POA: Diagnosis not present

## 2020-05-06 DIAGNOSIS — F0280 Dementia in other diseases classified elsewhere without behavioral disturbance: Secondary | ICD-10-CM | POA: Diagnosis not present

## 2020-05-06 DIAGNOSIS — C7800 Secondary malignant neoplasm of unspecified lung: Secondary | ICD-10-CM | POA: Diagnosis not present

## 2020-05-06 DIAGNOSIS — G309 Alzheimer's disease, unspecified: Secondary | ICD-10-CM | POA: Diagnosis not present

## 2020-05-06 DIAGNOSIS — C50812 Malignant neoplasm of overlapping sites of left female breast: Secondary | ICD-10-CM | POA: Diagnosis not present

## 2020-05-06 DIAGNOSIS — I10 Essential (primary) hypertension: Secondary | ICD-10-CM | POA: Diagnosis not present

## 2020-05-07 DIAGNOSIS — G309 Alzheimer's disease, unspecified: Secondary | ICD-10-CM | POA: Diagnosis not present

## 2020-05-09 DIAGNOSIS — I10 Essential (primary) hypertension: Secondary | ICD-10-CM | POA: Diagnosis not present

## 2020-05-09 DIAGNOSIS — C50812 Malignant neoplasm of overlapping sites of left female breast: Secondary | ICD-10-CM | POA: Diagnosis not present

## 2020-05-09 DIAGNOSIS — F0280 Dementia in other diseases classified elsewhere without behavioral disturbance: Secondary | ICD-10-CM | POA: Diagnosis not present

## 2020-05-09 DIAGNOSIS — G309 Alzheimer's disease, unspecified: Secondary | ICD-10-CM | POA: Diagnosis not present

## 2020-05-09 DIAGNOSIS — E639 Nutritional deficiency, unspecified: Secondary | ICD-10-CM | POA: Diagnosis not present

## 2020-05-09 DIAGNOSIS — C7800 Secondary malignant neoplasm of unspecified lung: Secondary | ICD-10-CM | POA: Diagnosis not present

## 2020-05-10 DIAGNOSIS — F0280 Dementia in other diseases classified elsewhere without behavioral disturbance: Secondary | ICD-10-CM | POA: Diagnosis not present

## 2020-05-10 DIAGNOSIS — I10 Essential (primary) hypertension: Secondary | ICD-10-CM | POA: Diagnosis not present

## 2020-05-10 DIAGNOSIS — C7800 Secondary malignant neoplasm of unspecified lung: Secondary | ICD-10-CM | POA: Diagnosis not present

## 2020-05-10 DIAGNOSIS — G309 Alzheimer's disease, unspecified: Secondary | ICD-10-CM | POA: Diagnosis not present

## 2020-05-10 DIAGNOSIS — C50812 Malignant neoplasm of overlapping sites of left female breast: Secondary | ICD-10-CM | POA: Diagnosis not present

## 2020-05-10 DIAGNOSIS — E639 Nutritional deficiency, unspecified: Secondary | ICD-10-CM | POA: Diagnosis not present

## 2020-05-11 DIAGNOSIS — C50812 Malignant neoplasm of overlapping sites of left female breast: Secondary | ICD-10-CM | POA: Diagnosis not present

## 2020-05-11 DIAGNOSIS — E639 Nutritional deficiency, unspecified: Secondary | ICD-10-CM | POA: Diagnosis not present

## 2020-05-11 DIAGNOSIS — I10 Essential (primary) hypertension: Secondary | ICD-10-CM | POA: Diagnosis not present

## 2020-05-11 DIAGNOSIS — F0280 Dementia in other diseases classified elsewhere without behavioral disturbance: Secondary | ICD-10-CM | POA: Diagnosis not present

## 2020-05-11 DIAGNOSIS — G309 Alzheimer's disease, unspecified: Secondary | ICD-10-CM | POA: Diagnosis not present

## 2020-05-11 DIAGNOSIS — C7800 Secondary malignant neoplasm of unspecified lung: Secondary | ICD-10-CM | POA: Diagnosis not present

## 2020-05-12 DIAGNOSIS — C50812 Malignant neoplasm of overlapping sites of left female breast: Secondary | ICD-10-CM | POA: Diagnosis not present

## 2020-05-12 DIAGNOSIS — G309 Alzheimer's disease, unspecified: Secondary | ICD-10-CM | POA: Diagnosis not present

## 2020-05-12 DIAGNOSIS — E639 Nutritional deficiency, unspecified: Secondary | ICD-10-CM | POA: Diagnosis not present

## 2020-05-12 DIAGNOSIS — F0280 Dementia in other diseases classified elsewhere without behavioral disturbance: Secondary | ICD-10-CM | POA: Diagnosis not present

## 2020-05-12 DIAGNOSIS — I10 Essential (primary) hypertension: Secondary | ICD-10-CM | POA: Diagnosis not present

## 2020-05-12 DIAGNOSIS — C7800 Secondary malignant neoplasm of unspecified lung: Secondary | ICD-10-CM | POA: Diagnosis not present

## 2020-05-13 DIAGNOSIS — C7800 Secondary malignant neoplasm of unspecified lung: Secondary | ICD-10-CM | POA: Diagnosis not present

## 2020-05-13 DIAGNOSIS — F0280 Dementia in other diseases classified elsewhere without behavioral disturbance: Secondary | ICD-10-CM | POA: Diagnosis not present

## 2020-05-13 DIAGNOSIS — I10 Essential (primary) hypertension: Secondary | ICD-10-CM | POA: Diagnosis not present

## 2020-05-13 DIAGNOSIS — C50812 Malignant neoplasm of overlapping sites of left female breast: Secondary | ICD-10-CM | POA: Diagnosis not present

## 2020-05-13 DIAGNOSIS — G309 Alzheimer's disease, unspecified: Secondary | ICD-10-CM | POA: Diagnosis not present

## 2020-05-13 DIAGNOSIS — E639 Nutritional deficiency, unspecified: Secondary | ICD-10-CM | POA: Diagnosis not present

## 2020-05-14 DIAGNOSIS — I10 Essential (primary) hypertension: Secondary | ICD-10-CM | POA: Diagnosis not present

## 2020-05-14 DIAGNOSIS — E639 Nutritional deficiency, unspecified: Secondary | ICD-10-CM | POA: Diagnosis not present

## 2020-05-14 DIAGNOSIS — C7800 Secondary malignant neoplasm of unspecified lung: Secondary | ICD-10-CM | POA: Diagnosis not present

## 2020-05-14 DIAGNOSIS — F0280 Dementia in other diseases classified elsewhere without behavioral disturbance: Secondary | ICD-10-CM | POA: Diagnosis not present

## 2020-05-14 DIAGNOSIS — G309 Alzheimer's disease, unspecified: Secondary | ICD-10-CM | POA: Diagnosis not present

## 2020-05-14 DIAGNOSIS — C50812 Malignant neoplasm of overlapping sites of left female breast: Secondary | ICD-10-CM | POA: Diagnosis not present

## 2020-05-15 DIAGNOSIS — E639 Nutritional deficiency, unspecified: Secondary | ICD-10-CM | POA: Diagnosis not present

## 2020-05-15 DIAGNOSIS — F0280 Dementia in other diseases classified elsewhere without behavioral disturbance: Secondary | ICD-10-CM | POA: Diagnosis not present

## 2020-05-15 DIAGNOSIS — G309 Alzheimer's disease, unspecified: Secondary | ICD-10-CM | POA: Diagnosis not present

## 2020-05-15 DIAGNOSIS — C7800 Secondary malignant neoplasm of unspecified lung: Secondary | ICD-10-CM | POA: Diagnosis not present

## 2020-05-15 DIAGNOSIS — I10 Essential (primary) hypertension: Secondary | ICD-10-CM | POA: Diagnosis not present

## 2020-05-15 DIAGNOSIS — C50812 Malignant neoplasm of overlapping sites of left female breast: Secondary | ICD-10-CM | POA: Diagnosis not present

## 2020-05-23 DEATH — deceased

## 2020-07-30 IMAGING — MG DIGITAL DIAGNOSTIC BILATERAL MAMMOGRAM WITH TOMO AND CAD
5 of 10 series · 5 of 30 positions shown · non-contrast
Comparison: Previous exam(s).

ADDENDUM:
The findings and recommendations were also discussed with the [facility] nurse practitioner Ms. Jamiul Stady 04/04/2018 at 11 a.m..
After discussion with Ms. Danger, Faty procedural consent form was faxed
to the [facility]. The patient's power of attorney will fill out
the consent form which will be returned on the day the patient
scheduled biopsies.
CLINICAL DATA: 62-year-old female with a left breast palpable
abnormality.

EXAM:
DIGITAL DIAGNOSTIC BILATERAL MAMMOGRAM WITH CAD AND TOMO
BILATERAL BREAST ULTRASOUND

[R CC synth-2D]
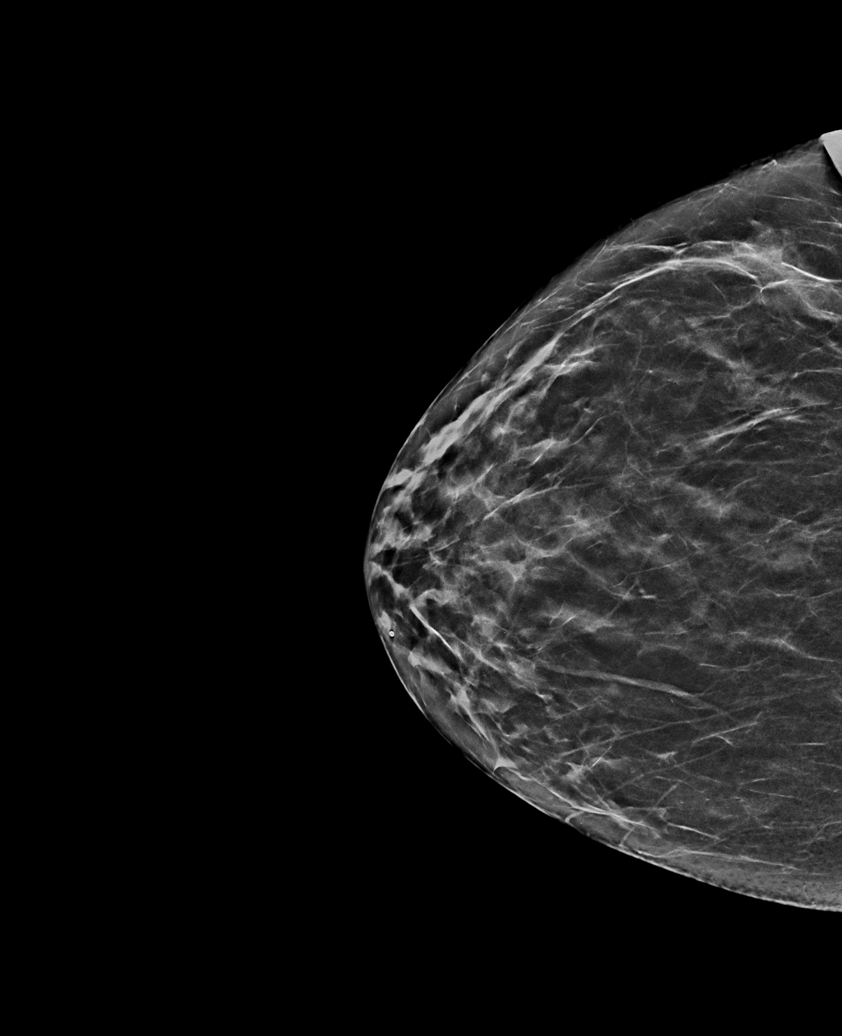

[L MLO synth-2D]
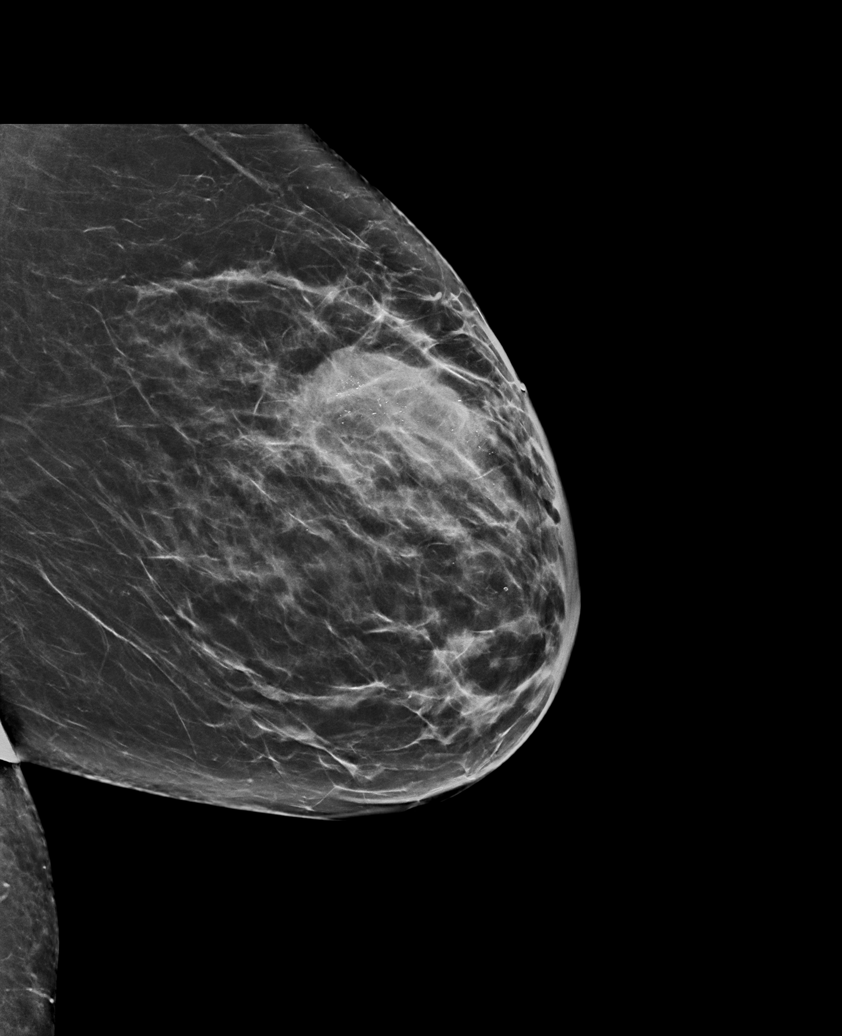

[L CC synth-2D]
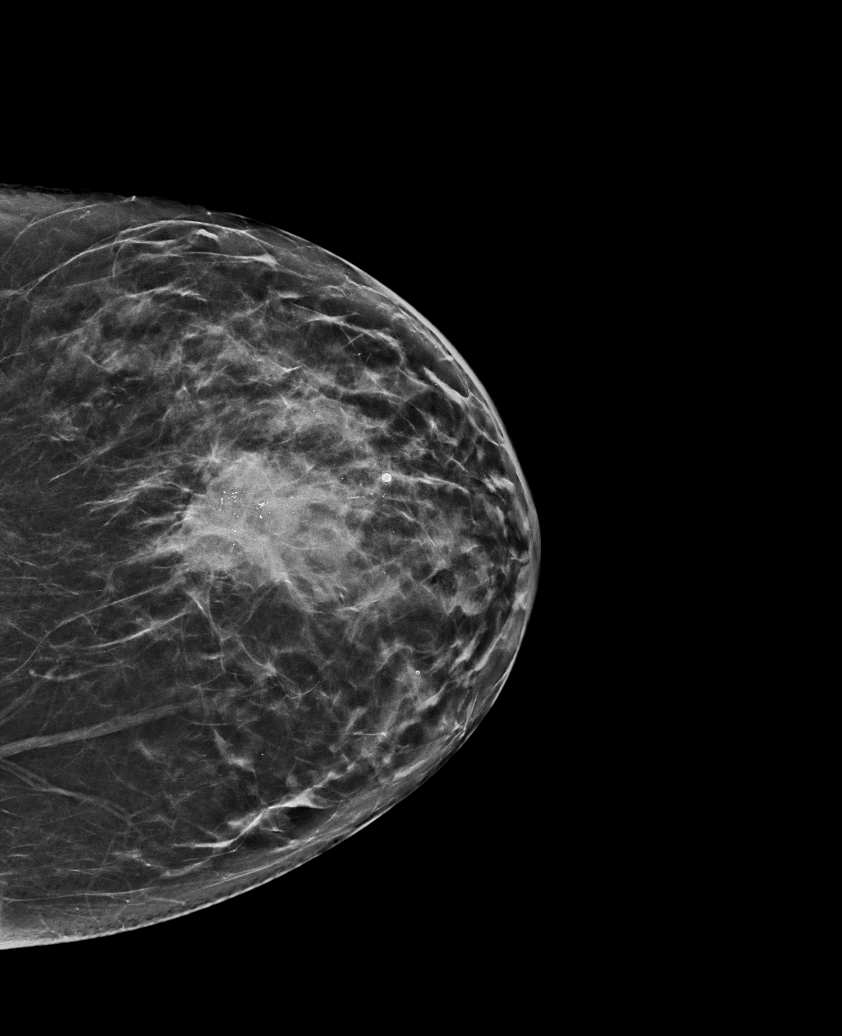

[R MLO synth-2D]
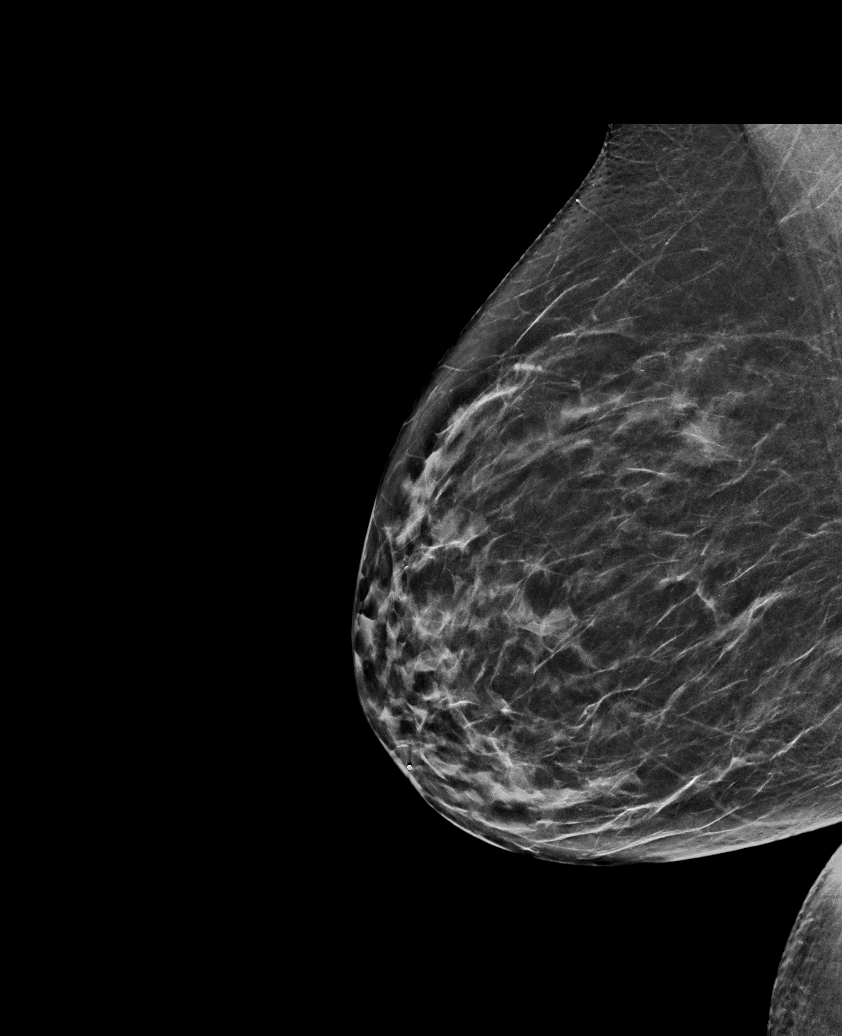

[L TAN synth-2D]
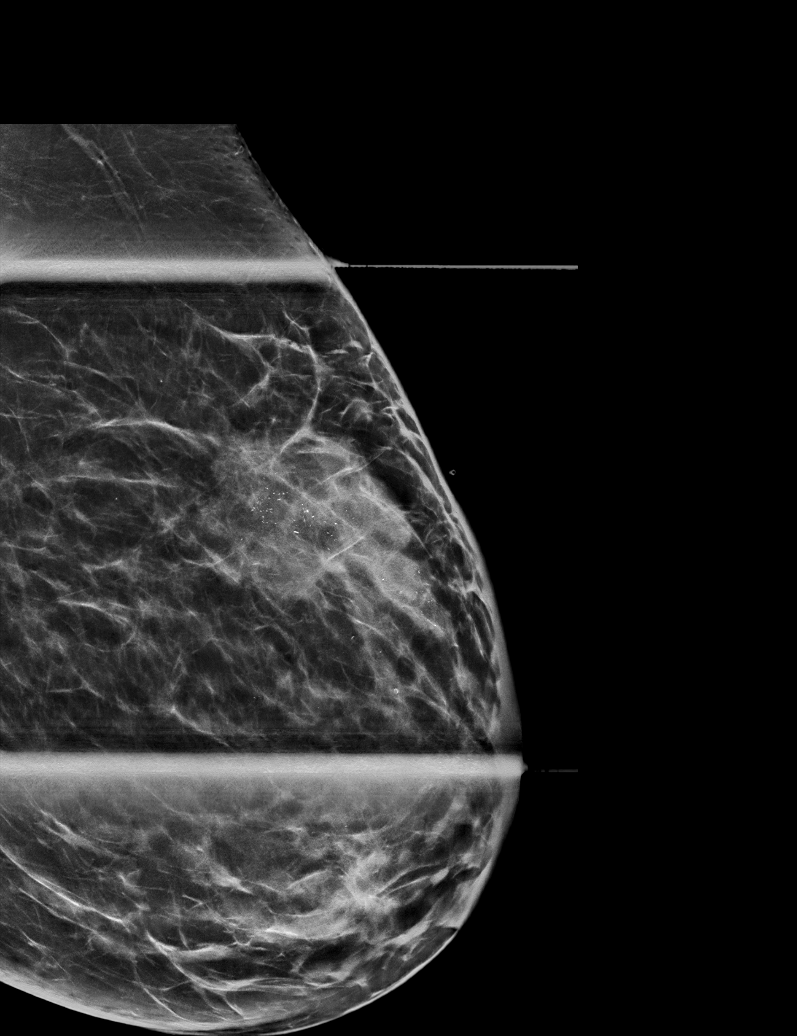

[5 of 30 positions shown; findings below may reference images not displayed]

ACR Breast Density Category b: There are scattered areas of
fibroglandular density.
FINDINGS: There is a large irregular and spiculated mass containing
calcifications in the central upper left breast measuring
approximately 5 cm. There is mild skin thickening involving the
upper-outer quadrant of the left breast. There is an asymmetry in
the upper-outer posterior right breast which demonstrates imaging
features suggestive of normal fibroglandular tissue on the CC
tomograms.

Mammographic images were processed with CAD.

Physical examination of the upper left breast reveals a large firm
fixed mass. Physical examination of the upper-outer right breast
does not reveal any palpable masses.

Targeted ultrasound of the left breast was performed demonstrating a
large irregular mass at the 1 to 2 o'clock position measuring 4.1 x
3.4 x 4.6 cm. This corresponds with the mass seen in the left breast
at mammography. Targeted ultrasound of the left axilla was performed
demonstrating a single lymph node with focal nodular cortical
thickening measuring 1.4 x 1.1 by 1.6 cm.

Targeted ultrasound of the upper-outer right breast was performed
demonstrating an area of focal fibroglandular tissue at the 9 to 10
o'clock position 6 cm from nipple. This corresponds well with the
asymmetry seen in the right breast at mammography.
IMPRESSION: 1. Highly suspicious left breast mass with associated calcifications
measuring 4.6 cm sonographically.

2. Suspicious lymph node in the left axilla with focal nodular
cortical thickening.

3.  No findings of malignancy in the right breast.

RECOMMENDATION:
1. Recommend ultrasound-guided biopsy of the palpable mass in the
left breast at the 1 to 2 o'clock position.

2. Recommend ultrasound-guided biopsy of the abnormal lymph node in
the left axilla.

The findings and recommendations were discussed with the patient's
nurse at the [facility], Ms. Kimani Fisch, at [DATE] a.m.
04/04/2018. The patient's nurse will contact social services as well
as notify the overseeing nurse practitioner so that the proper
consent for the procedure may be obtained (as it is currently
unclear whether the patient is able to sign her own consent form).

I have discussed the findings and recommendations with the patient.
Results were also provided in writing at the conclusion of the
visit. If applicable, a reminder letter will be sent to the patient
regarding the next appointment.

BI-RADS CATEGORY  5: Highly suggestive of malignancy.
